# Patient Record
Sex: Female | Born: 1960 | Race: White | Hispanic: No | Marital: Single | Smoking: Current every day smoker
Health system: Southern US, Community
[De-identification: ages and names within clinical notes are randomized; demographics above are authoritative.]

## PROBLEM LIST (undated history)

## (undated) DIAGNOSIS — I2510 Atherosclerotic heart disease of native coronary artery without angina pectoris: Secondary | ICD-10-CM

## (undated) DIAGNOSIS — G709 Myoneural disorder, unspecified: Secondary | ICD-10-CM

## (undated) DIAGNOSIS — M1990 Unspecified osteoarthritis, unspecified site: Secondary | ICD-10-CM

## (undated) DIAGNOSIS — M797 Fibromyalgia: Secondary | ICD-10-CM

## (undated) DIAGNOSIS — K861 Other chronic pancreatitis: Secondary | ICD-10-CM

## (undated) DIAGNOSIS — E785 Hyperlipidemia, unspecified: Secondary | ICD-10-CM

## (undated) DIAGNOSIS — F329 Major depressive disorder, single episode, unspecified: Secondary | ICD-10-CM

## (undated) DIAGNOSIS — K5790 Diverticulosis of intestine, part unspecified, without perforation or abscess without bleeding: Secondary | ICD-10-CM

## (undated) DIAGNOSIS — R319 Hematuria, unspecified: Secondary | ICD-10-CM

## (undated) DIAGNOSIS — K589 Irritable bowel syndrome without diarrhea: Secondary | ICD-10-CM

## (undated) DIAGNOSIS — K635 Polyp of colon: Secondary | ICD-10-CM

## (undated) DIAGNOSIS — F419 Anxiety disorder, unspecified: Secondary | ICD-10-CM

## (undated) DIAGNOSIS — F32A Depression, unspecified: Secondary | ICD-10-CM

## (undated) DIAGNOSIS — K219 Gastro-esophageal reflux disease without esophagitis: Secondary | ICD-10-CM

## (undated) HISTORY — PX: OOPHORECTOMY: SHX86

## (undated) HISTORY — DX: Depression, unspecified: F32.A

## (undated) HISTORY — DX: Myoneural disorder, unspecified: G70.9

## (undated) HISTORY — DX: Anxiety disorder, unspecified: F41.9

## (undated) HISTORY — DX: Unspecified osteoarthritis, unspecified site: M19.90

## (undated) HISTORY — PX: ABDOMINAL HYSTERECTOMY: SHX81

## (undated) HISTORY — DX: Other chronic pancreatitis: K86.1

## (undated) HISTORY — DX: Diverticulosis of intestine, part unspecified, without perforation or abscess without bleeding: K57.90

## (undated) HISTORY — DX: Hematuria, unspecified: R31.9

## (undated) HISTORY — DX: Hyperlipidemia, unspecified: E78.5

## (undated) HISTORY — DX: Polyp of colon: K63.5

## (undated) HISTORY — PX: APPENDECTOMY: SHX54

## (undated) HISTORY — DX: Irritable bowel syndrome, unspecified: K58.9

## (undated) HISTORY — PX: TONSILLECTOMY AND ADENOIDECTOMY: SHX28

## (undated) HISTORY — DX: Major depressive disorder, single episode, unspecified: F32.9

## (undated) HISTORY — DX: Atherosclerotic heart disease of native coronary artery without angina pectoris: I25.10

---

## 2005-02-05 ENCOUNTER — Ambulatory Visit: Payer: Self-pay | Admitting: Family Medicine

## 2008-02-25 ENCOUNTER — Ambulatory Visit: Payer: Self-pay

## 2009-11-16 ENCOUNTER — Ambulatory Visit: Payer: Self-pay

## 2009-11-23 ENCOUNTER — Ambulatory Visit: Payer: Self-pay

## 2010-06-06 ENCOUNTER — Ambulatory Visit: Payer: Self-pay

## 2010-07-12 DIAGNOSIS — G894 Chronic pain syndrome: Secondary | ICD-10-CM | POA: Insufficient documentation

## 2010-08-14 ENCOUNTER — Ambulatory Visit: Payer: Self-pay

## 2011-08-21 ENCOUNTER — Ambulatory Visit: Payer: Self-pay

## 2011-08-29 ENCOUNTER — Ambulatory Visit: Payer: Self-pay

## 2012-02-28 ENCOUNTER — Ambulatory Visit: Payer: Self-pay

## 2013-07-03 ENCOUNTER — Ambulatory Visit: Payer: Self-pay

## 2015-05-12 ENCOUNTER — Telehealth: Payer: Self-pay | Admitting: Family Medicine

## 2015-05-12 ENCOUNTER — Encounter: Payer: Self-pay | Admitting: Family Medicine

## 2015-05-12 ENCOUNTER — Ambulatory Visit: Payer: Self-pay | Admitting: Family Medicine

## 2015-05-12 ENCOUNTER — Ambulatory Visit (INDEPENDENT_AMBULATORY_CARE_PROVIDER_SITE_OTHER): Payer: Self-pay | Admitting: Family Medicine

## 2015-05-12 VITALS — BP 128/78 | HR 99 | Temp 98.1°F | Resp 16 | Ht 64.0 in | Wt 144.7 lb

## 2015-05-12 DIAGNOSIS — M545 Low back pain, unspecified: Secondary | ICD-10-CM | POA: Insufficient documentation

## 2015-05-12 DIAGNOSIS — G8929 Other chronic pain: Secondary | ICD-10-CM

## 2015-05-12 DIAGNOSIS — J358 Other chronic diseases of tonsils and adenoids: Secondary | ICD-10-CM

## 2015-05-12 DIAGNOSIS — F17210 Nicotine dependence, cigarettes, uncomplicated: Secondary | ICD-10-CM

## 2015-05-12 DIAGNOSIS — F411 Generalized anxiety disorder: Secondary | ICD-10-CM | POA: Insufficient documentation

## 2015-05-12 DIAGNOSIS — M25559 Pain in unspecified hip: Secondary | ICD-10-CM

## 2015-05-12 DIAGNOSIS — M797 Fibromyalgia: Secondary | ICD-10-CM | POA: Insufficient documentation

## 2015-05-12 MED ORDER — TIZANIDINE HCL 4 MG PO TABS: 4.0000 mg | ORAL_TABLET | Freq: Three times a day (TID) | ORAL | Status: DC | PRN

## 2015-05-12 MED ORDER — GABAPENTIN 300 MG PO CAPS: 300.0000 mg | ORAL_CAPSULE | Freq: Three times a day (TID) | ORAL | Status: DC

## 2015-05-12 MED ORDER — HYDROCODONE-ACETAMINOPHEN 7.5-325 MG PO TABS: 1.0000 | ORAL_TABLET | Freq: Three times a day (TID) | ORAL | Status: DC | PRN

## 2015-05-12 MED ORDER — ALPRAZOLAM 1 MG PO TABS: 1.0000 mg | ORAL_TABLET | Freq: Three times a day (TID) | ORAL | Status: DC | PRN

## 2015-05-12 NOTE — Telephone Encounter (Signed)
Pt is asking if you are getting her refill for her pain medication

## 2015-05-12 NOTE — Progress Notes (Signed)
Name: Patricia Rollins   MRN: [id number]    DOB: September 09, 1961   Date:05/12/2015       Progress Note  Subjective  Chief Complaint  Chief Complaint  Patient presents with  . Medication Refill    patient states she needs all (4) of her medication refilled. patient needs enough for 31 days in July & August.    HPI  Joint/Muscle Pain: Patient complains of arthralgias for which has been present for several years. Pain is located in lower back, is described as aching, shooting and throbbing, and is constant .  Associated symptoms include: decreased range of motion.  The patient has been using hydrocodone-aceto 7.5-325mg , tizanidine 4mg , gabapentin 300mg .  Related to injury:   not applicable. Here today for refills as PCP out of office.   Anxiety: Patient complains of anxiety disorder and panic attacks.  She has the following symptoms: difficulty concentrating, fatigue, feelings of losing control, insomnia, irritable, palpitations, racing thoughts, shortness of breath. Onset of symptoms was approximately several years ago, stable since that time. She denies current suicidal and homicidal ideation. Family history significant for anxiety and depression.Possible organic causes contributing are: none. Risk factors: previous episode of depression Previous treatment includes Xanax and medication.  She complains of the following side effects from the treatment: none.   Smoking: Trying to quit for the 4th time now, signed up with 11-1798 Quit Smoking hotline, has nicotine lozenges and patches ready. Motivated to stop smoking as she sees her friends' health deteriorating due to smoking, plus cost benefit, plus she does not want her grandchildren to know she smokes.  Tonsil white spot: For many months has had white spots back of throat, no pain, no coughing, no hoarseness.    Past Medical History  Diagnosis Date  . Anxiety   . Depression   . Neuromuscular disorder   . Arthritis     patient has bilateral bursitis  in hips    Past Surgical History  Procedure Laterality Date  . Abdominal hysterectomy    . Tonsillectomy and adenoidectomy Bilateral     History reviewed. No pertinent family history.  History   Social History  . Marital Status: Single    Spouse Name: N/A  . Number of Children: N/A  . Years of Education: N/A   Occupational History  . Not on file.   Social History Main Topics  . Smoking status: Current Every Day Smoker -- 1.00 packs/day    Types: Cigarettes  . Smokeless tobacco: Not on file  . Alcohol Use: No  . Drug Use: No  . Sexual Activity: No   Other Topics Concern  . Not on file   Social History Narrative  . No narrative on file     Current outpatient prescriptions:  .  ALPRAZolam (XANAX) 1 MG tablet, Take 1 tablet (1 mg total) by mouth 3 (three) times daily as needed for anxiety., Disp: 90 tablet, Rfl: 2 .  gabapentin (NEURONTIN) 300 MG capsule, Take 1 capsule (300 mg total) by mouth 3 (three) times daily., Disp: 90 capsule, Rfl: 2 .  HYDROcodone-acetaminophen (NORCO) 7.5-325 MG per tablet, Take 1 tablet by mouth every 8 (eight) hours as needed., Disp: 93 tablet, Rfl: 0 .  tiZANidine (ZANAFLEX) 4 MG tablet, Take 1 tablet (4 mg total) by mouth every 8 (eight) hours as needed., Disp: 90 tablet, Rfl: 2 .  HYDROcodone-acetaminophen (NORCO) 7.5-325 MG per tablet, Take 1 tablet by mouth every 8 (eight) hours as needed for moderate pain., Disp: 93  tablet, Rfl: 0 .  HYDROcodone-acetaminophen (NORCO) 7.5-325 MG per tablet, Take 1 tablet by mouth every 8 (eight) hours as needed for moderate pain., Disp: 90 tablet, Rfl: 0  Allergies  Allergen Reactions  . Sulfa Antibiotics Nausea Only     ROS  CONSTITUTIONAL: No significant weight changes, fever, chills, weakness or fatigue.  HEENT:  - Eyes: No visual changes.  - Ears: No auditory changes. No pain.  - Nose: No sneezing, congestion, runny nose. - Throat: No sore throat. No changes in swallowing. SKIN: No rash or  itching.  CARDIOVASCULAR: No chest pain, chest pressure or chest discomfort. No palpitations or edema.  RESPIRATORY: No shortness of breath, cough or sputum.  GASTROINTESTINAL: No anorexia, nausea, vomiting. No changes in bowel habits. No abdominal pain or blood.  GENITOURINARY: No dysuria. No frequency. No discharge.  NEUROLOGICAL: No headache, dizziness, syncope, paralysis, ataxia, numbness or tingling in the extremities. No memory changes. No change in bowel or bladder control.  MUSCULOSKELETAL: Yes joint pain. No muscle pain. HEMATOLOGIC: No anemia, bleeding or bruising.  LYMPHATICS: No enlarged lymph nodes.  PSYCHIATRIC: No change in mood. No change in sleep pattern.  ENDOCRINOLOGIC: No reports of sweating, cold or heat intolerance. No polyuria or polydipsia.   Objective  Filed Vitals:   05/12/15 1511  BP: 128/78  Pulse: 99  Temp: 98.1 F (36.7 C)  TempSrc: Oral  Resp: 16  Height: 5\' 4"  (1.626 m)  Weight: 144 lb 11.2 oz (65.635 kg)  SpO2: 97%   Body mass index is 24.83 kg/(m^2).  Physical Exam  Constitutional: Patient appears well-developed and well-nourished. In no distress.  HEENT:  - Head: Normocephalic and atraumatic.  - Ears: Bilateral TMs gray, no erythema or effusion - Nose: Nasal mucosa moist - Mouth/Throat: Oropharynx is clear and moist. No tonsillar hypertrophy or erythema. Some tonsilar stones present. No post nasal drainage.  - Eyes: Conjunctivae clear, EOM movements normal. PERRLA. No scleral icterus.  Neck: Normal range of motion. Neck supple. No JVD present. No thyromegaly present.  Cardiovascular: Normal rate, regular rhythm and normal heart sounds.  No murmur heard.  Pulmonary/Chest: Effort normal and breath sounds normal. No respiratory distress. Musculoskeletal: Normal range of motion bilateral UE and LE, no joint effusions. Lumbar spine with no palpable step off with some paraspinal muscle tenderness. Peripheral vascular: Bilateral LE no  edema. Neurological: CN II-XII grossly intact with no focal deficits. Alert and oriented to person, place, and time. Coordination, balance, strength, speech and gait are normal.  Skin: Skin is warm and dry. No rash noted. No erythema.  Psychiatric: Patient has a normal mood and affect. Behavior is normal in office today. Judgment and thought content normal in office today.   Assessment & Plan  1. Chronic lumbar pain Stable exam and findings. Refilled her medication. She was unable to provide sufficient urine for UDS.  - tiZANidine (ZANAFLEX) 4 MG tablet; Take 1 tablet (4 mg total) by mouth every 8 (eight) hours as needed.  Dispense: 90 tablet; Refill: 2 - gabapentin (NEURONTIN) 300 MG capsule; Take 1 capsule (300 mg total) by mouth 3 (three) times daily.  Dispense: 90 capsule; Refill: 2 - HYDROcodone-acetaminophen (NORCO) 7.5-325 MG per tablet; Take 1 tablet by mouth every 8 (eight) hours as needed.  Dispense: 93 tablet; Refill: 0 - HYDROcodone-acetaminophen (NORCO) 7.5-325 MG per tablet; Take 1 tablet by mouth every 8 (eight) hours as needed for moderate pain.  Dispense: 93 tablet; Refill: 0 - HYDROcodone-acetaminophen (NORCO) 7.5-325 MG per tablet; Take 1  tablet by mouth every 8 (eight) hours as needed for moderate pain.  Dispense: 90 tablet; Refill: 0  2. Fibromyalgia Stable findings, refilled medications.  - tiZANidine (ZANAFLEX) 4 MG tablet; Take 1 tablet (4 mg total) by mouth every 8 (eight) hours as needed.  Dispense: 90 tablet; Refill: 2 - gabapentin (NEURONTIN) 300 MG capsule; Take 1 capsule (300 mg total) by mouth 3 (three) times daily.  Dispense: 90 capsule; Refill: 2 - HYDROcodone-acetaminophen (NORCO) 7.5-325 MG per tablet; Take 1 tablet by mouth every 8 (eight) hours as needed.  Dispense: 93 tablet; Refill: 0 - HYDROcodone-acetaminophen (NORCO) 7.5-325 MG per tablet; Take 1 tablet by mouth every 8 (eight) hours as needed for moderate pain.  Dispense: 93 tablet; Refill: 0 -  HYDROcodone-acetaminophen (NORCO) 7.5-325 MG per tablet; Take 1 tablet by mouth every 8 (eight) hours as needed for moderate pain.  Dispense: 90 tablet; Refill: 0  3. Generalized anxiety disorder Stable findings, refilled medication.  - ALPRAZolam (XANAX) 1 MG tablet; Take 1 tablet (1 mg total) by mouth 3 (three) times daily as needed for anxiety.  Dispense: 90 tablet; Refill: 2  4. Chronic hip pain, unspecified laterality Stable findings, refilled medication.  - tiZANidine (ZANAFLEX) 4 MG tablet; Take 1 tablet (4 mg total) by mouth every 8 (eight) hours as needed.  Dispense: 90 tablet; Refill: 2 - gabapentin (NEURONTIN) 300 MG capsule; Take 1 capsule (300 mg total) by mouth 3 (three) times daily.  Dispense: 90 capsule; Refill: 2 - HYDROcodone-acetaminophen (NORCO) 7.5-325 MG per tablet; Take 1 tablet by mouth every 8 (eight) hours as needed.  Dispense: 93 tablet; Refill: 0 - HYDROcodone-acetaminophen (NORCO) 7.5-325 MG per tablet; Take 1 tablet by mouth every 8 (eight) hours as needed for moderate pain.  Dispense: 93 tablet; Refill: 0 - HYDROcodone-acetaminophen (NORCO) 7.5-325 MG per tablet; Take 1 tablet by mouth every 8 (eight) hours as needed for moderate pain.  Dispense: 90 tablet; Refill: 0  5. Moderate cigarette smoker (10-19 per day) The patient has been counseled on smoking cessation benefits, goals, strategies and available over the counter and prescription medications that may help them in their efforts.  Options discussed include Nicoderm patches, Wellbutrin and Chantix.  The patient voices understanding their increased risk of cardiovascular and pulmonary diseases with continued use of tobacco products.   6. Tonsil stone Monitor symptoms.

## 2015-07-17 ENCOUNTER — Emergency Department
Admission: EM | Admit: 2015-07-17 | Discharge: 2015-07-17 | Disposition: A | Discharge status: Home / Self Care | Payer: Self-pay | Attending: Emergency Medicine | Admitting: Emergency Medicine

## 2015-07-17 ENCOUNTER — Emergency Department: Payer: Self-pay

## 2015-07-17 DIAGNOSIS — Y9289 Other specified places as the place of occurrence of the external cause: Secondary | ICD-10-CM | POA: Insufficient documentation

## 2015-07-17 DIAGNOSIS — S40012A Contusion of left shoulder, initial encounter: Secondary | ICD-10-CM | POA: Insufficient documentation

## 2015-07-17 DIAGNOSIS — S42032A Displaced fracture of lateral end of left clavicle, initial encounter for closed fracture: Secondary | ICD-10-CM

## 2015-07-17 DIAGNOSIS — S42035A Nondisplaced fracture of lateral end of left clavicle, initial encounter for closed fracture: Secondary | ICD-10-CM | POA: Insufficient documentation

## 2015-07-17 DIAGNOSIS — Y998 Other external cause status: Secondary | ICD-10-CM | POA: Insufficient documentation

## 2015-07-17 DIAGNOSIS — W1839XA Other fall on same level, initial encounter: Secondary | ICD-10-CM | POA: Insufficient documentation

## 2015-07-17 DIAGNOSIS — Z79899 Other long term (current) drug therapy: Secondary | ICD-10-CM | POA: Insufficient documentation

## 2015-07-17 DIAGNOSIS — Y9389 Activity, other specified: Secondary | ICD-10-CM | POA: Insufficient documentation

## 2015-07-17 DIAGNOSIS — S20212A Contusion of left front wall of thorax, initial encounter: Secondary | ICD-10-CM | POA: Insufficient documentation

## 2015-07-17 DIAGNOSIS — Z720 Tobacco use: Secondary | ICD-10-CM | POA: Insufficient documentation

## 2015-07-17 HISTORY — DX: Fibromyalgia: M79.7

## 2015-07-17 MED ORDER — OXYCODONE-ACETAMINOPHEN 5-325 MG PO TABS
1.0000 | ORAL_TABLET | Freq: Once | ORAL | Status: AC
Start: 2015-07-17 — End: 2015-07-17
  Administered 2015-07-17: 1 via ORAL
  Filled 2015-07-17: qty 1

## 2015-07-17 MED ORDER — PROMETHAZINE HCL 12.5 MG PO TABS: 12.5000 mg | ORAL_TABLET | Freq: Four times a day (QID) | ORAL | Status: DC | PRN

## 2015-07-17 MED ORDER — PROMETHAZINE HCL 25 MG PO TABS
25.0000 mg | ORAL_TABLET | Freq: Once | ORAL | Status: AC
  Administered 2015-07-17: 25 mg via ORAL
  Filled 2015-07-17: qty 1

## 2015-07-17 MED ORDER — OXYCODONE-ACETAMINOPHEN 5-325 MG PO TABS: 1.0000 | ORAL_TABLET | Freq: Four times a day (QID) | ORAL | Status: DC | PRN

## 2015-07-17 NOTE — ED Notes (Signed)
MD at bedside for eval.

## 2015-07-17 NOTE — ED Provider Notes (Signed)
[facility] Emergency Department Provider Note  ____________________________________________  Time seen: Approximately 244 AM  I have reviewed the triage vital signs and the nursing notes.   HISTORY  Chief Complaint Shoulder Pain    HPI Patricia Rollins is a 54 y.o. female who fell on her shoulder on Wednesday. The patient reports that since the fall she has been having a lot of pain in her shoulder and it has not been getting better. The patient reports that she is here because she feels as though she may have dislocated her shoulder. The patient reports she has been putting ice on her shoulder and had it in the sling as well as taking some ibuprofen but it has not been helping. The patient feels as though she cannot move her left arm because of pain and it hurts trying to hold things. The patient reports he was moving a chair when she fell. Her pain is a 10 out of 10 in intensity. She also reports pain at the top of her shoulder near her shoulder blade. She reports that she hit her head a little bit but it was not a very strong impact she only noticed it days after the injury when she felt a knot on her head. The patient denies any loss of consciousness with this as well.   Past Medical History  Diagnosis Date  . Anxiety   . Depression   . Neuromuscular disorder   . Arthritis     patient has bilateral bursitis in hips  . Fibromyalgia   . Anxiety     Patient Active Problem List   Diagnosis Date Noted  . Chronic lumbar pain 05/12/2015  . Fibromyalgia 05/12/2015  . Generalized anxiety disorder 05/12/2015    Past Surgical History  Procedure Laterality Date  . Abdominal hysterectomy    . Tonsillectomy and adenoidectomy Bilateral   . Oophorectomy    . Appendectomy      Current Outpatient Rx  Name  Route  Sig  Dispense  Refill  . ALPRAZolam (XANAX) 1 MG tablet   Oral   Take 1 tablet (1 mg total) by mouth 3 (three) times daily as needed for anxiety.    90 tablet   2   . gabapentin (NEURONTIN) 300 MG capsule   Oral   Take 1 capsule (300 mg total) by mouth 3 (three) times daily.   90 capsule   2   . HYDROcodone-acetaminophen (NORCO) 7.5-325 MG per tablet   Oral   Take 1 tablet by mouth every 8 (eight) hours as needed.   93 tablet   0     Refill 05/13/15   . tiZANidine (ZANAFLEX) 4 MG tablet   Oral   Take 1 tablet (4 mg total) by mouth every 8 (eight) hours as needed.   90 tablet   2   . HYDROcodone-acetaminophen (NORCO) 7.5-325 MG per tablet   Oral   Take 1 tablet by mouth every 8 (eight) hours as needed for moderate pain.   93 tablet   0     Refill 06/13/15   . HYDROcodone-acetaminophen (NORCO) 7.5-325 MG per tablet   Oral   Take 1 tablet by mouth every 8 (eight) hours as needed for moderate pain.   90 tablet   0     Refill 07/14/15   . oxyCODONE-acetaminophen (ROXICET) 5-325 MG per tablet   Oral   Take 1 tablet by mouth every 6 (six) hours as needed.   12 tablet  0   . promethazine (PHENERGAN) 12.5 MG tablet   Oral   Take 1 tablet (12.5 mg total) by mouth every 6 (six) hours as needed for nausea or vomiting.   12 tablet   0     Allergies Sulfa antibiotics and Prozac   History reviewed. No pertinent family history.  Social History Social History  Substance Use Topics  . Smoking status: Current Every Day Smoker -- 1.00 packs/day    Types: Cigarettes  . Smokeless tobacco: None  . Alcohol Use: None    Review of Systems Constitutional: No fever/chills Eyes: No visual changes. ENT: No sore throat. Cardiovascular: Denies chest pain. Respiratory: Denies shortness of breath. Gastrointestinal: No abdominal pain.  No nausea, no vomiting.  No diarrhea.  No constipation. Genitourinary: Negative for dysuria. Musculoskeletal: Left shoulder pain Skin: Negative for rash. Neurological: Negative for headaches, focal weakness or numbness.  10-point ROS otherwise  negative.  ____________________________________________   PHYSICAL EXAM:  VITAL SIGNS: ED Triage Vitals  Enc Vitals Group     BP 07/17/15 0130 96/63 mmHg     Pulse Rate 07/17/15 0130 82     Resp 07/17/15 0130 16     Temp 07/17/15 0130 97.6 F (36.4 C)     Temp Source 07/17/15 0130 Oral     SpO2 07/17/15 0130 96 %     Weight 07/17/15 0130 145 lb (65.772 kg)     Height 07/17/15 0130 5\' 4"  (1.626 m)     Head Cir --      Peak Flow --      Pain Score 07/17/15 0131 10     Pain Loc --      Pain Edu? --      Excl. in [place]? --     Constitutional: Alert and oriented. Well appearing and in moderate distress. Eyes: Conjunctivae are normal. PERRL. EOMI. Head: Atraumatic. Nose: No congestion/rhinnorhea. Mouth/Throat: Mucous membranes are moist.  Oropharynx non-erythematous. Cardiovascular: Normal rate, regular rhythm. Grossly normal heart sounds.  Good peripheral circulation. Respiratory: Normal respiratory effort.  No retractions. Lungs CTAB. Gastrointestinal: Soft and nontender. No distention.  Musculoskeletal: Pain to left shoulder to palpation and with range of motion. Patient has some bruising over her shoulder and her left anterior chest. Neurologic:  Normal speech and language.  Skin:  Bruising of left shoulder and left anterior chest wall. Psychiatric: Mood and affect are normal.   ____________________________________________   LABS (all labs ordered are listed, but only abnormal results are displayed)  Labs Reviewed - No data to display ____________________________________________  EKG  None ____________________________________________  RADIOLOGY  Left shoulder x-ray: Acute comminuted nondisplaced distal clavicle fracture no dislocation ____________________________________________   PROCEDURES  Procedure(s) performed: None  Critical Care performed: No  ____________________________________________   INITIAL IMPRESSION / ASSESSMENT AND PLAN / ED  COURSE  Pertinent labs & imaging results that were available during my care of the patient were reviewed by me and considered in my medical decision making (see chart for details).  The patient was placed in a left shoulder immobilizer and given a dose of Percocet and Phenergan. I informed the patient that she does need to follow up with orthopedic surgery. She reports that she does not have insurance or she is unsure if she can follow-up but I told her that she may need surgery and is important for her to follow up. Otherwise the patient has no further complaints she has good strength in her left hand. The patient be discharged home to follow-up with  orthopedic surgery. ____________________________________________   FINAL CLINICAL IMPRESSION(S) / ED DIAGNOSES  Final diagnoses:  Closed fracture of distal clavicle, left, initial encounter      Loney Hering, MD 07/17/15 [phone]

## 2015-07-17 NOTE — ED Notes (Signed)
Patient reports that fell while moving a chair and landed on her shoulder.  Patient reports pain as a "10" and has been taking 200 mg Advil every 4 hours for pain.  Reports that pain is less when arm in sling but movement makes pain increase.

## 2015-07-17 NOTE — Discharge Instructions (Signed)
Clavicle Fracture The clavicle, also called the collarbone, is the long bone that connects your shoulder to your rib cage. You can feel your collarbone at the top of your shoulders and rib cage. A clavicle fracture is a broken clavicle. It is a common injury that can happen at any age.  CAUSES Common causes of a clavicle fracture include:  A direct blow to your shoulder.  A car accident.  A fall, especially if you try to break your fall with an outstretched arm. RISK FACTORS You may be at increased risk if:  You are younger than 25 years or older than 2 years. Most clavicle fractures happen to people who are younger than 25 years.  You are a female.  You play contact sports. SIGNS AND SYMPTOMS A fractured clavicle is painful. It also makes it hard to move your arm. Other signs and symptoms may include:  A shoulder that drops downward and forward.  Pain when trying to lift your shoulder.  Bruising, swelling, and tenderness over your clavicle.  A grinding noise when you try to move your shoulder.  A bump over your clavicle. DIAGNOSIS Your health care provider can usually diagnose a clavicle fracture by asking about your injury and examining your shoulder and clavicle. He or she may take an X-ray to determine the position of your clavicle. TREATMENT Treatment depends on the position of your clavicle after the fracture:  If the broken ends of the bone are not out of place, your health care provider may put your arm in a sling or wrap a support bandage around your chest (figure-of-eight wrap).  If the broken ends of the bone are out of place, you may need surgery. Surgery may involve placing screws, pins, or plates to keep your clavicle stable while it heals. Healing may take about 3 months. When your health care provider thinks your fracture has healed enough, you may have to do physical therapy to regain normal movement and build up your arm strength. HOME CARE INSTRUCTIONS    Apply ice to the injured area:  Put ice in a plastic bag.  Place a towel between your skin and the bag.  Leave the ice on for 20 minutes, 2-3 times a day.  If you have a wrap or splint:  Wear it all the time, and remove it only to take a bath or shower.  When you bathe or shower, keep your shoulder in the same position as when the sling or wrap is on.  Do not lift your arm.  If you have a figure-of-eight wrap:  Another person must tighten it every day.  It should be tight enough to hold your shoulders back.  Allow enough room to place your index finger between your body and the strap.  Loosen the wrap immediately if you feel numbness or tingling in your hands.  Only take medicines as directed by your health care provider.  Avoid activities that make the injury or pain worse for 4-6 weeks after surgery.  Keep all follow-up appointments. SEEK MEDICAL CARE IF:  Your medicine is not helping to relieve pain and swelling. SEEK IMMEDIATE MEDICAL CARE IF:  Your arm is numb, cold, or pale, even when the splint is loose. MAKE SURE YOU:   Understand these instructions.  Will watch your condition.  Will get help right away if you are not doing well or get worse. Document Released: 08/08/2005 Document Revised: 11/03/2013 Document Reviewed: 09/21/2013 [facility] Patient Information 2015 [place], [place]. This information is  not intended to replace advice given to you by your health care provider. Make sure you discuss any questions you have with your health care provider.  Clavicle Fracture (Distal End) with Rehab Distal clavicle fractures are breaks in the collarbone (clavicle) that occur in the outer third portion of the bone, near the joint between the collarbone and one of the shoulder bones (acromion). These breaks (fractures) may be complete or incomplete. If the fracture extends into the joint at the top of the shoulder, it may also cause damage to the ligaments there  (acromioclavicular and coracoclavicular). These two ligaments are responsible for attaching the collarbone to the shoulder bone. SYMPTOMS   Pain, tenderness, and swelling on top of the shoulder.  Visible deformity or bump over the fracture site, if the fracture is complete and the bone fragments separate enough to distort the appearance of the top of the shoulder.  Bruising (contusion) at the site of injury (usually within 48 hours).  Loss of strength, or pain with use of the affected arm.  Sometimes, numbness or coldness in the shoulder and arm on the affected side if the blood supply is impaired.  Uncommonly, shortness of breath or difficulty breathing. CAUSES  Distal clavicle fractures are usually caused by direct hit (trauma) to the area of injury. The injury may also occur from indirect trauma, such as falling on an outstretched hand (uncommon). RISK INCREASES WITH:  Sports that require contact or collision (football, soccer, hockey, rugby).  Sports with high risk of falling on the shoulder (rodeo riding, mountain bike riding, cycling).  Previous shoulder injury.  Improperly fitted or padded protective equipment.  History of bone or joint disease (osteoporosis, bone tumors). PREVENTION  Warm up and stretch properly before activity.  Maintain physical fitness:  Strength, flexibility, and endurance.  Cardiovascular fitness.  Wear properly fitted and padded protective equipment.  Learn and use proper technique, and have a coach correct improper technique (including falling). PROGNOSIS  If treated properly, distal clavicle fractures usually can be expected to heal. However, surgery may be needed.  RELATED COMPLICATIONS   Pressure on or injury to nearby nerves, ligaments, tendons, muscles, blood vessels, or other tissues.  Weakness and fatigue of the arm or shoulder (uncommon).  Fracture fails to heal (nonunion).  Fracture heals in improper position  (malunion).  Arthritis, pain, and inflammation of the acromioclavicular (AC) joint.  Longer healing time and vulnerable to recurring injury, if usual activities are resumed too soon.  Excessive scar tissue at the fracture site, including excessive bone formation, causing pressure on nerves and blood vessels in the neck or armpit. This may lead to pain, numbness, and tingling in the neck, shoulder, arms, and hands.  Infection if the bone breaks through the skin (open fracture), or at the incision if surgery is performed.  Persistent bump (prominence) at the fracture site.  Vulnerable to repeated collarbone injury. TREATMENT  Treatment first involves the use of ice, medicine, and compressive bandages to reduce pain and inflammation. The shoulder should be immediately restrained. It is important to have an orthopedic specialist look at the fracture to determine if surgery is needed to realign the bones if the fracture is out of alignment. Surgery involves repositioning the bones and fixing them in place with screws, pins, and plates. It may be necessary to remove the hardware after the fracture heals. After the fracture heals, it is important to complete stretching and strengthening exercises in order to regain strength and a full range of motion before  you are able to return to sports. These exercises may be completed at home or with a therapist. If surgery is required, return to sports can be expected in 2 to 6 months.  MEDICATION   If pain medicine is needed, nonsteroidal anti-inflammatory medicines (aspirin and ibuprofen), or other minor pain relievers (acetaminophen), are often advised.  Do not take pain medicine for 7 days before surgery.  Prescription pain relievers may be given if your caregiver thinks they are needed. Use only as directed and only as much as you need. COLD THERAPY   Cold treatment (icing) should be applied for 10 to 15 minutes every 2 to 3 hours for inflammation and  pain, and immediately after activity that aggravates your symptoms. Use ice packs or an ice massage. SEEK MEDICAL CARE IF:   Pain, swelling, or bruising gets worse despite treatment.  You experience pain, numbness, or coldness in the arm.  Blue, gray, or dark color appears in the hand or fingernails.  You have increased pain, swelling, or drainage of fluids in the affected area.  You experience signs of infection: increased pain, swelling, drainage of fluids, fever, or a general ill feeling.  New, unexplained symptoms develop. (Drugs used in treatment may produce side effects.)

## 2015-08-11 ENCOUNTER — Other Ambulatory Visit: Payer: Self-pay

## 2015-08-11 ENCOUNTER — Telehealth: Payer: Self-pay | Admitting: Family Medicine

## 2015-08-11 ENCOUNTER — Ambulatory Visit: Payer: Self-pay | Admitting: Family Medicine

## 2015-08-11 DIAGNOSIS — M797 Fibromyalgia: Secondary | ICD-10-CM

## 2015-08-11 DIAGNOSIS — G8929 Other chronic pain: Secondary | ICD-10-CM

## 2015-08-11 DIAGNOSIS — F411 Generalized anxiety disorder: Secondary | ICD-10-CM

## 2015-08-11 DIAGNOSIS — M25559 Pain in unspecified hip: Secondary | ICD-10-CM

## 2015-08-11 DIAGNOSIS — M545 Low back pain: Secondary | ICD-10-CM

## 2015-08-11 MED ORDER — ALPRAZOLAM 1 MG PO TABS: 1.0000 mg | ORAL_TABLET | Freq: Three times a day (TID) | ORAL | Status: DC | PRN

## 2015-08-11 MED ORDER — GABAPENTIN 300 MG PO CAPS: 300.0000 mg | ORAL_CAPSULE | Freq: Three times a day (TID) | ORAL | Status: DC

## 2015-08-11 NOTE — Telephone Encounter (Signed)
Can you write enough to cover pt until Monday for xanax,gabapentin, and her hydrocodone?

## 2015-08-11 NOTE — Telephone Encounter (Signed)
Pt needs refill on his pain medication. She is completely out and we rescheduled her an appt for Monday 08/15/15.

## 2015-08-15 ENCOUNTER — Encounter: Payer: Self-pay | Admitting: Family Medicine

## 2015-08-15 ENCOUNTER — Ambulatory Visit (INDEPENDENT_AMBULATORY_CARE_PROVIDER_SITE_OTHER): Payer: Self-pay | Admitting: Family Medicine

## 2015-08-15 VITALS — BP 128/76 | HR 94 | Temp 98.6°F | Resp 16 | Ht 64.0 in | Wt 145.4 lb

## 2015-08-15 DIAGNOSIS — G8929 Other chronic pain: Secondary | ICD-10-CM | POA: Insufficient documentation

## 2015-08-15 DIAGNOSIS — M797 Fibromyalgia: Secondary | ICD-10-CM

## 2015-08-15 DIAGNOSIS — M545 Low back pain, unspecified: Secondary | ICD-10-CM | POA: Insufficient documentation

## 2015-08-15 DIAGNOSIS — M5440 Lumbago with sciatica, unspecified side: Secondary | ICD-10-CM

## 2015-08-15 DIAGNOSIS — G894 Chronic pain syndrome: Secondary | ICD-10-CM

## 2015-08-15 DIAGNOSIS — F411 Generalized anxiety disorder: Secondary | ICD-10-CM

## 2015-08-15 DIAGNOSIS — L259 Unspecified contact dermatitis, unspecified cause: Secondary | ICD-10-CM | POA: Insufficient documentation

## 2015-08-15 DIAGNOSIS — N959 Unspecified menopausal and perimenopausal disorder: Secondary | ICD-10-CM | POA: Insufficient documentation

## 2015-08-15 DIAGNOSIS — N951 Menopausal and female climacteric states: Secondary | ICD-10-CM | POA: Insufficient documentation

## 2015-08-15 DIAGNOSIS — M25559 Pain in unspecified hip: Secondary | ICD-10-CM

## 2015-08-15 MED ORDER — HYDROCODONE-ACETAMINOPHEN 7.5-325 MG PO TABS: 1.0000 | ORAL_TABLET | Freq: Three times a day (TID) | ORAL | Status: DC | PRN

## 2015-08-15 MED ORDER — ALPRAZOLAM 1 MG PO TABS: 1.0000 mg | ORAL_TABLET | Freq: Three times a day (TID) | ORAL | Status: DC | PRN

## 2015-08-15 MED ORDER — TIZANIDINE HCL 4 MG PO TABS: 4.0000 mg | ORAL_TABLET | Freq: Three times a day (TID) | ORAL | Status: DC | PRN

## 2015-08-15 MED ORDER — GABAPENTIN 300 MG PO CAPS: 300.0000 mg | ORAL_CAPSULE | Freq: Three times a day (TID) | ORAL | Status: DC

## 2015-08-15 NOTE — Progress Notes (Signed)
Name: Patricia Rollins   MRN: [id number]    DOB: 1961-03-01   Date:08/15/2015       Progress Note  Subjective  Chief Complaint  Chief Complaint  Patient presents with  . Pain    pt here for pain med refills    HPI  Chronic pain  Since last visit here patient suffered a fracture of her clavicle which she fell while moving furniture. She was seen in emergency department and was given Percocet for a two-week period and told to hold her hydrocodone or Vicodin during that period of time. She is now back to her baseline is in need of her Vicodin refill.  Anxiety  Patient has a long-standing history of anxiety for which she is currently on alprazolam 1 mg 3 times a day and this is working a stable manner.    Past Medical History  Diagnosis Date  . Anxiety   . Depression   . Neuromuscular disorder ([place])   . Arthritis     patient has bilateral bursitis in hips  . Fibromyalgia   . Anxiety     Social History  Substance Use Topics  . Smoking status: Current Every Day Smoker -- 1.00 packs/day    Types: Cigarettes  . Smokeless tobacco: Not on file  . Alcohol Use: Not on file     Current outpatient prescriptions:  .  ALPRAZolam (XANAX) 1 MG tablet, Take 1 tablet (1 mg total) by mouth 3 (three) times daily as needed for anxiety., Disp: 21 tablet, Rfl: 0 .  gabapentin (NEURONTIN) 300 MG capsule, Take 1 capsule (300 mg total) by mouth 3 (three) times daily., Disp: 21 capsule, Rfl: 0 .  HYDROcodone-acetaminophen (NORCO) 7.5-325 MG per tablet, Take 1 tablet by mouth every 8 (eight) hours as needed., Disp: 93 tablet, Rfl: 0 .  HYDROcodone-acetaminophen (NORCO) 7.5-325 MG per tablet, Take 1 tablet by mouth every 8 (eight) hours as needed for moderate pain., Disp: 93 tablet, Rfl: 0 .  HYDROcodone-acetaminophen (NORCO) 7.5-325 MG per tablet, Take 1 tablet by mouth every 8 (eight) hours as needed for moderate pain., Disp: 90 tablet, Rfl: 0 .  oxyCODONE-acetaminophen (ROXICET) 5-325 MG per  tablet, Take 1 tablet by mouth every 6 (six) hours as needed., Disp: 12 tablet, Rfl: 0 .  promethazine (PHENERGAN) 12.5 MG tablet, Take 1 tablet (12.5 mg total) by mouth every 6 (six) hours as needed for nausea or vomiting., Disp: 12 tablet, Rfl: 0 .  tiZANidine (ZANAFLEX) 4 MG tablet, Take 1 tablet (4 mg total) by mouth every 8 (eight) hours as needed., Disp: 90 tablet, Rfl: 2  Allergies  Allergen Reactions  . Sulfa Antibiotics Nausea Only  . Prozac  [Fluoxetine]     Review of Systems  Constitutional: Negative.   Musculoskeletal: Positive for myalgias, back pain and joint pain.  Psychiatric/Behavioral: The patient is nervous/anxious and has insomnia.      Objective  Filed Vitals:   08/15/15 1546  BP: 128/76  Pulse: 94  Temp: 98.6 F (37 C)  Resp: 16  Height: 5\' 4"  (1.626 m)  Weight: 145 lb 6 oz (65.942 kg)  SpO2: 95%     Physical Exam  Constitutional: She is oriented to person, place, and time and well-developed, well-nourished, and in no distress.  HENT:  Head: Normocephalic.  Eyes: EOM are normal. Pupils are equal, round, and reactive to light.  Neck: Normal range of motion. No thyromegaly present.  Cardiovascular: Normal rate, regular rhythm and normal heart sounds.   No  murmur heard. Pulmonary/Chest: Effort normal and breath sounds normal.  Abdominal: Soft. Bowel sounds are normal.  Musculoskeletal: Normal range of motion. She exhibits tenderness. She exhibits no edema.  Tender lumbar area bilaterally  Neurological: She is alert and oriented to person, place, and time. No cranial nerve deficit. Gait normal.  Skin: Skin is warm and dry. No rash noted.  Psychiatric: Memory and affect normal.  Somewhat anxious and loquacious  Vitals reviewed.     Assessment & Plan  1. Chronic pain syndrome Worsened with recent traumatic injury with fall  2. Midline low back pain with sciatica, sciatica laterality unspecified Continues  3. Fibromyalgia Somewhat worsened  with fall - HYDROcodone-acetaminophen (NORCO) 7.5-325 MG tablet; Take 1 tablet by mouth every 8 (eight) hours as needed.  Dispense: 93 tablet; Refill: 0 - HYDROcodone-acetaminophen (NORCO) 7.5-325 MG tablet; Take 1 tablet by mouth every 8 (eight) hours as needed.  Dispense: 93 tablet; Refill: 0 - HYDROcodone-acetaminophen (NORCO) 7.5-325 MG tablet; Take 1 tablet by mouth every 8 (eight) hours as needed.  Dispense: 93 tablet; Refill: 0 - gabapentin (NEURONTIN) 300 MG capsule; Take 1 capsule (300 mg total) by mouth 3 (three) times daily.  Dispense: 93 capsule; Refill: 2 - tiZANidine (ZANAFLEX) 4 MG tablet; Take 1 tablet (4 mg total) by mouth every 8 (eight) hours as needed.  Dispense: 90 tablet; Refill: 2  4. Chronic lumbar pain Slightly worsened - HYDROcodone-acetaminophen (NORCO) 7.5-325 MG tablet; Take 1 tablet by mouth every 8 (eight) hours as needed.  Dispense: 93 tablet; Refill: 0 - HYDROcodone-acetaminophen (NORCO) 7.5-325 MG tablet; Take 1 tablet by mouth every 8 (eight) hours as needed.  Dispense: 93 tablet; Refill: 0 - HYDROcodone-acetaminophen (NORCO) 7.5-325 MG tablet; Take 1 tablet by mouth every 8 (eight) hours as needed.  Dispense: 93 tablet; Refill: 0 - gabapentin (NEURONTIN) 300 MG capsule; Take 1 capsule (300 mg total) by mouth 3 (three) times daily.  Dispense: 93 capsule; Refill: 2 - tiZANidine (ZANAFLEX) 4 MG tablet; Take 1 tablet (4 mg total) by mouth every 8 (eight) hours as needed.  Dispense: 90 tablet; Refill: 2  5. Chronic hip pain, unspecified laterality Stable stable - HYDROcodone-acetaminophen (NORCO) 7.5-325 MG tablet; Take 1 tablet by mouth every 8 (eight) hours as needed.  Dispense: 93 tablet; Refill: 0 - HYDROcodone-acetaminophen (NORCO) 7.5-325 MG tablet; Take 1 tablet by mouth every 8 (eight) hours as needed.  Dispense: 93 tablet; Refill: 0 - HYDROcodone-acetaminophen (NORCO) 7.5-325 MG tablet; Take 1 tablet by mouth every 8 (eight) hours as needed.  Dispense: 93  tablet; Refill: 0 - gabapentin (NEURONTIN) 300 MG capsule; Take 1 capsule (300 mg total) by mouth 3 (three) times daily.  Dispense: 93 capsule; Refill: 2 - tiZANidine (ZANAFLEX) 4 MG tablet; Take 1 tablet (4 mg total) by mouth every 8 (eight) hours as needed.  Dispense: 90 tablet; Refill: 2  6. Generalized anxiety disorder Stable - ALPRAZolam (XANAX) 1 MG tablet; Take 1 tablet (1 mg total) by mouth 3 (three) times daily as needed for anxiety.  Dispense: 93 tablet; Refill: 2

## 2015-08-16 ENCOUNTER — Encounter: Payer: Self-pay | Admitting: Family Medicine

## 2015-08-16 DIAGNOSIS — F17200 Nicotine dependence, unspecified, uncomplicated: Secondary | ICD-10-CM | POA: Insufficient documentation

## 2015-08-16 DIAGNOSIS — N951 Menopausal and female climacteric states: Secondary | ICD-10-CM | POA: Insufficient documentation

## 2015-08-16 DIAGNOSIS — M7062 Trochanteric bursitis, left hip: Secondary | ICD-10-CM

## 2015-08-16 DIAGNOSIS — M7061 Trochanteric bursitis, right hip: Secondary | ICD-10-CM | POA: Insufficient documentation

## 2015-09-27 ENCOUNTER — Telehealth: Payer: Self-pay | Admitting: Family Medicine

## 2015-09-27 DIAGNOSIS — M25559 Pain in unspecified hip: Secondary | ICD-10-CM

## 2015-09-27 DIAGNOSIS — M797 Fibromyalgia: Secondary | ICD-10-CM

## 2015-09-27 DIAGNOSIS — M545 Low back pain: Principal | ICD-10-CM

## 2015-09-27 DIAGNOSIS — G8929 Other chronic pain: Secondary | ICD-10-CM

## 2015-09-27 NOTE — Telephone Encounter (Signed)
Forgot to ask on last visit that her Gabapentin be increased. Please send to [facility]. Please return call [phone]

## 2015-09-29 MED ORDER — GABAPENTIN 300 MG PO CAPS: 300.0000 mg | ORAL_CAPSULE | Freq: Four times a day (QID) | ORAL | Status: DC

## 2015-09-29 NOTE — Telephone Encounter (Signed)
Patient stated she is currently taking Gabapentin 300 mg 1 in AM 1 in PM and 2 at night. This works for her and would like increase. Script given to patient

## 2015-11-08 ENCOUNTER — Ambulatory Visit (INDEPENDENT_AMBULATORY_CARE_PROVIDER_SITE_OTHER): Payer: Self-pay | Admitting: Family Medicine

## 2015-11-08 ENCOUNTER — Encounter: Payer: Self-pay | Admitting: Family Medicine

## 2015-11-08 VITALS — BP 112/60 | HR 88 | Temp 98.8°F | Resp 16 | Ht 64.0 in | Wt 149.2 lb

## 2015-11-08 DIAGNOSIS — M545 Low back pain: Secondary | ICD-10-CM

## 2015-11-08 DIAGNOSIS — M5417 Radiculopathy, lumbosacral region: Secondary | ICD-10-CM

## 2015-11-08 DIAGNOSIS — G8929 Other chronic pain: Secondary | ICD-10-CM

## 2015-11-08 DIAGNOSIS — M797 Fibromyalgia: Secondary | ICD-10-CM

## 2015-11-08 DIAGNOSIS — R928 Other abnormal and inconclusive findings on diagnostic imaging of breast: Secondary | ICD-10-CM | POA: Insufficient documentation

## 2015-11-08 DIAGNOSIS — F419 Anxiety disorder, unspecified: Secondary | ICD-10-CM

## 2015-11-08 DIAGNOSIS — F411 Generalized anxiety disorder: Secondary | ICD-10-CM

## 2015-11-08 DIAGNOSIS — M25559 Pain in unspecified hip: Secondary | ICD-10-CM

## 2015-11-08 MED ORDER — ALPRAZOLAM 1 MG PO TABS: 1.0000 mg | ORAL_TABLET | Freq: Three times a day (TID) | ORAL | Status: DC | PRN

## 2015-11-08 MED ORDER — ALPRAZOLAM 1 MG PO TABS: 1.0000 mg | ORAL_TABLET | Freq: Four times a day (QID) | ORAL | Status: DC

## 2015-11-08 MED ORDER — HYDROCODONE-ACETAMINOPHEN 7.5-325 MG PO TABS: 1.0000 | ORAL_TABLET | Freq: Three times a day (TID) | ORAL | Status: DC | PRN

## 2015-11-08 NOTE — Progress Notes (Signed)
Name: Patricia Rollins   MRN: [id number]    DOB: 16-Dec-1960   Date:11/08/2015       Progress Note  Subjective  Chief Complaint  Chief Complaint  Patient presents with  . Back Pain    Chronic Pain med refills  . Anxiety    Back Pain Pertinent negatives include no chest pain, dysuria, fever, headaches, tingling, weakness or weight loss.  Anxiety Presents for follow-up visit. Symptoms include dry mouth, insomnia and nervous/anxious behavior. Patient reports no chest pain, dizziness, nausea, palpitations or shortness of breath.      Chronic pain  Patient presents for follow-up of chronic pain syndrome. The pain score at worse is 10 /10  and at best is 6/10 with medication rest and home remedies.  There is no evidence of any extra dosage or issues of usage outside of the prescribed regimen.  Pain is exacerbated by changes in weather and by strenuous activities. There have been no recent activities to exacerbate the chronic pain. Concomitant medications include Vicodin and gabapentin .  Drug screen and medication contract have been renewed within the last 1 year.   Anxiety history of present illness  Long-standing history anxiety manifested by palpitations and sweaty palms racing heart racing thoughts. Also some insomnia associated. Xanax 1 mg 3 times a day is not holding her currently wishes to increase it to times per day. There is no history of any illicit drug usage and she is taking her medication as prescribed in the past. This no significant alcohol ingestion  Fibromyalgia  Patient complains of diffuse muscle pain and fatigue. This is particularly along the paraspinal area. She is currently on gabapentin and tizanidine for this. This is been present for several years.  Past Medical History  Diagnosis Date  . Anxiety   . Depression   . Neuromuscular disorder ([place])   . Arthritis     patient has bilateral bursitis in hips  . Fibromyalgia   . Anxiety     Social History   Substance Use Topics  . Smoking status: Current Every Day Smoker -- 1.00 packs/day    Types: Cigarettes  . Smokeless tobacco: Not on file  . Alcohol Use: Not on file     Current outpatient prescriptions:  .  ALPRAZolam (XANAX) 1 MG tablet, Take 1 tablet (1 mg total) by mouth 3 (three) times daily as needed for anxiety., Disp: 93 tablet, Rfl: 2 .  gabapentin (NEURONTIN) 300 MG capsule, Take 1 capsule (300 mg total) by mouth 4 (four) times daily., Disp: 120 capsule, Rfl: 2 .  HYDROcodone-acetaminophen (NORCO) 7.5-325 MG per tablet, Take 1 tablet by mouth every 8 (eight) hours as needed for moderate pain., Disp: 93 tablet, Rfl: 0 .  HYDROcodone-acetaminophen (NORCO) 7.5-325 MG per tablet, Take 1 tablet by mouth every 8 (eight) hours as needed for moderate pain., Disp: 90 tablet, Rfl: 0 .  HYDROcodone-acetaminophen (NORCO) 7.5-325 MG tablet, Take 1 tablet by mouth every 8 (eight) hours as needed., Disp: 93 tablet, Rfl: 0 .  HYDROcodone-acetaminophen (NORCO) 7.5-325 MG tablet, Take 1 tablet by mouth every 8 (eight) hours as needed., Disp: 93 tablet, Rfl: 0 .  HYDROcodone-acetaminophen (NORCO) 7.5-325 MG tablet, Take 1 tablet by mouth every 8 (eight) hours as needed., Disp: 93 tablet, Rfl: 0 .  promethazine (PHENERGAN) 12.5 MG tablet, Take 1 tablet (12.5 mg total) by mouth every 6 (six) hours as needed for nausea or vomiting., Disp: 12 tablet, Rfl: 0 .  tiZANidine (ZANAFLEX) 4 MG tablet, Take  1 tablet (4 mg total) by mouth every 8 (eight) hours as needed., Disp: 90 tablet, Rfl: 2  Allergies  Allergen Reactions  . Sulfa Antibiotics Nausea Only  . Prozac  [Fluoxetine]     Review of Systems  Constitutional: Positive for malaise/fatigue. Negative for fever, chills and weight loss.  HENT: Negative for congestion, hearing loss, sore throat and tinnitus.   Eyes: Negative for blurred vision, double vision and redness.  Respiratory: Negative for cough, hemoptysis and shortness of breath.    Cardiovascular: Negative for chest pain, palpitations, orthopnea, claudication and leg swelling.  Gastrointestinal: Negative for heartburn, nausea, vomiting, diarrhea, constipation and blood in stool.  Genitourinary: Negative for dysuria, urgency, frequency and hematuria.  Musculoskeletal: Positive for myalgias, back pain and joint pain. Negative for falls and neck pain.  Skin: Negative for itching.  Neurological: Negative for dizziness, tingling, tremors, focal weakness, seizures, loss of consciousness, weakness and headaches.  Endo/Heme/Allergies: Does not bruise/bleed easily.  Psychiatric/Behavioral: Negative for depression and substance abuse. The patient is nervous/anxious and has insomnia.      Objective  Filed Vitals:   11/08/15 1448  BP: 112/60  Pulse: 88  Temp: 98.8 F (37.1 C)  TempSrc: Oral  Resp: 16  Height: 5\' 4"  (1.626 m)  Weight: 149 lb 3.2 oz (67.677 kg)  SpO2: 95%     Physical Exam  Constitutional: She is oriented to person, place, and time and well-developed, well-nourished, and in no distress.  HENT:  Head: Normocephalic.  Eyes: EOM are normal. Pupils are equal, round, and reactive to light.  Neck: Normal range of motion. No thyromegaly present.  Cardiovascular: Normal rate, regular rhythm and normal heart sounds.   No murmur heard. Pulmonary/Chest: Effort normal and breath sounds normal.  Abdominal: Soft. Bowel sounds are normal.  Musculoskeletal: She exhibits no edema.  Tenderness along the lumbar muscles bilaterally. Also multiple trigger points palpable along the cervical and thoracic area and epitrochlearregion as well.  Neurological: She is alert and oriented to person, place, and time. No cranial nerve deficit. Gait normal.  Skin: Skin is warm and dry. No rash noted.  Psychiatric: Memory normal.  Patient appears anxious and is rather loquacious as usual      Assessment & Plan   1. Chronic pain Continue current regimen and recheck in 3  months  2. Lumbosacral neuritis No change  3. Fibromyalgia No change - HYDROcodone-acetaminophen (NORCO) 7.5-325 MG tablet; Take 1 tablet by mouth every 8 (eight) hours as needed for moderate pain.  Dispense: 93 tablet; Refill: 0 - HYDROcodone-acetaminophen (NORCO) 7.5-325 MG tablet; Take 1 tablet by mouth every 8 (eight) hours as needed for moderate pain.  Dispense: 93 tablet; Refill: 0 - HYDROcodone-acetaminophen (NORCO) 7.5-325 MG tablet; Take 1 tablet by mouth every 8 (eight) hours as needed for moderate pain.  Dispense: 93 tablet; Refill: 0  4. Anxiety As below  5. Generalized anxiety disorder Increase in 3-4 times per day - ALPRAZolam (XANAX) 1 MG tablet; Take 1 tablet (1 mg total) by mouth 3 (three) times daily as needed for anxiety.  Dispense: 93 tablet; Refill: 2  6. Chronic lumbar pain Per below - HYDROcodone-acetaminophen (NORCO) 7.5-325 MG tablet; Take 1 tablet by mouth every 8 (eight) hours as needed for moderate pain.  Dispense: 93 tablet; Refill: 0 - HYDROcodone-acetaminophen (NORCO) 7.5-325 MG tablet; Take 1 tablet by mouth every 8 (eight) hours as needed for moderate pain.  Dispense: 93 tablet; Refill: 0 - HYDROcodone-acetaminophen (NORCO) 7.5-325 MG tablet; Take 1 tablet by  mouth every 8 (eight) hours as needed for moderate pain.  Dispense: 93 tablet; Refill: 0  7. Chronic hip pain, unspecified laterality  - HYDROcodone-acetaminophen (NORCO) 7.5-325 MG tablet; Take 1 tablet by mouth every 8 (eight) hours as needed for moderate pain.  Dispense: 93 tablet; Refill: 0 - HYDROcodone-acetaminophen (NORCO) 7.5-325 MG tablet; Take 1 tablet by mouth every 8 (eight) hours as needed for moderate pain.  Dispense: 93 tablet; Refill: 0 - HYDROcodone-acetaminophen (NORCO) 7.5-325 MG tablet; Take 1 tablet by mouth every 8 (eight) hours as needed for moderate pain.  Dispense: 93 tablet; Refill: 0

## 2015-11-15 ENCOUNTER — Ambulatory Visit: Payer: Self-pay | Admitting: Family Medicine

## 2015-12-27 ENCOUNTER — Other Ambulatory Visit: Payer: Self-pay

## 2015-12-27 DIAGNOSIS — G8929 Other chronic pain: Secondary | ICD-10-CM

## 2015-12-27 MED ORDER — HYDROCODONE-ACETAMINOPHEN 7.5-325 MG PO TABS: 1.0000 | ORAL_TABLET | Freq: Three times a day (TID) | ORAL | Status: DC | PRN

## 2016-01-30 ENCOUNTER — Other Ambulatory Visit: Payer: Self-pay

## 2016-01-30 DIAGNOSIS — G8929 Other chronic pain: Secondary | ICD-10-CM

## 2016-01-30 MED ORDER — HYDROCODONE-ACETAMINOPHEN 7.5-325 MG PO TABS: 1.0000 | ORAL_TABLET | Freq: Three times a day (TID) | ORAL | Status: DC | PRN

## 2016-02-02 ENCOUNTER — Ambulatory Visit: Payer: Self-pay | Admitting: Family Medicine

## 2016-02-03 ENCOUNTER — Other Ambulatory Visit: Payer: Self-pay | Admitting: Family Medicine

## 2016-02-03 DIAGNOSIS — M797 Fibromyalgia: Secondary | ICD-10-CM

## 2016-02-03 DIAGNOSIS — G8929 Other chronic pain: Secondary | ICD-10-CM

## 2016-02-03 DIAGNOSIS — M545 Low back pain, unspecified: Secondary | ICD-10-CM

## 2016-02-03 DIAGNOSIS — M25559 Pain in unspecified hip: Secondary | ICD-10-CM

## 2016-02-03 MED ORDER — GABAPENTIN 300 MG PO CAPS: 300.0000 mg | ORAL_CAPSULE | Freq: Four times a day (QID) | ORAL | Status: DC

## 2016-02-03 MED ORDER — ALPRAZOLAM 1 MG PO TABS: 1.0000 mg | ORAL_TABLET | Freq: Four times a day (QID) | ORAL | Status: DC

## 2016-02-15 ENCOUNTER — Other Ambulatory Visit: Payer: Self-pay

## 2016-02-15 DIAGNOSIS — Z0283 Encounter for blood-alcohol and blood-drug test: Secondary | ICD-10-CM

## 2016-02-28 ENCOUNTER — Other Ambulatory Visit: Payer: Self-pay

## 2016-02-28 MED ORDER — ALPRAZOLAM 1 MG PO TABS: 1.0000 mg | ORAL_TABLET | Freq: Four times a day (QID) | ORAL | Status: DC

## 2016-02-29 ENCOUNTER — Ambulatory Visit: Payer: Self-pay | Admitting: Family Medicine

## 2016-03-07 ENCOUNTER — Other Ambulatory Visit: Payer: Self-pay

## 2016-03-07 DIAGNOSIS — F1110 Opioid abuse, uncomplicated: Secondary | ICD-10-CM

## 2016-03-07 DIAGNOSIS — Z0283 Encounter for blood-alcohol and blood-drug test: Secondary | ICD-10-CM

## 2016-03-08 ENCOUNTER — Other Ambulatory Visit: Payer: Self-pay | Admitting: Family Medicine

## 2016-03-09 LAB — DRUG SCREEN, URINE
Amphetamines, Urine: NEGATIVE ng/mL
BENZODIAZEPINE QUANT UR: POSITIVE ng/mL
Barbiturate screen, urine: NEGATIVE ng/mL
CANNABINOID QUANT UR: POSITIVE ng/mL
COCAINE (METAB.): NEGATIVE ng/mL
Opiate Quant, Ur: POSITIVE ng/mL
PCP Quant, Ur: NEGATIVE ng/mL

## 2016-03-09 LAB — PLEASE NOTE

## 2016-04-05 ENCOUNTER — Ambulatory Visit (INDEPENDENT_AMBULATORY_CARE_PROVIDER_SITE_OTHER): Payer: Medicaid Other | Admitting: Family Medicine

## 2016-04-05 ENCOUNTER — Encounter: Payer: Self-pay | Admitting: Family Medicine

## 2016-04-05 VITALS — BP 116/79 | HR 88 | Temp 99.0°F | Resp 18 | Ht 64.0 in | Wt 146.1 lb

## 2016-04-05 DIAGNOSIS — M545 Low back pain: Secondary | ICD-10-CM

## 2016-04-05 DIAGNOSIS — G8929 Other chronic pain: Secondary | ICD-10-CM | POA: Diagnosis not present

## 2016-04-05 DIAGNOSIS — M797 Fibromyalgia: Secondary | ICD-10-CM | POA: Diagnosis not present

## 2016-04-05 DIAGNOSIS — F419 Anxiety disorder, unspecified: Secondary | ICD-10-CM

## 2016-04-05 MED ORDER — ALPRAZOLAM 1 MG PO TABS: 1.0000 mg | ORAL_TABLET | Freq: Four times a day (QID) | ORAL | Status: DC

## 2016-04-05 MED ORDER — GABAPENTIN 300 MG PO CAPS: 300.0000 mg | ORAL_CAPSULE | Freq: Four times a day (QID) | ORAL | Status: DC

## 2016-04-05 MED ORDER — HYDROCODONE-ACETAMINOPHEN 7.5-325 MG PO TABS: 1.0000 | ORAL_TABLET | Freq: Three times a day (TID) | ORAL | Status: DC | PRN

## 2016-04-05 MED ORDER — TIZANIDINE HCL 4 MG PO TABS: 4.0000 mg | ORAL_TABLET | Freq: Three times a day (TID) | ORAL | Status: DC | PRN

## 2016-04-05 NOTE — Progress Notes (Signed)
Name: Patricia Rollins   MRN: [id number]    DOB: 1961/01/25   Date:04/05/2016       Progress Note  Subjective  Chief Complaint  Chief Complaint  Patient presents with  . Medication Refill    HPI  Fibromyalgia: Initial symptoms include widespread nerve pain (although worse in her legs and feet), fatigue, back pain, worse with damp and overcast weather. SHe also has low back and hip pain. Takes Hydrocodone-Acetaminophen 7.5-325 mg every 8 hours as needed, Gabapentin 300 mg 4 times daily, and Tizanidine 1 mg every 8 hours as needed. Pain is rated at 7/10 today,  Anxiety: Pt. Presents for follow up and medication refill on Alprazolam, taken 1 mg four times daily as needed. Without medication, her symptoms include rapid heart beat, sweating, confusion, globus, an overwhelming feeling of worry.   Past Medical History  Diagnosis Date  . Anxiety   . Depression   . Neuromuscular disorder ([place])   . Arthritis     patient has bilateral bursitis in hips  . Fibromyalgia   . Anxiety     Past Surgical History  Procedure Laterality Date  . Abdominal hysterectomy    . Tonsillectomy and adenoidectomy Bilateral   . Oophorectomy    . Appendectomy      History reviewed. No pertinent family history.  Social History   Social History  . Marital Status: Single    Spouse Name: N/A  . Number of Children: N/A  . Years of Education: N/A   Occupational History  . Not on file.   Social History Main Topics  . Smoking status: Current Every Day Smoker -- 1.00 packs/day    Types: Cigarettes  . Smokeless tobacco: Not on file  . Alcohol Use: Not on file  . Drug Use: No  . Sexual Activity: No   Other Topics Concern  . Not on file   Social History Narrative     Current outpatient prescriptions:  .  ALPRAZolam (XANAX) 1 MG tablet, Take 1 tablet (1 mg total) by mouth 4 (four) times daily., Disp: 124 tablet, Rfl: 1 .  gabapentin (NEURONTIN) 300 MG capsule, Take 1 capsule (300 mg total) by mouth 4  (four) times daily., Disp: 120 capsule, Rfl: 1 .  HYDROcodone-acetaminophen (NORCO) 7.5-325 MG per tablet, Take 1 tablet by mouth every 8 (eight) hours as needed for moderate pain., Disp: 90 tablet, Rfl: 0 .  HYDROcodone-acetaminophen (NORCO) 7.5-325 MG tablet, Take 1 tablet by mouth every 8 (eight) hours as needed., Disp: 93 tablet, Rfl: 0 .  HYDROcodone-acetaminophen (NORCO) 7.5-325 MG tablet, Take 1 tablet by mouth every 8 (eight) hours as needed., Disp: 93 tablet, Rfl: 0 .  HYDROcodone-acetaminophen (NORCO) 7.5-325 MG tablet, Take 1 tablet by mouth every 8 (eight) hours as needed., Disp: 93 tablet, Rfl: 0 .  HYDROcodone-acetaminophen (NORCO) 7.5-325 MG tablet, Take 1 tablet by mouth every 8 (eight) hours as needed for moderate pain., Disp: 93 tablet, Rfl: 0 .  HYDROcodone-acetaminophen (NORCO) 7.5-325 MG tablet, Take 1 tablet by mouth every 8 (eight) hours as needed for moderate pain., Disp: 93 tablet, Rfl: 0 .  HYDROcodone-acetaminophen (NORCO) 7.5-325 MG tablet, Take 1 tablet by mouth every 8 (eight) hours as needed for moderate pain., Disp: 93 tablet, Rfl: 0 .  tiZANidine (ZANAFLEX) 4 MG tablet, Take 1 tablet (4 mg total) by mouth every 8 (eight) hours as needed., Disp: 90 tablet, Rfl: 2 .  promethazine (PHENERGAN) 12.5 MG tablet, Take 1 tablet (12.5 mg total) by mouth every  6 (six) hours as needed for nausea or vomiting. (Patient not taking: Reported on 04/05/2016), Disp: 12 tablet, Rfl: 0  Allergies  Allergen Reactions  . Sulfa Antibiotics Nausea Only  . Prozac  [Fluoxetine]      Review of Systems  Constitutional: Positive for malaise/fatigue.  Musculoskeletal: Positive for myalgias, back pain and joint pain.  Psychiatric/Behavioral: Positive for depression. The patient is nervous/anxious and has insomnia.     Objective  Filed Vitals:   04/05/16 1440  BP: 116/79  Pulse: 88  Temp: 99 F (37.2 C)  TempSrc: Oral  Resp: 18  Height: 5\' 4"  (1.626 m)  Weight: 146 lb 1.6 oz (66.271  kg)  SpO2: 96%    Physical Exam  Constitutional: She is well-developed, well-nourished, and in no distress.  Cardiovascular: Normal rate and regular rhythm.   Pulmonary/Chest: Effort normal and breath sounds normal.  Musculoskeletal:       Lumbar back: She exhibits tenderness, pain and spasm.       Back:  Psychiatric: Mood, affect and judgment normal.  Nursing note and vitals reviewed.    Assessment & Plan  1. Fibromyalgia Chronic generalized musculoskeletal pain is responsive to hydrocodone and gabapentin. UDS was obtained last month by PCP, which did not show any drugs in her urine that she is supposed to be taking. We will obtain a new UDS today - gabapentin (NEURONTIN) 300 MG capsule; Take 1 capsule (300 mg total) by mouth 4 (four) times daily.  Dispense: 120 capsule; Refill: 0 - HYDROcodone-acetaminophen (NORCO) 7.5-325 MG tablet; Take 1 tablet by mouth every 8 (eight) hours as needed for moderate pain.  Dispense: 90 tablet; Refill: 0  2. Chronic LBP  - HYDROcodone-acetaminophen (NORCO) 7.5-325 MG tablet; Take 1 tablet by mouth every 8 (eight) hours as needed for moderate pain.  Dispense: 90 tablet; Refill: 0 - tiZANidine (ZANAFLEX) 4 MG tablet; Take 1 tablet (4 mg total) by mouth every 8 (eight) hours as needed.  Dispense: 90 tablet; Refill: 0  3. Anxiety Stable and responsive to alprazolam taken 4 times daily as needed. Refills provided and follow-up in one month - ALPRAZolam (XANAX) 1 MG tablet; Take 1 tablet (1 mg total) by mouth 4 (four) times daily.  Dispense: 120 tablet; Refill: 0   Meagen Limones [facility] 04/05/2016 2:54 PM

## 2016-04-30 ENCOUNTER — Telehealth: Payer: Self-pay | Admitting: Family Medicine

## 2016-04-30 NOTE — Telephone Encounter (Signed)
Pt called back and wants a call back.

## 2016-05-03 ENCOUNTER — Encounter: Payer: Self-pay | Admitting: Family Medicine

## 2016-05-03 ENCOUNTER — Ambulatory Visit: Payer: Self-pay | Admitting: Family Medicine

## 2016-05-03 VITALS — BP 118/70 | HR 83 | Temp 98.8°F | Resp 15 | Ht 64.0 in | Wt 145.6 lb

## 2016-05-03 DIAGNOSIS — M545 Low back pain, unspecified: Secondary | ICD-10-CM

## 2016-05-03 DIAGNOSIS — M797 Fibromyalgia: Secondary | ICD-10-CM

## 2016-05-03 DIAGNOSIS — G8929 Other chronic pain: Secondary | ICD-10-CM

## 2016-05-03 DIAGNOSIS — F419 Anxiety disorder, unspecified: Secondary | ICD-10-CM

## 2016-05-03 MED ORDER — GABAPENTIN 300 MG PO CAPS: 300.0000 mg | ORAL_CAPSULE | Freq: Four times a day (QID) | ORAL | Status: DC

## 2016-05-03 MED ORDER — ALPRAZOLAM 1 MG PO TABS
1.0000 mg | ORAL_TABLET | Freq: Four times a day (QID) | ORAL | Status: DC
Start: 2016-05-03 — End: 2016-06-21

## 2016-05-03 MED ORDER — HYDROCODONE-ACETAMINOPHEN 7.5-325 MG PO TABS: 1.0000 | ORAL_TABLET | Freq: Three times a day (TID) | ORAL | Status: DC | PRN

## 2016-05-03 MED ORDER — TIZANIDINE HCL 4 MG PO TABS: 4.0000 mg | ORAL_TABLET | Freq: Three times a day (TID) | ORAL | Status: DC | PRN

## 2016-05-03 NOTE — Progress Notes (Signed)
Name: Patricia Rollins   MRN: [id number]    DOB: [birth year]   Date:05/03/2016       Progress Note  Subjective  Chief Complaint  Chief Complaint  Patient presents with  . Follow-up    1 mo  . Medication Refill    HPI  Fibromyalgia: Pt. Presents for refill of Hydrocodone and follow up on Fibromyalgia. SHe has chronic widespread pain in the lower back, legs and hips (reportedly has bursitis in both hips). Her pain is unchanged from last time overall, but slightly worse recently due to the weather. She takes Hydrocodone-Acetaminophen 7.5-325 mg every 8 hours as needed, along with Gabapentin 300 mg 4 times daily and Tizanidine 1 mg every 8 hours as needed.   Anxiety:  Pt. Presents for follow up and medication refill on Alprazolam, taken 1 mg four times daily as needed for anxiety and Fibromyalgia.  Symptoms include worrying, nervousness, and panic attacks (rapid heart beat, sweating and shortness of breath) when she has not taken the medication.  Note: It is pertinent to mention that patient's urine drug screen obtained in May was positive for cannabis. This is a violation of controlled substances agreement. She is being referred to pain clinic and psychiatry to continue her care for fibromyalgia and anxiety disorder. We'll provide a two-week supply until patient can establish care with the appropriate specialists   Past Medical History  Diagnosis Date  . Anxiety   . Depression   . Neuromuscular disorder ([place])   . Arthritis     patient has bilateral bursitis in hips  . Fibromyalgia   . Anxiety     Past Surgical History  Procedure Laterality Date  . Abdominal hysterectomy    . Tonsillectomy and adenoidectomy Bilateral   . Oophorectomy    . Appendectomy      History reviewed. No pertinent family history.  Social History   Social History  . Marital Status: Single    Spouse Name: N/A  . Number of Children: N/A  . Years of Education: N/A   Occupational History  . Not on file.    Social History Main Topics  . Smoking status: Current Every Day Smoker -- 1.00 packs/day    Types: Cigarettes  . Smokeless tobacco: Not on file  . Alcohol Use: Not on file  . Drug Use: No  . Sexual Activity: No   Other Topics Concern  . Not on file   Social History Narrative     Current outpatient prescriptions:  .  ALPRAZolam (XANAX) 1 MG tablet, Take 1 tablet (1 mg total) by mouth 4 (four) times daily., Disp: 120 tablet, Rfl: 0 .  gabapentin (NEURONTIN) 300 MG capsule, Take 1 capsule (300 mg total) by mouth 4 (four) times daily., Disp: 120 capsule, Rfl: 0 .  HYDROcodone-acetaminophen (NORCO) 7.5-325 MG tablet, Take 1 tablet by mouth every 8 (eight) hours as needed for moderate pain., Disp: 90 tablet, Rfl: 0 .  tiZANidine (ZANAFLEX) 4 MG tablet, Take 1 tablet (4 mg total) by mouth every 8 (eight) hours as needed., Disp: 90 tablet, Rfl: 0 .  promethazine (PHENERGAN) 12.5 MG tablet, Take 1 tablet (12.5 mg total) by mouth every 6 (six) hours as needed for nausea or vomiting. (Patient not taking: Reported on 04/05/2016), Disp: 12 tablet, Rfl: 0  Allergies  Allergen Reactions  . Sulfa Antibiotics Nausea Only  . Prozac  [Fluoxetine]      Review of Systems  Musculoskeletal: Positive for back pain and joint pain.  Psychiatric/Behavioral: Negative  for depression. The patient is nervous/anxious and has insomnia.     Objective  Filed Vitals:   05/03/16 1432  BP: 118/70  Pulse: 83  Temp: 98.8 F (37.1 C)  TempSrc: Oral  Resp: 15  Height: 5\' 4"  (1.626 m)  Weight: 145 lb 9.6 oz (66.044 kg)  SpO2: 96%    Physical Exam  Constitutional: She is oriented to person, place, and time and well-developed, well-nourished, and in no distress.  Cardiovascular: Normal rate, regular rhythm, S1 normal, S2 normal and normal heart sounds.   No murmur heard. Pulmonary/Chest: Breath sounds normal. No respiratory distress. She has no wheezes. She has no rales.  Musculoskeletal:       Right  ankle: She exhibits no swelling.       Left ankle: She exhibits no swelling.       Lumbar back: She exhibits tenderness and pain. She exhibits no swelling and no deformity.  Lateral hip tenderness on palpation.  Neurological: She is alert and oriented to person, place, and time.  Psychiatric: Memory and judgment normal. Her mood appears anxious. She has a flat affect.  Nursing note and vitals reviewed.        Assessment & Plan  1. Fibromyalgia Stable on hydrocodone and gabapentin. Refills provided and referred to pain clinic - gabapentin (NEURONTIN) 300 MG capsule; Take 1 capsule (300 mg total) by mouth 4 (four) times daily.  Dispense: 120 capsule; Refill: 0 - HYDROcodone-acetaminophen (NORCO) 7.5-325 MG tablet; Take 1 tablet by mouth every 8 (eight) hours as needed for moderate pain.  Dispense: 45 tablet; Refill: 0 - Ambulatory referral to Pain Clinic  2. Anxiety Anxiety is stable, refills for 2 weeks provided and referred to psychiatry - ALPRAZolam (XANAX) 1 MG tablet; Take 1 tablet (1 mg total) by mouth 4 (four) times daily.  Dispense: 60 tablet; Refill: 0 - Ambulatory referral to Psychiatry  3. Chronic LBP  - HYDROcodone-acetaminophen (NORCO) 7.5-325 MG tablet; Take 1 tablet by mouth every 8 (eight) hours as needed for moderate pain.  Dispense: 45 tablet; Refill: 0 - tiZANidine (ZANAFLEX) 4 MG tablet; Take 1 tablet (4 mg total) by mouth every 8 (eight) hours as needed.  Dispense: 90 tablet; Refill: 0  Cimberly Stoffel [facility] 05/03/2016 2:46 PM

## 2016-05-08 NOTE — Telephone Encounter (Signed)
Patient came in for appointment.  

## 2016-05-17 ENCOUNTER — Telehealth: Payer: Self-pay | Admitting: Family Medicine

## 2016-05-17 DIAGNOSIS — G8929 Other chronic pain: Secondary | ICD-10-CM

## 2016-05-17 DIAGNOSIS — M797 Fibromyalgia: Secondary | ICD-10-CM

## 2016-05-17 DIAGNOSIS — M545 Low back pain: Secondary | ICD-10-CM

## 2016-05-17 NOTE — Telephone Encounter (Signed)
Patient stated that the pain clinic told her that they would call her back to set up an appointment for sometime in August.  Patient is requesting a refill on her HydroCodone medication.  Please call once complete.

## 2016-05-18 MED ORDER — HYDROCODONE-ACETAMINOPHEN 7.5-325 MG PO TABS: 1.0000 | ORAL_TABLET | Freq: Three times a day (TID) | ORAL | Status: DC | PRN

## 2016-05-18 NOTE — Telephone Encounter (Signed)
Prescription for Hydrocodone written for 30 days is ready for pickup

## 2016-06-07 ENCOUNTER — Telehealth: Payer: Self-pay | Admitting: Family Medicine

## 2016-06-07 NOTE — Telephone Encounter (Signed)
I had provided prescription for Xanax for 2 weeks and a referral to psychiatry to manage her anxiety. It appears that patient has not scheduled any appointment with psychiatrist. Unfortunately, I will not be able to  provide any more refills of Xanax.

## 2016-06-07 NOTE — Telephone Encounter (Signed)
Patient is requesting a refill on xanax.  Patient also stated that she is still waiting on an appointment from pain management and that she has enough pain medication to last until next week, however I did inform patient that no more pain medication will be prescribed at all from any of the providers here.  I also informed patient that we can give her list of psychiatrists in the area.  Please contact patient once complete.

## 2016-06-18 ENCOUNTER — Telehealth: Payer: Self-pay | Admitting: Family Medicine

## 2016-06-18 NOTE — Telephone Encounter (Signed)
Patient was provided a two-week supply at the time of her failed urine drug screen. This was done to allow her enough time to schedule an appointment with psychiatrist. At this time, will not allow any more refills for alprazolam.

## 2016-06-19 NOTE — Telephone Encounter (Signed)
Pt. Has already been referred to pain management.

## 2016-06-19 NOTE — Telephone Encounter (Signed)
Pt informed

## 2016-06-19 NOTE — Telephone Encounter (Signed)
Pt is asking to be referred to pain management. Pt states she still has not received a call for an appt.

## 2016-06-21 ENCOUNTER — Encounter: Payer: Self-pay | Admitting: Family Medicine

## 2016-06-21 ENCOUNTER — Ambulatory Visit (INDEPENDENT_AMBULATORY_CARE_PROVIDER_SITE_OTHER): Payer: Medicaid Other | Admitting: Family Medicine

## 2016-06-21 DIAGNOSIS — M797 Fibromyalgia: Secondary | ICD-10-CM

## 2016-06-21 DIAGNOSIS — R319 Hematuria, unspecified: Secondary | ICD-10-CM | POA: Diagnosis not present

## 2016-06-21 DIAGNOSIS — K148 Other diseases of tongue: Secondary | ICD-10-CM

## 2016-06-21 DIAGNOSIS — F419 Anxiety disorder, unspecified: Secondary | ICD-10-CM

## 2016-06-21 HISTORY — DX: Hematuria, unspecified: R31.9

## 2016-06-21 LAB — POCT URINALYSIS DIPSTICK
BILIRUBIN UA: NEGATIVE
GLUCOSE UA: NEGATIVE
KETONES UA: NEGATIVE
Leukocytes, UA: NEGATIVE
Nitrite, UA: NEGATIVE
PH UA: 5
Protein, UA: NEGATIVE
SPEC GRAV UA: 1.02
Urobilinogen, UA: 0.2

## 2016-06-21 MED ORDER — ALPRAZOLAM 1 MG PO TABS: 1.0000 mg | ORAL_TABLET | Freq: Four times a day (QID) | ORAL | 0 refills | Status: DC

## 2016-06-21 MED ORDER — HYDROCODONE-ACETAMINOPHEN 7.5-325 MG PO TABS
1.0000 | ORAL_TABLET | Freq: Three times a day (TID) | ORAL | 0 refills | Status: DC | PRN
Start: 2016-06-21 — End: 2016-07-26

## 2016-06-21 NOTE — Progress Notes (Signed)
Name: Patricia Rollins   MRN: [id number]    DOB: [birth year]   Date:06/21/2016       Progress Note  Subjective  Chief Complaint  Chief Complaint  Patient presents with  . Medication Reaction    Gabapentin   . Medication Refill    HPI  Blister on the Tongue:  Present for over 6 months, white in color, and feels like it comes on/or gets worse every time she takes Gabapentin (she also experiences a strange taste in her mouth). She is wondering if the blister is a side effect of gabapentin   Anxiety: Patient presents for follow up on anxiety. She is now referred to Psychiatrist and has an appointment on September 1st, 2017. She takes Alprazolam 1mg  4 times daily as needed for anxiety and also helps with Fibromyalgia.   Fibromyalgia: Pt. Presents for follow up and medication refills on Hydrocodone-Acetaminophen 7.5-325 mg every 8 hours as needed. She has chronic pain spread throughout her body, pain is worse in lower back, both hips, and both legs. She has been referred to Pain Management and has an appointment on September 13th, 2017. She will need enough medication until her appointment with Pain Clinic.   Hematuria: Has noticed blood in the urine for 2 weeks, no frank burning but feels a different sensation when she urinates. Has noted she has not felt good recently but no fevers or chills (temperature slightly elevated at 99.59f today).     Past Medical History:  Diagnosis Date  . Anxiety   . Anxiety   . Arthritis    patient has bilateral bursitis in hips  . Depression   . Fibromyalgia   . Neuromuscular disorder [facility])     Past Surgical History:  Procedure Laterality Date  . ABDOMINAL HYSTERECTOMY    . APPENDECTOMY    . OOPHORECTOMY    . TONSILLECTOMY AND ADENOIDECTOMY Bilateral     History reviewed. No pertinent family history.  Social History   Social History  . Marital status: Single    Spouse name: N/A  . Number of children: N/A  . Years of education: N/A    Occupational History  . Not on file.   Social History Main Topics  . Smoking status: Current Every Day Smoker    Packs/day: 1.00    Types: Cigarettes  . Smokeless tobacco: Never Used  . Alcohol use Not on file  . Drug use: No  . Sexual activity: No   Other Topics Concern  . Not on file   Social History Narrative  . No narrative on file     Current Outpatient Prescriptions:  .  ALPRAZolam (XANAX) 1 MG tablet, Take 1 tablet (1 mg total) by mouth 4 (four) times daily., Disp: 60 tablet, Rfl: 0 .  gabapentin (NEURONTIN) 300 MG capsule, Take 1 capsule (300 mg total) by mouth 4 (four) times daily., Disp: 120 capsule, Rfl: 0 .  HYDROcodone-acetaminophen (NORCO) 7.5-325 MG tablet, Take 1 tablet by mouth every 8 (eight) hours as needed for moderate pain., Disp: 90 tablet, Rfl: 0 .  promethazine (PHENERGAN) 12.5 MG tablet, Take 1 tablet (12.5 mg total) by mouth every 6 (six) hours as needed for nausea or vomiting., Disp: 12 tablet, Rfl: 0 .  tiZANidine (ZANAFLEX) 4 MG tablet, Take 1 tablet (4 mg total) by mouth every 8 (eight) hours as needed., Disp: 90 tablet, Rfl: 0  Allergies  Allergen Reactions  . Sulfa Antibiotics Nausea Only  . Prozac  [Fluoxetine]  Review of Systems  Constitutional: Negative for chills and fever.  Genitourinary: Positive for dysuria and hematuria.  Musculoskeletal: Positive for back pain and joint pain.  Psychiatric/Behavioral: The patient is nervous/anxious.      Objective  Vitals:   06/21/16 1208  BP: 122/73  Pulse: (!) 102  Resp: 16  Temp: 99.5 F (37.5 C)  TempSrc: Oral  SpO2: 96%  Weight: 145 lb 3.2 oz (65.9 kg)  Height: 5\' 4"  (1.626 m)    Physical Exam  Constitutional: She is oriented to person, place, and time and well-developed, well-nourished, and in no distress.  HENT:  Head: Normocephalic and atraumatic.  Mouth/Throat:    Fissured area over the right lateral side of tongue.  Cardiovascular: Normal rate, regular rhythm  and normal heart sounds.   No murmur heard. Pulmonary/Chest: Effort normal and breath sounds normal. She has no wheezes. She has no rhonchi.  Abdominal: Bowel sounds are normal. There is tenderness in the suprapubic area and left upper quadrant. There is no CVA tenderness.  Neurological: She is alert and oriented to person, place, and time.  Psychiatric: Memory, affect and judgment normal. Her mood appears anxious. She does not exhibit a depressed mood.  Nursing note and vitals reviewed.    Assessment & Plan  1. Anxiety We'll provide refill to cover the time to her appointment with psychiatry. Patient understands that we'll not provide any more refills after she has established with psychiatrist. - ALPRAZolam Duanne Moron) 1 MG tablet; Take 1 tablet (1 mg total) by mouth 4 (four) times daily.  Dispense: 84 tablet; Refill: 0  2. Fibromyalgia Similarly, we'll provide enough medication to cover the time to her appointment with pain clinic. We will not provide any opioids after she has established with the pain clinic. - HYDROcodone-acetaminophen (NORCO) 7.5-325 MG tablet; Take 1 tablet by mouth every 8 (eight) hours as needed for moderate pain.  Dispense: 90 tablet; Refill: 0  3. Hematuria Urinalysis shows moderate blood, leukocytes and nitrites are negative. We will obtain a urine microscopic exam and culture for further assessment - POCT Urinalysis Dipstick  4. Tongue lesion Likely from trauma to the tongue (as in a tongue bite), we will refer to ENT for further assessment.  - Ambulatory referral to ENT   Celene Pippins [facility] 06/21/2016 12:18 PM

## 2016-06-22 ENCOUNTER — Other Ambulatory Visit: Payer: Self-pay | Admitting: Family Medicine

## 2016-06-22 ENCOUNTER — Telehealth: Payer: Self-pay | Admitting: Family Medicine

## 2016-06-22 DIAGNOSIS — R319 Hematuria, unspecified: Secondary | ICD-10-CM

## 2016-06-22 NOTE — Progress Notes (Signed)
Patient to return on Monday, 06/25/2016 to provide a fresh urine specimen to be sent for microscopic and urine culture

## 2016-06-25 ENCOUNTER — Other Ambulatory Visit: Payer: Self-pay | Admitting: Family Medicine

## 2016-06-25 ENCOUNTER — Other Ambulatory Visit: Payer: Self-pay

## 2016-06-25 DIAGNOSIS — R319 Hematuria, unspecified: Secondary | ICD-10-CM

## 2016-06-25 NOTE — Telephone Encounter (Signed)
Spoke with patient and she will return today 06/25/2016 to drop off urine sample

## 2016-06-26 LAB — URINALYSIS, MICROSCOPIC ONLY
BACTERIA UA: NONE SEEN [HPF]
CASTS: NONE SEEN [LPF]
Crystals: NONE SEEN [HPF]
WBC, UA: NONE SEEN WBC/HPF (ref <=5)
YEAST: NONE SEEN [HPF]

## 2016-06-26 LAB — URINALYSIS, ROUTINE W REFLEX MICROSCOPIC
BILIRUBIN URINE: NEGATIVE
GLUCOSE, UA: NEGATIVE
Ketones, ur: NEGATIVE
Leukocytes, UA: NEGATIVE
Nitrite: NEGATIVE
PH: 5.5 (ref 5.0–8.0)
PROTEIN: NEGATIVE
Specific Gravity, Urine: 1.018 (ref 1.001–1.035)

## 2016-06-26 LAB — URINE CULTURE: Organism ID, Bacteria: 10000

## 2016-06-28 ENCOUNTER — Telehealth: Payer: Self-pay | Admitting: Family Medicine

## 2016-06-28 DIAGNOSIS — R319 Hematuria, unspecified: Secondary | ICD-10-CM

## 2016-06-28 NOTE — Telephone Encounter (Signed)
Please inform patient that her urine shows blood but no evidence of infection. We will refer to urology for evaluation of hematuria and antibiotics are not indicated in this case.

## 2016-06-29 NOTE — Telephone Encounter (Signed)
LEFT MESSAGE AND GAVE DR MESSAGE

## 2016-07-02 ENCOUNTER — Telehealth: Payer: Self-pay | Admitting: Family Medicine

## 2016-07-02 NOTE — Telephone Encounter (Signed)
Patient called wanting a referral for pain management to be sent to [facility].  Notes and referral form was faxed on 07/02/16 @ 4:49pm

## 2016-07-03 ENCOUNTER — Other Ambulatory Visit: Payer: Self-pay | Admitting: Family Medicine

## 2016-07-03 DIAGNOSIS — R319 Hematuria, unspecified: Secondary | ICD-10-CM

## 2016-07-03 NOTE — Progress Notes (Signed)
Referral to urology has been entered

## 2016-07-18 ENCOUNTER — Ambulatory Visit (INDEPENDENT_AMBULATORY_CARE_PROVIDER_SITE_OTHER): Payer: Medicaid Other | Admitting: Family Medicine

## 2016-07-18 ENCOUNTER — Encounter: Payer: Self-pay | Admitting: Family Medicine

## 2016-07-18 DIAGNOSIS — Z1211 Encounter for screening for malignant neoplasm of colon: Secondary | ICD-10-CM | POA: Insufficient documentation

## 2016-07-18 DIAGNOSIS — Z0000 Encounter for general adult medical examination without abnormal findings: Secondary | ICD-10-CM

## 2016-07-18 DIAGNOSIS — R222 Localized swelling, mass and lump, trunk: Secondary | ICD-10-CM

## 2016-07-18 LAB — CBC WITH DIFFERENTIAL/PLATELET
BASOS PCT: 1 %
Basophils Absolute: 73 cells/uL (ref 0–200)
EOS ABS: 146 {cells}/uL (ref 15–500)
Eosinophils Relative: 2 %
HEMATOCRIT: 41.2 % (ref 35.0–45.0)
HEMOGLOBIN: 13.5 g/dL (ref 11.7–15.5)
LYMPHS ABS: 2847 {cells}/uL (ref 850–3900)
LYMPHS PCT: 39 %
MCH: 31.6 pg (ref 27.0–33.0)
MCHC: 32.8 g/dL (ref 32.0–36.0)
MCV: 96.5 fL (ref 80.0–100.0)
MONO ABS: 730 {cells}/uL (ref 200–950)
MPV: 10.6 fL (ref 7.5–12.5)
Monocytes Relative: 10 %
NEUTROS PCT: 48 %
Neutro Abs: 3504 cells/uL (ref 1500–7800)
Platelets: 319 10*3/uL (ref 140–400)
RBC: 4.27 MIL/uL (ref 3.80–5.10)
RDW: 13.1 % (ref 11.0–15.0)
WBC: 7.3 10*3/uL (ref 3.8–10.8)

## 2016-07-18 NOTE — Progress Notes (Signed)
Name: KADYN PRIZZI   MRN: [id number]    DOB: [birth year]   Date:07/18/2016       Progress Note  Subjective  Chief Complaint  Chief Complaint  Patient presents with  . Annual Exam    HPI  Pt. Presents for annual physical exam.  Past Medical History:  Diagnosis Date  . Anxiety   . Anxiety   . Arthritis    patient has bilateral bursitis in hips  . Depression   . Fibromyalgia   . Neuromuscular disorder [facility])     Past Surgical History:  Procedure Laterality Date  . ABDOMINAL HYSTERECTOMY    . APPENDECTOMY    . OOPHORECTOMY    . TONSILLECTOMY AND ADENOIDECTOMY Bilateral     No family history on file.  Social History   Social History  . Marital status: Single    Spouse name: N/A  . Number of children: N/A  . Years of education: N/A   Occupational History  . Not on file.   Social History Main Topics  . Smoking status: Current Every Day Smoker    Packs/day: 1.00    Types: Cigarettes  . Smokeless tobacco: Never Used  . Alcohol use Not on file  . Drug use: No  . Sexual activity: No   Other Topics Concern  . Not on file   Social History Narrative  . No narrative on file     Current Outpatient Prescriptions:  .  ALPRAZolam (XANAX) 1 MG tablet, Take 1 tablet (1 mg total) by mouth 4 (four) times daily., Disp: 84 tablet, Rfl: 0 .  gabapentin (NEURONTIN) 300 MG capsule, Take 1 capsule (300 mg total) by mouth 4 (four) times daily., Disp: 120 capsule, Rfl: 0 .  HYDROcodone-acetaminophen (NORCO) 7.5-325 MG tablet, Take 1 tablet by mouth every 8 (eight) hours as needed for moderate pain., Disp: 90 tablet, Rfl: 0 .  promethazine (PHENERGAN) 12.5 MG tablet, Take 1 tablet (12.5 mg total) by mouth every 6 (six) hours as needed for nausea or vomiting., Disp: 12 tablet, Rfl: 0 .  tiZANidine (ZANAFLEX) 4 MG tablet, Take 1 tablet (4 mg total) by mouth every 8 (eight) hours as needed., Disp: 90 tablet, Rfl: 0  Allergies  Allergen Reactions  . Sulfa Antibiotics Nausea Only  .  Prozac  [Fluoxetine]      Review of Systems  Constitutional: Positive for malaise/fatigue (chronic fatigue). Negative for chills and fever.  HENT: Negative for congestion and sore throat.   Eyes: Negative for blurred vision and double vision.  Respiratory: Negative for cough and shortness of breath.   Cardiovascular: Negative for chest pain, palpitations and leg swelling.  Gastrointestinal: Positive for abdominal pain, blood in stool and nausea (nausea some days). Negative for constipation, diarrhea, heartburn and vomiting.  Genitourinary: Positive for dysuria and hematuria.  Musculoskeletal: Positive for back pain and myalgias.  Skin: Negative for itching and rash.  Neurological: Negative for dizziness and headaches.  Psychiatric/Behavioral: Positive for depression. The patient is nervous/anxious and has insomnia.     Objective  Vitals:   07/18/16 1124  BP: 116/68  Pulse: 93  Resp: 16  Temp: 98.5 F (36.9 C)  SpO2: 96%  Weight: 144 lb 2 oz (65.4 kg)  Height: 5\' 4"  (1.626 m)    Physical Exam  Constitutional: She is oriented to person, place, and time and well-developed, well-nourished, and in no distress.  HENT:  Head: Normocephalic and atraumatic.  Cardiovascular: Normal rate, regular rhythm, S1 normal and S2 normal.  No murmur heard. Pulmonary/Chest: Effort normal and breath sounds normal. She has no wheezes.    Left chest wall mass, raised non tender, well defined, likely consistent with lipoma.  Abdominal: Soft. Bowel sounds are normal. There is no tenderness.  Genitourinary: Rectal exam shows mass (small skin tag at the perianal area.). Rectal exam shows no laceration, no tenderness and guaiac negative stool.  Genitourinary Comments: Gynecological exam deferred  Musculoskeletal:       Right ankle: She exhibits no swelling.       Left ankle: She exhibits no swelling.  Neurological: She is alert and oriented to person, place, and time.  Skin: Skin is warm and dry.   Psychiatric: Mood, memory, affect and judgment normal.  Nursing note and vitals reviewed.    Assessment & Plan  1. Annual physical exam Obtain age-appropriate laboratory screening workup, referral to gynecology for breast exam, pelvic exam and appropriate screening studies - Lipid Profile - CBC with Differential - COMPLETE METABOLIC PANEL WITH GFR - TSH - Vitamin D (25 hydroxy) - Ambulatory referral to Gynecology  2. Mass of chest wall, left Likely a lipoma, we'll obtain CT scan of chest for further assessment - CT Chest Wo Contrast; Future  3. Colon cancer screening Referral to gastroenterology for colonoscopy - Ambulatory referral to Gastroenterology   Malie Kashani [facility] A[facility] 07/18/2016 11:47 AM

## 2016-07-19 LAB — VITAMIN D 25 HYDROXY (VIT D DEFICIENCY, FRACTURES): VIT D 25 HYDROXY: 14 ng/mL — AB (ref 30–100)

## 2016-07-19 LAB — LIPID PANEL
CHOL/HDL RATIO: 3.8 ratio (ref <=5.0)
CHOLESTEROL: 205 mg/dL — AB (ref 125–200)
HDL: 54 mg/dL (ref >=46)
LDL Cholesterol: 122 mg/dL (ref <130)
Triglycerides: 143 mg/dL (ref <150)
VLDL: 29 mg/dL (ref <30)

## 2016-07-19 LAB — COMPLETE METABOLIC PANEL WITH GFR
ALBUMIN: 3.7 g/dL (ref 3.6–5.1)
ALK PHOS: 55 U/L (ref 33–130)
ALT: 6 U/L (ref 6–29)
AST: 11 U/L (ref 10–35)
BILIRUBIN TOTAL: 0.2 mg/dL (ref 0.2–1.2)
BUN: 15 mg/dL (ref 7–25)
CALCIUM: 9 mg/dL (ref 8.6–10.4)
CO2: 27 mmol/L (ref 20–31)
CREATININE: 0.62 mg/dL (ref 0.50–1.05)
Chloride: 105 mmol/L (ref 98–110)
GFR, Est Non African American: >89 mL/min (ref >=60)
Glucose, Bld: 82 mg/dL (ref 65–99)
Potassium: 4.1 mmol/L (ref 3.5–5.3)
Sodium: 143 mmol/L (ref 135–146)
Total Protein: 6.8 g/dL (ref 6.1–8.1)

## 2016-07-19 LAB — TSH: TSH: 0.7 m[IU]/L

## 2016-07-20 ENCOUNTER — Telehealth: Payer: Self-pay | Admitting: Gastroenterology

## 2016-07-20 NOTE — Telephone Encounter (Signed)
colonoscopy

## 2016-07-23 ENCOUNTER — Ambulatory Visit: Payer: Medicaid Other | Admitting: Urology

## 2016-07-23 NOTE — Telephone Encounter (Signed)
Patient returned your call regarding an colonoscopy

## 2016-07-23 NOTE — Telephone Encounter (Signed)
Called patient and LVM.

## 2016-07-23 NOTE — Telephone Encounter (Signed)
Patient is returning your call regarding a colonoscopy °

## 2016-07-24 ENCOUNTER — Encounter: Payer: Self-pay | Admitting: Pain Medicine

## 2016-07-24 ENCOUNTER — Ambulatory Visit: Payer: Medicaid Other | Admitting: Family Medicine

## 2016-07-24 DIAGNOSIS — Z0189 Encounter for other specified special examinations: Secondary | ICD-10-CM | POA: Insufficient documentation

## 2016-07-24 DIAGNOSIS — F1190 Opioid use, unspecified, uncomplicated: Secondary | ICD-10-CM | POA: Insufficient documentation

## 2016-07-24 DIAGNOSIS — Z79891 Long term (current) use of opiate analgesic: Secondary | ICD-10-CM | POA: Insufficient documentation

## 2016-07-24 DIAGNOSIS — Z5181 Encounter for therapeutic drug level monitoring: Secondary | ICD-10-CM | POA: Insufficient documentation

## 2016-07-24 DIAGNOSIS — G8929 Other chronic pain: Secondary | ICD-10-CM | POA: Insufficient documentation

## 2016-07-24 DIAGNOSIS — M25559 Pain in unspecified hip: Secondary | ICD-10-CM

## 2016-07-24 DIAGNOSIS — Z79899 Other long term (current) drug therapy: Secondary | ICD-10-CM | POA: Insufficient documentation

## 2016-07-24 NOTE — Progress Notes (Signed)
Patient's Name: Patricia Rollins  MRN: [id number]  Referring Provider: Roselee Nova, MD  DOB: 1961/05/09  PCP: Ashok Norris, MD  DOS: 07/25/2016  Note by: Kathlen Brunswick. Dossie Arbour, MD  Service setting: Ambulatory outpatient  Specialty: Interventional Pain Management  Location: [facility] (AMB) Pain Management Facility    Patient type: New patient   Primary Reason(s) for Visit: Initial Patient Evaluation CC: Hip Pain (bilateral) and Pain (fibromyalgia pain)  HPI  [name] is a 55 y.o. year old, female patient, who comes today for an initial evaluation. She has Chronic lumbar pain; Fibromyalgia; Generalized anxiety disorder; Chronic low back pain (Location of Secondary source of pain) (Bilateral) (R>L); CD (contact dermatitis); Lumbosacral neuritis; Menopausal and perimenopausal disorder; Chronic pain; Menopausal symptom; Chronic pain syndrome; Trochanteric bursitis (Bilateral); Hot flash, menopausal; Compulsive tobacco user syndrome; Abnormal mammogram; Tongue lesion; Annual physical exam; Mass of chest wall, left; Colon cancer screening; Long term current use of opiate analgesic; Long term prescription opiate use; Opiate use; Encounter for therapeutic drug level monitoring; Encounter for pain management planning; Long term prescription benzodiazepine use; Chronic hip pain (Location of Primary Source of Pain) (Bilateral) (R>L); Smoker; Cigarette nicotine dependence; Lumbar facet syndrome (Bilateral) (R>L); Chronic sacroiliac joint pain (Bilateral) (R>L); Chronic shoulder pain (Left); Peripheral neuropathy (HCC) (lower extremity) (Bilateral) (L>R); Neurogenic pain; Neuropathic pain; Musculoskeletal pain; Chronic neck pain (Bilateral) (L>R); Chest pain of uncertain etiology; Chronic lower extremity pain (Bilateral) (L>R); and Chronic upper extremity pain (Bilateral) (L>R) on her problem list.. Her primarily concern today is the Hip Pain (bilateral) and Pain (fibromyalgia pain)  Pain Assessment: Self-Reported  Pain Score: 6  Clinically the patient looks like a 2/10 Reported level is inconsistent with clinical observations. Information on the proper use of the pain score provided to the patient today. Pain Type: Chronic pain Pain Location: Hip Pain Orientation: Right, Left Pain Descriptors / Indicators: Dull, Aching (stinging) Pain Frequency: Intermittent  Onset and Duration: Gradual, Date of onset: 6-7 years ago and Present longer than 3 months Cause of pain: Unknown Severity: No change since onset, NAS-11 at its worse: 9/10, NAS-11 at its best: 6/10, NAS-11 now: 8/10 and NAS-11 on the average: 8/10 Timing: Not influenced by the time of the day, During activity or exercise, After activity or exercise and After a period of immobility Aggravating Factors: Bending, Lifiting, Motion, Prolonged sitting, Prolonged standing, Walking, Walking uphill and Walking downhill Alleviating Factors: Stretching, Hot packs, Lying down, Medications and Resting Associated Problems: Fatigue, Inability to concentrate, Temperature changes, Tingling and Weakness Quality of Pain: Agonizing, Deep, Exhausting, Heavy, Shooting, Tingling, Tiring and Uncomfortable Previous Examinations or Tests: The patient denies The patient denies any previous examinations or tests including biopsies, bone scans, CPT testing, CT scans, CT myelograms, discograms, EMG/PNCV, endoscopies, epidurograms, MRI scans, myelograms, nerve blocks, spinal taps, x-rays, nerve conduction testing, neurological evaluations, neurosurgical evaluation, orthopedic evaluations, chiropractic evaluation, and psychiatric evaluations. Previous Treatments: The patient denies Any biofeedback, chiropractic manipulations, parietal analgesia, epidural steroid injections, facet blocks, hypnotherapy, morphine pump, narcotic medications, physical therapy, pool exercises, radiofrequency, relaxation therapy, spinal cord stimulation, steroid treatments by mouth, stretching exercises, the  use of a TENS unit, traction, or trigger point injections.  The patient comes into the clinics today for the first time for a chronic pain management evaluation. According to the patient her primary source of pain is that of her hips with the right side being worst on the left. She denies any surgeries or prior injections. Her second source of pain is that of  the lower back with the right side being worst on the left. Again, she denies any surgeries or prior injections. Her third worst pain is that of her left shoulder where she suffered a collar bone fracture. She denies any surgeries or injections but she did use a sling for 6-8 weeks. Her next area of pain is that of the lower extremities with the left being worst on the right. In the case of the left lower extremity the pain goes all the way down to the inner portion of her foot in what seems to be the L4 distribution. In the case of her right lower extremity she indicates having tingling and burning sensation in her foot going to all of her toes. He indicates that this is also true of her left foot. Next she indicates having pain in both upper extremities with the left being worse than the right. In the case of the left upper extremity the pain is described to go down to the wrist but not into the hand. The same is true for her right upper extremity. Her next area of pain is that of the neck with the left side being worst on the right. She denies any surgery or injections. She indicates being right-handed.  Prior medications have included benzodiazepines, which she describes help for the pressure. She has also used hydrocodone. She describes that it works for her bursitis. In addition she is using gabapentin, but describes having an allergy to it. When I asked the patient about the allergies she described having sore muscles, or vision, sleeping sleepy, swelling of her tongue, and tongue blisters. I asked the patient when she took her last gabapentin and  she indicated that she took her last night. I then went ahead and inspected her tongue but she had no swelling or blisters. She then changed her story to indicate that that usually happens for about 12 hours after taking the medication. In addition to these medications she also takes tizanidine as a muscle relaxant.  Today I took the time to provide the patient with information regarding my pain practice. The patient was informed that my practice is divided into two sections: an interventional pain management section, as well as a completely separate and distinct medication management section. The interventional portion of my practice takes place on Tuesdays and Thursdays, while the medication management is conducted on Mondays and Wednesdays. Because of the amount of documentation required on both them, they are kept separated. This means that there is the possibility that the patient may be scheduled for a procedure on Tuesday, while also having a medication management appointment on Wednesday. I have also informed the patient that because of current staffing and facility limitations, I no longer take patients for medication management only. To illustrate the reasons for this, I gave the patient the example of a surgeon and how inappropriate it would be to refer a patient to his/her practice so that they write for the post-procedure antibiotics on a surgery done by someone else.   The patient was informed that joining my practice means that they are open to any and all interventional therapies. I clarified for the patient that this does not mean that they will be forced to have any procedures done. What it means is that patients looking for a practitioner to simply write for their pain medications and not take advantage of other interventional techniques will be better served by a different practitioner, other than myself. I made it clear that I  prefer to spend my time providing those services that I  specialize in.  The patient was also made aware of my Comprehensive Pain Management Safety Guidelines where by joining my practice, they limit all of their nerve blocks and joint injections to those done by our practice, for as long as we are retained to manage their controlled substances.   Historic Controlled Substance Pharmacotherapy Review  Previously Prescribed Opioids: Oxycodone/APAP 5/325; hydrocodone/APAP 7.5/325 (08/08/2010). In addition to this the patient has been using alprazolam 1 tablet by mouth 4 times a day. Currently Prescribed Analgesic: Hydrocodone/APAP 7.5/325 one tablet by mouth every 8 hours (22.5 mg/day of hydrocodone) Medications: The patient did not bring the medication(s) to the appointment, as requested in our "New Patient Package" MME/day: 22.5 mg/day Pharmacodynamics: Analgesic Effect: More than 50% Activity Facilitation: Medication(s) allow patient to sit, stand, walk, and do the basic ADLs Perceived Effectiveness: Described as relatively effective, allowing for increase in activities of daily living (ADL) Side-effects or Adverse reactions: None reported Historical Background Evaluation: [place] PDMP: Five (5) year initial data search conducted. Review of the PMP reveals that she has been using alprazolam and opioids on a monthly basis since 08/08/2010 [place] [employer] Offender Public Information: Non-contributory UDS Results: No UDS results available at this time UDS Interpretation: N/A Medication Assessment Form: Not applicable. Initial evaluation. The patient has not received any medications from our practice Treatment compliance: Not applicable. Initial evaluation Risk Assessment: Aberrant Behavior: inability to consider abstinence Opioid Fatal Overdose Risk Factors: None identified today Non-fatal overdose hazard ratio (HR): Calculation deferred Fatal overdose hazard ratio (HR): Calculation deferred Substance Use Disorder (SUD) Risk Level: Pending  results of Medical Psychology Evaluation for SUD Opioid Risk Tool (ORT) Score: Total Score: 1 Low Risk for SUD (Score <3) Depression Scale Score: PHQ-2: PHQ-2 Total Score: 0 No depression (0) PHQ-9: PHQ-9 Total Score: 0 No depression (0-4)  Pharmacologic Plan: Pending ordered tests and/or consults  Historical Illicit Drug Screen Labs(s): Lab Results  Component Value Date   COCAINSCRNUR Negative 03/08/2016    Meds  The patient has a current medication list which includes the following prescription(s): alprazolam, gabapentin, tizanidine, aspirin, hydrocodone-acetaminophen, and vitamin d (ergocalciferol).  Current Outpatient Prescriptions on File Prior to Visit  Medication Sig  . ALPRAZolam (XANAX) 1 MG tablet Take 1 tablet (1 mg total) by mouth 4 (four) times daily.  Marland Kitchen gabapentin (NEURONTIN) 300 MG capsule Take 1 capsule (300 mg total) by mouth 4 (four) times daily.  Marland Kitchen tiZANidine (ZANAFLEX) 4 MG tablet Take 1 tablet (4 mg total) by mouth every 8 (eight) hours as needed.   No current facility-administered medications on file prior to visit.     Imaging Review  Shoulder Imaging: Shoulder-L DG:  Results for orders placed during the hospital encounter of 07/17/15  DG Shoulder Left   Narrative CLINICAL DATA:  Pain and bruising after fall while moving a chair 3 days ago.  EXAM: LEFT SHOULDER - 2+ VIEW  COMPARISON:  None.  FINDINGS: Comminuted nondisplaced distal clavicle fracture. Humeral head is well formed and located. Acromioclavicular, subacromial and glenohumeral joint spaces intact. No destructive bony lesions. Calcifications projecting LEFT lung apex are related to the anterior ribs.  IMPRESSION: Acute comminuted nondisplaced distal clavicle fracture. No dislocation.   Electronically Signed   By: Elon Alas M.D.   On: 07/17/2015 02:40    Lumbosacral Imaging: Lumbar MR wo contrast:  Results for orders placed in visit on 02/05/05  MR Lyons W/O Cm  Narrative * PRIOR REPORT IMPORTED FROM AN EXTERNAL SYSTEM *   PRIOR REPORT IMPORTED FROM THE [facility] WORKFLOW SYSTEM   REASON FOR EXAM:  Low back pain with radiculopathy  COMMENTS:   PROCEDURE:     MR  - MR LUMBAR SPINE WO CONTRAST  - Feb 05 2005  3:00PM   RESULT:     The patient is having low back and LEFT lower extremity  discomfort.   Sagittal and axial T1 and effectively T2 weighted images were obtained  through the lumbar spine.   The lumbar vertebral bodies are preserved in height. The intervertebral  disc  space heights appear reasonably well maintained. The conus medullaris and  cauda equina are normal in signal intensity and position. Axial imaging  was  performed from the mid body of T12 to the upper aspect of S1. At T12-L1  the  AP dimension of the CSF filled thecal sac measures approximately 14.0 mm  and  the neural foramina appear widely patent. At L1-2 the AP dimension of the  thecal sac measures also 14.0 mm and the neural foramina are patent. At  the  L2-3 level, the AP dimension of the thecal sac measures approximately 11.0  mm. This is due to a circumferential annular disc bulge. Minimal  encroachment upon the neural foramina bilaterally is seen. At L3-4 there  is  straightening of the anterior border of the thecal sac due to an annular  bulge. The AP dimension of the CSF filled thecal sac is approximately 10.0  mm. Minimal encroachment upon the neural foramina bilaterally due to the  annular bulge is seen. At the L4-5 level, there is an annular bulge that  decreases the AP dimension of the thecal sac to 8.0 mm. It does produce  mild  encroachment upon the neural foramina and mass effect upon the L5 nerve  roots as they course to the level below. There is no discrete focal HNP. A  contribution is made to neural foraminal narrowing by facet joint  hypertrophy. There is some ligamentum flavum hypertrophy here as well.   At the L5-S1 level, the AP dimension  of the CSF filled thecal sac is  approximately 11.0 mm. There is mild encroachment upon the neural foramina  due to an annular bulge and facet joint hypertrophy.   IMPRESSION:   1.     There is multilevel, very mild stenosis of the spinal canal that is  multifactorial. Contributions are made by annular disc bulging as well as  ligamentum flavum hypertrophy and facet joint hypertrophy. There is  moderate  to severe stenosis at the L4-5 level where the AP dimension of the CSF  filled thecal sac is decreased to as little as approximately 8.0 mm. Mild  encroachment upon the neural foramina bilaterally is seen. Of note is the  fact that there is abundant spinal canal fat posterior to the thecal sac  but  this does limits the capacity of the bony spinal canal.  2.     At no level is there a discrete focal HNP resulting in the spinal  stenosis.       Lumbar DG 2-3 views:  Results for orders placed in visit on 07/03/13  DG Lumbar Spine 2-3 Views   Narrative * PRIOR REPORT IMPORTED FROM AN EXTERNAL SYSTEM *   PRIOR REPORT IMPORTED FROM THE [facility] WORKFLOW SYSTEM   REASON FOR EXAM:    lumbar stenosis and bursitis in both hips  COMMENTS:   PROCEDURE:  DXR - DXR LUMBAR SPINE AP AND LATERAL  - Jul 03 2013   4:46PM   RESULT:     History: Lumbar stenosis.   Comparison Study: No recent.   Findings: Diffuse degenerative change and osteopenia present. Pedicles are  intact. No acute abnormality. Vascular calcification present.   IMPRESSION:      Diffuse degenerative change. No acute abnormality.       Note: Imaging results reviewed.  ROS  Cardiovascular History: Negative for hypertension, coronary artery diseas, myocardial infraction, anticoagulant therapy or heart failure Pulmonary or Respiratory History: Shortness of breath and Smoker Neurological History: Negative for epilepsy, stroke, urinary or fecal inontinence, spina bifida or tethered cord syndrome Review of Past  Neurological Studies: No results found for this or any previous visit. Psychological-Psychiatric History: Anxiety, Panic Attacks and Insomnia Gastrointestinal History: Irritable Bowel Syndrome (IBS) Genitourinary History: Hematuria Hematological History: Negative for anticoagulant therapy, anemia, bruising or bleeding easily, hemophilia, sickle cell disease or trait, thrombocytopenia or coagulupathies Endocrine History: Negative for diabetes or thyroid disease Rheumatologic History: Fibromyalgia and Chronic Fatigue Syndrome Musculoskeletal History: Negative for myasthenia gravis, muscular dystrophy, multiple sclerosis or malignant hyperthermia Work History: Disabled. The patient describes being disabled since 11/13/2015 due to fibromyalgia.  Allergies  Ms. Mcwhinney is allergic to sulfa antibiotics and prozac  [fluoxetine].  Laboratory Chemistry  Inflammation Markers No results found for: ESRSEDRATE, CRP  Renal Function Lab Results  Component Value Date   BUN 15 07/18/2016   CREATININE 0.62 07/18/2016   GFRAA >89 07/18/2016   GFRNONAA >89 07/18/2016    Hepatic Function Lab Results  Component Value Date   AST 11 07/18/2016   ALT 6 07/18/2016   ALBUMIN 3.7 07/18/2016    Electrolytes Lab Results  Component Value Date   NA 143 07/18/2016   K 4.1 07/18/2016   CL 105 07/18/2016   CALCIUM 9.0 07/18/2016    Pain Modulating Vitamins Lab Results  Component Value Date   VD25OH 14 (L) 07/18/2016    Coagulation Parameters Lab Results  Component Value Date   PLT 319 07/18/2016    Cardiovascular Lab Results  Component Value Date   HGB 13.5 07/18/2016   HCT 41.2 07/18/2016   Note: Lab results reviewed.  [place]  Medical:  Ms. Isola  has a past medical history of Anxiety; Anxiety; Arthritis; Depression; Fibromyalgia; Hematuria (06/21/2016); and Neuromuscular disorder ([place]). Family: family history is not on file. Surgical:  has a past surgical history that includes Abdominal  hysterectomy; Tonsillectomy and adenoidectomy (Bilateral); Oophorectomy; and Appendectomy. Tobacco:  reports that she has been smoking Cigarettes.  She has been smoking about 1.00 pack per day. She has never used smokeless tobacco. Alcohol:  has no alcohol history on file. Drug:  reports that she does not use drugs. Active Ambulatory Problems    Diagnosis Date Noted  . Chronic lumbar pain 05/12/2015  . Fibromyalgia 05/12/2015  . Generalized anxiety disorder 05/12/2015  . Chronic low back pain (Location of Secondary source of pain) (Bilateral) (R>L) 08/15/2015  . CD (contact dermatitis) 08/15/2015  . Lumbosacral neuritis 02/23/2010  . Menopausal and perimenopausal disorder 08/15/2015  . Chronic pain 08/15/2015  . Menopausal symptom 08/15/2015  . Chronic pain syndrome 07/12/2010  . Trochanteric bursitis (Bilateral) 08/16/2015  . Hot flash, menopausal 08/16/2015  . Compulsive tobacco user syndrome 08/16/2015  . Abnormal mammogram 11/08/2015  . Tongue lesion 06/21/2016  . Annual physical exam 07/18/2016  . Mass of chest wall, left 07/18/2016  . Colon cancer screening 07/18/2016  . Long  term current use of opiate analgesic 07/24/2016  . Long term prescription opiate use 07/24/2016  . Opiate use 07/24/2016  . Encounter for therapeutic drug level monitoring 07/24/2016  . Encounter for pain management planning 07/24/2016  . Long term prescription benzodiazepine use 07/24/2016  . Chronic hip pain (Location of Primary Source of Pain) (Bilateral) (R>L) 07/24/2016  . Smoker 07/25/2016  . Cigarette nicotine dependence 07/25/2016  . Lumbar facet syndrome (Bilateral) (R>L) 07/25/2016  . Chronic sacroiliac joint pain (Bilateral) (R>L) 07/25/2016  . Chronic shoulder pain (Left) 07/25/2016  . Peripheral neuropathy (HCC) (lower extremity) (Bilateral) (L>R) 07/25/2016  . Neurogenic pain 07/25/2016  . Neuropathic pain 07/25/2016  . Musculoskeletal pain 07/25/2016  . Chronic neck pain (Bilateral)  (L>R) 07/25/2016  . Chest pain of uncertain etiology [id number]  . Chronic lower extremity pain (Bilateral) (L>R) 07/26/2016  . Chronic upper extremity pain (Bilateral) (L>R) 07/26/2016   Resolved Ambulatory Problems    Diagnosis Date Noted  . Hematuria 06/21/2016   Past Medical History:  Diagnosis Date  . Anxiety   . Anxiety   . Arthritis   . Depression   . Fibromyalgia   . Hematuria 06/21/2016  . Neuromuscular disorder ([place])     Constitutional Exam  General appearance: Well nourished, well developed, and well hydrated. In no acute distress Vitals:   07/25/16 0750  BP: 117/72  Pulse: (!) 179  Resp: 14  Temp: 98.2 F (36.8 C)  TempSrc: Oral  SpO2: 100%  Weight: 145 lb (65.8 kg)  Height: 5\' 4"  (1.626 m)  BMI Assessment: Estimated body mass index is 24.89 kg/m as calculated from the following:   Height as of this encounter: 5\' 4"  (1.626 m).   Weight as of this encounter: 145 lb (65.8 kg).   BMI interpretation: (18.5-24.9 kg/m2) = Ideal body weight BMI Readings from Last 4 Encounters:  07/26/16 25.03 kg/m  07/26/16 25.06 kg/m  07/25/16 24.89 kg/m  07/18/16 24.74 kg/m   Wt Readings from Last 4 Encounters:  07/26/16 145 lb 12.8 oz (66.1 kg)  07/26/16 146 lb (66.2 kg)  07/25/16 145 lb (65.8 kg)  07/18/16 144 lb 2 oz (65.4 kg)  Psych/Mental status: Alert and oriented x 3 (person, place, & time) Eyes: PERLA Respiratory: No evidence of acute respiratory distress  Cervical Spine Exam  Inspection: No masses, redness, or swelling Alignment: Symmetrical Functional ROM: ROM appears unrestricted Stability: No instability detected Muscle strength & Tone: Functionally intact Sensory: Unimpaired Palpation: Non-contributory  Upper Extremity (UE) Exam    Side: Right upper extremity  Side: Left upper extremity  Inspection: No masses, redness, swelling, or asymmetry  Inspection: No masses, redness, swelling, or asymmetry  Functional ROM: ROM appears unrestricted           Functional ROM: ROM appears unrestricted          Muscle strength & Tone: Functionally intact  Muscle strength & Tone: Functionally intact  Sensory: Unimpaired  Sensory: Unimpaired  Palpation: Non-contributory  Palpation: Non-contributory   Thoracic Spine Exam  Inspection: No masses, redness, or swelling Alignment: Symmetrical Functional ROM: ROM appears unrestricted Stability: No instability detected Sensory: Unimpaired Muscle strength & Tone: Functionally intact Palpation: Non-contributory  Lumbar Spine Exam  Inspection: No masses, redness, or swelling Alignment: Symmetrical Functional ROM: Limited ROM Stability: No instability detected Muscle strength & Tone: Functionally intact Sensory: Movement-associated discomfort Palpation: Complains of area being tender to palpation Provocative Tests: Lumbar Hyperextension and rotation test: Positive bilaterally for facet joint pain. Patrick's Maneuver: evaluation deferred today  Gait & Posture Assessment  Ambulation: Unassisted Gait: Relatively normal for age and body habitus Posture: WNL   Lower Extremity Exam    Side: Right lower extremity  Side: Left lower extremity  Inspection: No masses, redness, swelling, or asymmetry  Inspection: No masses, redness, swelling, or asymmetry  Functional ROM: ROM appears unrestricted          Functional ROM: ROM appears unrestricted          Muscle strength & Tone: Functionally intact  Muscle strength & Tone: Functionally intact  Sensory: Unimpaired  Sensory: Unimpaired  Palpation: Non-contributory  Palpation: Non-contributory    Assessment  Primary Diagnosis & Pertinent Problem List: The primary encounter diagnosis was Chronic pain. Diagnoses of Long term current use of opiate analgesic, Long term prescription opiate use, Opiate use, Encounter for therapeutic drug level monitoring, Encounter for pain management planning, Long term prescription benzodiazepine use, Chronic hip pain  (Right), Chronic LBP, Fibromyalgia, Lumbosacral neuritis, Smoker, Cigarette nicotine dependence without complication, Lumbar facet syndrome (Bilateral) (R>L), Chronic sacroiliac joint pain (Bilateral) (R>L), Chronic shoulder pain (Left), Peripheral polyneuropathy (HCC), Trochanteric bursitis (Bilateral), Neurogenic pain, Neuropathic pain, Musculoskeletal pain, Chronic neck pain (Bilateral) (L>R), Chronic pain of lower extremity, unspecified laterality, and Chronic pain of both upper extremities were also pertinent to this visit.  Visit Diagnosis: 1. Chronic pain   2. Long term current use of opiate analgesic   3. Long term prescription opiate use   4. Opiate use   5. Encounter for therapeutic drug level monitoring   6. Encounter for pain management planning   7. Long term prescription benzodiazepine use   8. Chronic hip pain (Right)   9. Chronic LBP   10. Fibromyalgia   11. Lumbosacral neuritis   12. Smoker   13. Cigarette nicotine dependence without complication   14. Lumbar facet syndrome (Bilateral) (R>L)   15. Chronic sacroiliac joint pain (Bilateral) (R>L)   16. Chronic shoulder pain (Left)   17. Peripheral polyneuropathy ([place])   18. Trochanteric bursitis (Bilateral)   19. Neurogenic pain   20. Neuropathic pain   21. Musculoskeletal pain   22. Chronic neck pain (Bilateral) (L>R)   23. Chronic pain of lower extremity, unspecified laterality   24. Chronic pain of both upper extremities     Assessment: No problem-specific Assessment & Plan notes found for this encounter.   Plan of Care  Initial Treatment Plan:  Please be advised that as per protocol, today's visit has been an evaluation only. We have not taken over the patient's controlled substance management.  Problem List Items Addressed This Visit      High   Chronic hip pain (Location of Primary Source of Pain) (Bilateral) (R>L) (Chronic)   Chronic low back pain (Location of Secondary source of pain) (Bilateral) (R>L)  (Chronic)   Chronic lower extremity pain (Bilateral) (L>R) (Chronic)   Chronic neck pain (Bilateral) (L>R) (Chronic)   Relevant Orders   DG Cervical Spine Complete   Chronic pain - Primary (Chronic)   Relevant Orders   Compliance Drug Analysis, Ur   Comprehensive metabolic panel   C-reactive protein   Magnesium   Sedimentation rate   25-Hydroxyvitamin D Lcms D2+D3   Vitamin B12   Chronic sacroiliac joint pain (Bilateral) (R>L) (Chronic)   Relevant Orders   DG Si Joints   Chronic shoulder pain (Left) (Chronic)   Relevant Orders   DG Shoulder Left   Chronic upper extremity pain (Bilateral) (L>R) (Chronic)   Fibromyalgia (Chronic)   Lumbar facet  syndrome (Bilateral) (R>L) (Chronic)   Relevant Orders   DG Lumbar Spine Complete W/Bend   Lumbosacral neuritis   Relevant Orders   DG Lumbar Spine Complete W/Bend   Musculoskeletal pain (Chronic)   Neurogenic pain (Chronic)   Relevant Orders   NCV with EMG(electromyography)   Neuropathic pain (Chronic)   Relevant Orders   NCV with EMG(electromyography)   Peripheral neuropathy (HCC) (lower extremity) (Bilateral) (L>R) (Chronic)   Relevant Orders   NCV with EMG(electromyography)   Trochanteric bursitis (Bilateral) (Chronic)   Relevant Orders   DG HIP UNILAT W OR W/O PELVIS 2-3 VIEWS LEFT   DG HIP UNILAT W OR W/O PELVIS 2-3 VIEWS RIGHT     Medium   Cigarette nicotine dependence (Chronic)   Relevant Orders   Nicotine/cotinine metabolites   Encounter for pain management planning   Encounter for therapeutic drug level monitoring   Long term current use of opiate analgesic (Chronic)   Relevant Orders   Ambulatory referral to Psychology   Long term prescription benzodiazepine use (Chronic)   Long term prescription opiate use (Chronic)   Opiate use (Chronic)   Smoker (Chronic)   Relevant Orders   Nicotine/cotinine metabolites    Other Visit Diagnoses   None.     Lab-work & Procedure Ordered: Orders Placed This Encounter   Procedures  . DG Cervical Spine Complete  . DG Lumbar Spine Complete W/Bend  . DG Shoulder Left  . DG Si Joints  . DG HIP UNILAT W OR W/O PELVIS 2-3 VIEWS LEFT  . DG HIP UNILAT W OR W/O PELVIS 2-3 VIEWS RIGHT  . Compliance Drug Analysis, Ur  . Comprehensive metabolic panel  . C-reactive protein  . Magnesium  . Sedimentation rate  . 25-Hydroxyvitamin D Lcms D2+D3  . Vitamin B12  . Nicotine/cotinine metabolites  . Ambulatory referral to Psychology  . NCV with EMG(electromyography)    Pharmacotherapy: Medications ordered:  No orders of the defined types were placed in this encounter.  Prescriptions ordered during this visit: New Prescriptions   VITAMIN D, ERGOCALCIFEROL, (DRISDOL) 50000 UNITS CAPS CAPSULE    Take 1 capsule (50,000 Units total) by mouth once a week. For 12 weeks   Medications administered during this visit: Ms. Gagan had no medications administered during this visit.   Pharmacotherapy plan under consideration:  The patient has been informed about the CDC guidelines. The patient has been informed that we will not be using any opioids in combination with benzodiazepines. In addition to this, the patient has been informed that should we not find any clear documentation of pathology for which the opioids are indicated, we will not be writing for them. In addition, the patient has been informed that she needs to stop smoking so as to eliminate nicotine from her system. The nicotine accelerates the metabolism of the opioids and increases its requirements.    Interventional Therapies: Interventional procedures under consideration:  Possible diagnostic intra-articular hip joint injections under fluoroscopic guidance, with or without sedation.  Possible diagnostic bilateral lumbar facet block under fluoroscopic guidance and IV sedation.  Possible left shoulder injection.  Possible left  Lumbar epidural steroid injection under fluoroscopic guidance, with or without  sedation.  Possible diagnostic bilateral lumbar sympathetic block under fluoroscopic guidance and IV sedation.  Possible left-sided cervical epidural steroid injection under fluoroscopic guidance, with or without sedation sedation.  Possible diagnostic bilateral lumbar facet block under fluoroscopic guidance and IV sedation.    Referral(s) or Consult(s): Medical psychology consult for substance use disorder evaluation  Requested PM Follow-up: Return for 2nd Visit Eval, After [facility] Eval.  Future Appointments Date Time Provider [place]  07/31/2016 1:00 PM [facility]  07/31/2016 1:30 PM [facility]  08/23/2016 2:40 PM Syed Richmond Campbell, MD [place] None  08/24/2016 1:30 PM Hollice Espy, MD [facility] None    Primary Care Physician: Ashok Norris, MD Location: [facility] Note by: Kathlen Brunswick. Dossie Arbour, M.D, DABA, DABAPM, DABPM, DABIPP, FIPP  Pain Score Disclaimer: We use the NRS-11 scale. This is a self-reported, subjective measurement of pain severity with only modest accuracy. It is used primarily to identify changes within a particular patient. It must be understood that outpatient pain scales are significantly less accurate that those used for research, where they can be applied under ideal controlled circumstances with minimal exposure to variables. In reality, the score is likely to be a combination of pain intensity and pain affect, where pain affect describes the degree of emotional arousal or changes in action readiness caused by the sensory experience of pain. Factors such as social and work situation, setting, emotional state, anxiety levels, expectation, and prior pain experience may influence pain perception and show large inter-individual differences that may also be affected by time variables.  Patient instructions provided during this appointment: There are no Patient Instructions on file for this  visit.

## 2016-07-25 ENCOUNTER — Encounter: Payer: Self-pay | Admitting: Pain Medicine

## 2016-07-25 ENCOUNTER — Telehealth: Payer: Self-pay

## 2016-07-25 ENCOUNTER — Ambulatory Visit: Payer: Medicaid Other | Attending: Pain Medicine | Admitting: Pain Medicine

## 2016-07-25 ENCOUNTER — Other Ambulatory Visit: Payer: Self-pay

## 2016-07-25 VITALS — BP 117/72 | HR 179 | Temp 98.2°F | Resp 14 | Ht 64.0 in | Wt 145.0 lb

## 2016-07-25 DIAGNOSIS — M7061 Trochanteric bursitis, right hip: Secondary | ICD-10-CM

## 2016-07-25 DIAGNOSIS — Z79899 Other long term (current) drug therapy: Secondary | ICD-10-CM | POA: Diagnosis not present

## 2016-07-25 DIAGNOSIS — M25512 Pain in left shoulder: Secondary | ICD-10-CM

## 2016-07-25 DIAGNOSIS — M5417 Radiculopathy, lumbosacral region: Secondary | ICD-10-CM

## 2016-07-25 DIAGNOSIS — G8929 Other chronic pain: Secondary | ICD-10-CM | POA: Diagnosis not present

## 2016-07-25 DIAGNOSIS — M797 Fibromyalgia: Secondary | ICD-10-CM

## 2016-07-25 DIAGNOSIS — F411 Generalized anxiety disorder: Secondary | ICD-10-CM | POA: Insufficient documentation

## 2016-07-25 DIAGNOSIS — M79606 Pain in leg, unspecified: Secondary | ICD-10-CM

## 2016-07-25 DIAGNOSIS — M25552 Pain in left hip: Secondary | ICD-10-CM | POA: Insufficient documentation

## 2016-07-25 DIAGNOSIS — M79602 Pain in left arm: Secondary | ICD-10-CM

## 2016-07-25 DIAGNOSIS — M545 Low back pain, unspecified: Secondary | ICD-10-CM

## 2016-07-25 DIAGNOSIS — M4806 Spinal stenosis, lumbar region: Secondary | ICD-10-CM | POA: Insufficient documentation

## 2016-07-25 DIAGNOSIS — M47816 Spondylosis without myelopathy or radiculopathy, lumbar region: Secondary | ICD-10-CM | POA: Insufficient documentation

## 2016-07-25 DIAGNOSIS — F1190 Opioid use, unspecified, uncomplicated: Secondary | ICD-10-CM | POA: Diagnosis not present

## 2016-07-25 DIAGNOSIS — M533 Sacrococcygeal disorders, not elsewhere classified: Secondary | ICD-10-CM | POA: Diagnosis not present

## 2016-07-25 DIAGNOSIS — M25551 Pain in right hip: Secondary | ICD-10-CM | POA: Diagnosis present

## 2016-07-25 DIAGNOSIS — Z79891 Long term (current) use of opiate analgesic: Secondary | ICD-10-CM

## 2016-07-25 DIAGNOSIS — M542 Cervicalgia: Secondary | ICD-10-CM

## 2016-07-25 DIAGNOSIS — M79604 Pain in right leg: Secondary | ICD-10-CM | POA: Insufficient documentation

## 2016-07-25 DIAGNOSIS — Z5181 Encounter for therapeutic drug level monitoring: Secondary | ICD-10-CM

## 2016-07-25 DIAGNOSIS — Z7982 Long term (current) use of aspirin: Secondary | ICD-10-CM | POA: Diagnosis not present

## 2016-07-25 DIAGNOSIS — L259 Unspecified contact dermatitis, unspecified cause: Secondary | ICD-10-CM | POA: Diagnosis not present

## 2016-07-25 DIAGNOSIS — M792 Neuralgia and neuritis, unspecified: Secondary | ICD-10-CM | POA: Insufficient documentation

## 2016-07-25 DIAGNOSIS — M791 Myalgia: Secondary | ICD-10-CM

## 2016-07-25 DIAGNOSIS — F17210 Nicotine dependence, cigarettes, uncomplicated: Secondary | ICD-10-CM | POA: Insufficient documentation

## 2016-07-25 DIAGNOSIS — M79601 Pain in right arm: Secondary | ICD-10-CM

## 2016-07-25 DIAGNOSIS — M79605 Pain in left leg: Secondary | ICD-10-CM | POA: Insufficient documentation

## 2016-07-25 DIAGNOSIS — G629 Polyneuropathy, unspecified: Secondary | ICD-10-CM

## 2016-07-25 DIAGNOSIS — G894 Chronic pain syndrome: Secondary | ICD-10-CM | POA: Insufficient documentation

## 2016-07-25 DIAGNOSIS — Z720 Tobacco use: Secondary | ICD-10-CM

## 2016-07-25 DIAGNOSIS — F17200 Nicotine dependence, unspecified, uncomplicated: Secondary | ICD-10-CM

## 2016-07-25 DIAGNOSIS — M7062 Trochanteric bursitis, left hip: Secondary | ICD-10-CM

## 2016-07-25 DIAGNOSIS — M7918 Myalgia, other site: Secondary | ICD-10-CM

## 2016-07-25 DIAGNOSIS — N951 Menopausal and female climacteric states: Secondary | ICD-10-CM | POA: Insufficient documentation

## 2016-07-25 DIAGNOSIS — Z0189 Encounter for other specified special examinations: Secondary | ICD-10-CM

## 2016-07-25 NOTE — Telephone Encounter (Signed)
Gastroenterology Pre-Procedure Review  Request Date: 09/11/2016 Requesting Physician: Dr. Manuella Ghazi  PATIENT REVIEW QUESTIONS: The patient responded to the following health history questions as indicated:    1. Are you having any GI issues? no 2. Do you have a personal history of Polyps? no 3. Do you have a family history of Colon Cancer or Polyps? no 4. Diabetes Mellitus? no 5. Joint replacements in the past 12 months?no 6. Major health problems in the past 3 months?no 7. Any artificial heart valves, MVP, or defibrillator?no    MEDICATIONS & ALLERGIES:    Patient reports the following regarding taking any anticoagulation/antiplatelet therapy:   Plavix, Coumadin, Eliquis, Xarelto, Lovenox, Pradaxa, Brilinta, or Effient? no Aspirin? yes (headaches)  Patient confirms/reports the following medications:  Current Outpatient Prescriptions  Medication Sig Dispense Refill  . ALPRAZolam (XANAX) 1 MG tablet Take 1 tablet (1 mg total) by mouth 4 (four) times daily. 84 tablet 0  . aspirin 325 MG tablet Take 325 mg by mouth daily.    Marland Kitchen gabapentin (NEURONTIN) 300 MG capsule Take 1 capsule (300 mg total) by mouth 4 (four) times daily. 120 capsule 0  . HYDROcodone-acetaminophen (NORCO) 7.5-325 MG tablet Take 1 tablet by mouth every 8 (eight) hours as needed for moderate pain. 90 tablet 0  . tiZANidine (ZANAFLEX) 4 MG tablet Take 1 tablet (4 mg total) by mouth every 8 (eight) hours as needed. 90 tablet 0   No current facility-administered medications for this visit.     Patient confirms/reports the following allergies:  Allergies  Allergen Reactions  . Sulfa Antibiotics Nausea Only  . Prozac  [Fluoxetine]     No orders of the defined types were placed in this encounter.   AUTHORIZATION INFORMATION Primary Insurance: 1D#: Group #:  Secondary Insurance: 1D#: Group #:  SCHEDULE INFORMATION: Date: 09/11/2016 Time: Location: [place]

## 2016-07-25 NOTE — Telephone Encounter (Signed)
I scheduled her colonoscopy for 09/11/2016 withouth realising that it was already booked. I need to reschedule her appointment to another Tuesday at [facility].

## 2016-07-25 NOTE — Telephone Encounter (Signed)
Screening Colonoscopy Z12.11 Nausea R11.2 [facility] 09/11/2016 Medicaid (no prior auth is required)

## 2016-07-26 ENCOUNTER — Telehealth: Payer: Self-pay

## 2016-07-26 ENCOUNTER — Encounter: Payer: Self-pay | Admitting: Family Medicine

## 2016-07-26 ENCOUNTER — Ambulatory Visit (INDEPENDENT_AMBULATORY_CARE_PROVIDER_SITE_OTHER): Payer: Medicaid Other | Admitting: Family Medicine

## 2016-07-26 ENCOUNTER — Ambulatory Visit (INDEPENDENT_AMBULATORY_CARE_PROVIDER_SITE_OTHER): Payer: Medicaid Other | Admitting: Urology

## 2016-07-26 ENCOUNTER — Encounter: Payer: Self-pay | Admitting: Urology

## 2016-07-26 VITALS — BP 121/85 | HR 88 | Ht 64.0 in | Wt 146.0 lb

## 2016-07-26 DIAGNOSIS — M797 Fibromyalgia: Secondary | ICD-10-CM

## 2016-07-26 DIAGNOSIS — F17210 Nicotine dependence, cigarettes, uncomplicated: Secondary | ICD-10-CM

## 2016-07-26 DIAGNOSIS — R3129 Other microscopic hematuria: Secondary | ICD-10-CM

## 2016-07-26 DIAGNOSIS — G8929 Other chronic pain: Secondary | ICD-10-CM | POA: Insufficient documentation

## 2016-07-26 DIAGNOSIS — R1031 Right lower quadrant pain: Secondary | ICD-10-CM

## 2016-07-26 DIAGNOSIS — M79606 Pain in leg, unspecified: Secondary | ICD-10-CM

## 2016-07-26 DIAGNOSIS — R0789 Other chest pain: Secondary | ICD-10-CM | POA: Diagnosis not present

## 2016-07-26 DIAGNOSIS — M79603 Pain in arm, unspecified: Secondary | ICD-10-CM

## 2016-07-26 DIAGNOSIS — R079 Chest pain, unspecified: Secondary | ICD-10-CM | POA: Insufficient documentation

## 2016-07-26 LAB — MICROSCOPIC EXAMINATION: Bacteria, UA: NONE SEEN

## 2016-07-26 LAB — URINALYSIS, COMPLETE
Bilirubin, UA: NEGATIVE
Glucose, UA: NEGATIVE
KETONES UA: NEGATIVE
LEUKOCYTES UA: NEGATIVE
Nitrite, UA: NEGATIVE
PH UA: 5.5 (ref 5.0–7.5)
PROTEIN UA: NEGATIVE
Urobilinogen, Ur: 0.2 mg/dL (ref 0.2–1.0)

## 2016-07-26 MED ORDER — HYDROCODONE-ACETAMINOPHEN 7.5-325 MG PO TABS: 1.0000 | ORAL_TABLET | Freq: Three times a day (TID) | ORAL | 0 refills | Status: DC | PRN

## 2016-07-26 MED ORDER — VITAMIN D (ERGOCALCIFEROL) 1.25 MG (50000 UNIT) PO CAPS: 50000.0000 [IU] | ORAL_CAPSULE | ORAL | 0 refills | Status: DC

## 2016-07-26 NOTE — Progress Notes (Signed)
07/26/2016 2:23 PM   Patricia Rollins 1961/10/25 [id number]  Referring provider: Ashok Norris, MD [address] [place], [place] [address]  Chief Complaint  Patient presents with  . Hematuria    New Patient    HPI: 55 yo F referred for further evaluation of microscopic hematuria.    She reported to her PCP that she had blood in her urine for 2 weeks with a "sensation" with urinating without frank dysuria.  UA showed 10-20 RBC/ HPF but otherwise negative.   UCx at that time showed 50-100 K mixed flora.    She also reports that she has been having bleeding but not today.  She notes blood on the toilet tissue when she wipes with his light pink.  She notes that this is light pink.  S/p complete hysterectomy.  She also notes that she has had some chronic right lower quadrant pain which comes and goes for years since her hysterectomy.  She has a follow up with OB/GYN, [facility].  Repeat UA today showed 3-10 RBC/ HPF, otherwise negative.  She denies history of gross hematuria.    She is a current smoker.  She smokes 1 ppd x 25 years.    No history of kidney stones or flank painn.   She does have chronic low back pain.   PMH: Past Medical History:  Diagnosis Date  . Anxiety   . Anxiety   . Arthritis    patient has bilateral bursitis in hips  . Depression   . Fibromyalgia   . Hematuria 06/21/2016  . Neuromuscular disorder [facility])     Surgical History: Past Surgical History:  Procedure Laterality Date  . ABDOMINAL HYSTERECTOMY    . APPENDECTOMY    . OOPHORECTOMY    . TONSILLECTOMY AND ADENOIDECTOMY Bilateral     Home Medications:    Medication List       Accurate as of 07/26/16 11:59 PM. Always use your most recent med list.          ALPRAZolam 1 MG tablet Commonly known as:  XANAX Take 1 tablet (1 mg total) by mouth 4 (four) times daily.   aspirin 325 MG tablet Take 325 mg by mouth daily.   gabapentin 300 MG capsule Commonly known as:   NEURONTIN Take 1 capsule (300 mg total) by mouth 4 (four) times daily.   HYDROcodone-acetaminophen 7.5-325 MG tablet Commonly known as:  NORCO Take 1 tablet by mouth every 8 (eight) hours as needed for moderate pain.   tiZANidine 4 MG tablet Commonly known as:  ZANAFLEX Take 1 tablet (4 mg total) by mouth every 8 (eight) hours as needed.   Vitamin D (Ergocalciferol) 50000 units Caps capsule Commonly known as:  DRISDOL Take 1 capsule (50,000 Units total) by mouth once a week. For 12 weeks       Allergies:  Allergies  Allergen Reactions  . Sulfa Antibiotics Nausea Only  . Prozac  [Fluoxetine]     Family History: Family History  Problem Relation Age of Onset  . Bladder Cancer Neg Hx   . Kidney cancer Neg Hx   . Prostate cancer Neg Hx     Social History:  reports that she has been smoking Cigarettes.  She has been smoking about 1.00 pack per day. She has never used smokeless tobacco. She reports that she does not use drugs. Her alcohol history is not on file.  ROS: UROLOGY Frequent Urination?: No Hard to postpone urination?: Yes Burning/pain with urination?: No Get  up at night to urinate?: No Leakage of urine?: No Urine stream starts and stops?: No Trouble starting stream?: No Do you have to strain to urinate?: No Blood in urine?: Yes Urinary tract infection?: Yes Sexually transmitted disease?: No Injury to kidneys or bladder?: No Painful intercourse?: No Weak stream?: No Currently pregnant?: No Vaginal bleeding?: Yes Last menstrual period?: n  Gastrointestinal Nausea?: Yes Vomiting?: No Indigestion/heartburn?: No Diarrhea?: No Constipation?: No  Constitutional Fever: No Night sweats?: Yes Weight loss?: No Fatigue?: Yes  Skin Skin rash/lesions?: No Itching?: No  Eyes Blurred vision?: Yes Double vision?: No  Ears/Nose/Throat Sore throat?: No Sinus problems?: Yes  Hematologic/Lymphatic Swollen glands?: No Easy bruising?:  No  Cardiovascular Leg swelling?: No Chest pain?: No  Respiratory Cough?: No Shortness of breath?: Yes  Endocrine Excessive thirst?: No  Musculoskeletal Back pain?: Yes Joint pain?: Yes  Neurological Headaches?: Yes Dizziness?: Yes  Psychologic Depression?: No Anxiety?: Yes  Physical Exam: BP 121/85   Pulse 88   Ht 5\' 4"  (1.626 m)   Wt 146 lb (66.2 kg)   BMI 25.06 kg/m   Constitutional:  Alert and oriented, No acute distress.  Strong tobacco odor.   HEENT: [place] AT, moist mucus membranes.  Trachea midline, no masses. Cardiovascular: No clubbing, cyanosis, or edema. Respiratory: Normal respiratory effort, no increased work of breathing. GI: Abdomen is soft,  nondistended, no abdominal masses.  Mild tenderness in the right lower quadrant with deep palpation.   GU: No CVA tenderness.  Skin: No rashes, bruises or suspicious lesions. Lymph: No cervical or inguinal adenopathy. Neurologic: Grossly intact, no focal deficits, moving all 4 extremities. Psychiatric: Normal mood and affect.  Laboratory Data: Lab Results  Component Value Date   WBC 7.3 07/18/2016   HGB 13.5 07/18/2016   HCT 41.2 07/18/2016   MCV 96.5 07/18/2016   PLT 319 07/18/2016    Lab Results  Component Value Date   CREATININE 0.62 07/18/2016    Urinalysis Results for orders placed or performed in visit on 07/26/16  Microscopic Examination  Result Value Ref Range   WBC, UA 0-5 0 - 5 /hpf   RBC, UA 3-10 (A) 0 - 2 /hpf   Epithelial Cells (non renal) 0-10 0 - 10 /hpf   Bacteria, UA None seen None seen/Few  Urinalysis, Complete  Result Value Ref Range   Specific Gravity, UA <1.005 (L) 1.005 - 1.030   pH, UA 5.5 5.0 - 7.5   Color, UA Yellow Yellow   Appearance Ur Clear Clear   Leukocytes, UA Negative Negative   Protein, UA Negative Negative/Trace   Glucose, UA Negative Negative   Ketones, UA Negative Negative   RBC, UA 2+ (A) Negative   Bilirubin, UA Negative Negative   Urobilinogen, Ur  0.2 0.2 - 1.0 mg/dL   Nitrite, UA Negative Negative   Microscopic Examination See below:     Pertinent Imaging: n/a  Assessment & Plan:    1. Microscopic/ gross hematuria We discussed the differential diagnosis for microscopic/ gross hematuria including nephrolithiasis, renal or upper tract tumors, bladder stones, UTIs, or bladder tumors as well as undetermined etiologies. Per AUA guidelines, I did recommend complete microscopic hematuria evaluation including CTU, possible urine cytology, and office cystoscopy. - Urinalysis, Complete - CT HEMATURIA WORKUP; Future  2. Cigarette nicotine dependence without complication Advised smoking cessation and its causal relationship to bladder cancer  3. Right lower quadrant abdominal pain Chronic right lower quadrant pain which is appreciated on exam today. This has been present since  the time of her hysterectomy. We will evaluate further with CT urogram.   Return in about 4 weeks (around 08/23/2016) for f/u CT urogram, cystoscopy.  Hollice Espy, MD  [facility] [address], [place], [place] [address] [phone]

## 2016-07-26 NOTE — Telephone Encounter (Signed)
Patient has been notified of lab results and a prescription for Vitamin D3 50,000 units take 1 capsule once a week for 12 weeks has been sent to [facility] per Dr. Manuella Ghazi, patient has been notified and verbalized understanding.

## 2016-07-26 NOTE — Progress Notes (Signed)
Name: Patricia Rollins   MRN: [id number]    DOB: 1961/04/14   Date:07/26/2016       Progress Note  Subjective  Chief Complaint  Chief Complaint  Patient presents with  . Follow-up    Discuss lab results    HPI  Pt. Is here to request referral to have her heart checked. She feels as if 'something is wrong with my heart'. She is undergoing multiple screening and tests including CT chest, bladder scan, initial appointment work up with Pain management etc and wishes to make sure her heart is ok.  She feels nauseated in mornings, feels a 'twinge' in her chest at times as if someone is pulling a 'tug', has intermittent shortness of breath.   Past Medical History:  Diagnosis Date  . Anxiety   . Anxiety   . Arthritis    patient has bilateral bursitis in hips  . Depression   . Fibromyalgia   . Hematuria 06/21/2016  . Neuromuscular disorder [facility])     Past Surgical History:  Procedure Laterality Date  . ABDOMINAL HYSTERECTOMY    . APPENDECTOMY    . OOPHORECTOMY    . TONSILLECTOMY AND ADENOIDECTOMY Bilateral     Family History  Problem Relation Age of Onset  . Bladder Cancer Neg Hx   . Kidney cancer Neg Hx   . Prostate cancer Neg Hx     Social History   Social History  . Marital status: Single    Spouse name: N/A  . Number of children: N/A  . Years of education: N/A   Occupational History  . Not on file.   Social History Main Topics  . Smoking status: Current Every Day Smoker    Packs/day: 1.00    Types: Cigarettes  . Smokeless tobacco: Never Used  . Alcohol use Not on file  . Drug use: No  . Sexual activity: No   Other Topics Concern  . Not on file   Social History Narrative  . No narrative on file     Current Outpatient Prescriptions:  .  ALPRAZolam (XANAX) 1 MG tablet, Take 1 tablet (1 mg total) by mouth 4 (four) times daily., Disp: 84 tablet, Rfl: 0 .  aspirin 325 MG tablet, Take 325 mg by mouth daily., Disp: , Rfl:  .  gabapentin (NEURONTIN) 300 MG  capsule, Take 1 capsule (300 mg total) by mouth 4 (four) times daily., Disp: 120 capsule, Rfl: 0 .  HYDROcodone-acetaminophen (NORCO) 7.5-325 MG tablet, Take 1 tablet by mouth every 8 (eight) hours as needed for moderate pain., Disp: 90 tablet, Rfl: 0 .  tiZANidine (ZANAFLEX) 4 MG tablet, Take 1 tablet (4 mg total) by mouth every 8 (eight) hours as needed., Disp: 90 tablet, Rfl: 0 .  Vitamin D, Ergocalciferol, (DRISDOL) 50000 units CAPS capsule, Take 1 capsule (50,000 Units total) by mouth once a week. For 12 weeks, Disp: 12 capsule, Rfl: 0  Allergies  Allergen Reactions  . Sulfa Antibiotics Nausea Only  . Prozac  [Fluoxetine]      Review of Systems  Constitutional: Positive for malaise/fatigue. Negative for chills and fever.  Respiratory: Positive for shortness of breath.   Cardiovascular: Positive for chest pain.  Gastrointestinal: Positive for abdominal pain and nausea. Negative for vomiting.  Psychiatric/Behavioral: Positive for depression. The patient is nervous/anxious and has insomnia.     Objective  Vitals:   07/26/16 1333  BP: 116/63  Pulse: 97  Resp: 16  Temp: 98.3 F (36.8 C)  TempSrc: Oral  SpO2: 96%  Weight: 145 lb 12.8 oz (66.1 kg)  Height: 5\' 4"  (1.626 m)    Physical Exam  Constitutional: She is oriented to person, place, and time and well-developed, well-nourished, and in no distress.  HENT:  Head: Normocephalic and atraumatic.  Cardiovascular: Normal rate, regular rhythm and normal heart sounds.   No murmur heard. Pulmonary/Chest: Effort normal and breath sounds normal. She has no wheezes.  Abdominal: Soft. Bowel sounds are normal. There is no tenderness.  Musculoskeletal: She exhibits no edema.  Neurological: She is alert and oriented to person, place, and time.  Nursing note and vitals reviewed.   Assessment & Plan  1. Chest pain of uncertain etiology EKG is normal sinus rhythm, chest pain may be a manifestation of fibromyalgia versus GERD. We'll  refer to cardiology for workup - EKG 12-Lead - Ambulatory referral to Cardiology  2. Fibromyalgia She has been referred to pain clinic, but will not receive any pain medications until a complete workup and assessment has been performed by the provider. We'll provide one month refill for hydrocodone, compliant with controlled substances agreement - HYDROcodone-acetaminophen (NORCO) 7.5-325 MG tablet; Take 1 tablet by mouth every 8 (eight) hours as needed for moderate pain.  Dispense: 90 tablet; Refill: 0  Jaidyn Kuhl [facility] 07/26/2016 1:41 PM

## 2016-07-26 NOTE — Telephone Encounter (Signed)
Patricia Rollins called me back. I decided to keep her on for the 31st because we will most likely have a cancellation on that day, making the schedule even. Patricia Rollins is fine with that.

## 2016-07-30 ENCOUNTER — Encounter: Payer: Self-pay | Admitting: Urology

## 2016-07-31 ENCOUNTER — Ambulatory Visit: Admission: RE | Admit: 2016-07-31 | Payer: Medicaid Other | Source: Ambulatory Visit

## 2016-07-31 ENCOUNTER — Ambulatory Visit
Admission: RE | Admit: 2016-07-31 | Discharge: 2016-07-31 | Disposition: A | Discharge status: Home / Self Care | Payer: Medicaid Other | Source: Ambulatory Visit | Attending: Pain Medicine | Admitting: Pain Medicine

## 2016-07-31 ENCOUNTER — Ambulatory Visit
Admission: RE | Admit: 2016-07-31 | Discharge: 2016-07-31 | Disposition: A | Discharge status: Home / Self Care | Payer: Medicaid Other | Source: Ambulatory Visit | Attending: Family Medicine | Admitting: Family Medicine

## 2016-07-31 ENCOUNTER — Inpatient Hospital Stay: Admission: RE | Admit: 2016-07-31 | Payer: Medicaid Other | Source: Ambulatory Visit

## 2016-07-31 DIAGNOSIS — M1288 Other specific arthropathies, not elsewhere classified, other specified site: Secondary | ICD-10-CM | POA: Insufficient documentation

## 2016-07-31 DIAGNOSIS — M25512 Pain in left shoulder: Secondary | ICD-10-CM

## 2016-07-31 DIAGNOSIS — M47896 Other spondylosis, lumbar region: Secondary | ICD-10-CM | POA: Diagnosis not present

## 2016-07-31 DIAGNOSIS — M542 Cervicalgia: Secondary | ICD-10-CM | POA: Diagnosis not present

## 2016-07-31 DIAGNOSIS — M5417 Radiculopathy, lumbosacral region: Secondary | ICD-10-CM | POA: Diagnosis not present

## 2016-07-31 DIAGNOSIS — M25551 Pain in right hip: Secondary | ICD-10-CM

## 2016-07-31 DIAGNOSIS — I700 Atherosclerosis of aorta: Secondary | ICD-10-CM | POA: Insufficient documentation

## 2016-07-31 DIAGNOSIS — M4806 Spinal stenosis, lumbar region: Secondary | ICD-10-CM | POA: Diagnosis not present

## 2016-07-31 DIAGNOSIS — M7061 Trochanteric bursitis, right hip: Secondary | ICD-10-CM | POA: Diagnosis not present

## 2016-07-31 DIAGNOSIS — M545 Low back pain: Secondary | ICD-10-CM | POA: Insufficient documentation

## 2016-07-31 DIAGNOSIS — M47816 Spondylosis without myelopathy or radiculopathy, lumbar region: Secondary | ICD-10-CM

## 2016-07-31 DIAGNOSIS — M7062 Trochanteric bursitis, left hip: Secondary | ICD-10-CM | POA: Insufficient documentation

## 2016-07-31 DIAGNOSIS — G8929 Other chronic pain: Secondary | ICD-10-CM | POA: Diagnosis not present

## 2016-07-31 DIAGNOSIS — R222 Localized swelling, mass and lump, trunk: Secondary | ICD-10-CM

## 2016-07-31 DIAGNOSIS — M4316 Spondylolisthesis, lumbar region: Secondary | ICD-10-CM | POA: Diagnosis not present

## 2016-07-31 DIAGNOSIS — X58XXXA Exposure to other specified factors, initial encounter: Secondary | ICD-10-CM | POA: Insufficient documentation

## 2016-07-31 DIAGNOSIS — R3129 Other microscopic hematuria: Secondary | ICD-10-CM | POA: Diagnosis not present

## 2016-07-31 DIAGNOSIS — S42032A Displaced fracture of lateral end of left clavicle, initial encounter for closed fracture: Secondary | ICD-10-CM | POA: Diagnosis not present

## 2016-07-31 DIAGNOSIS — M533 Sacrococcygeal disorders, not elsewhere classified: Secondary | ICD-10-CM | POA: Insufficient documentation

## 2016-07-31 DIAGNOSIS — M50321 Other cervical disc degeneration at C4-C5 level: Secondary | ICD-10-CM | POA: Diagnosis not present

## 2016-07-31 DIAGNOSIS — M8580 Other specified disorders of bone density and structure, unspecified site: Secondary | ICD-10-CM | POA: Diagnosis not present

## 2016-07-31 MED ORDER — IOPAMIDOL (ISOVUE-300) INJECTION 61%
125.0000 mL | Freq: Once | INTRAVENOUS | Status: AC | PRN
  Administered 2016-07-31: 125 mL via INTRAVENOUS

## 2016-08-01 ENCOUNTER — Institutional Professional Consult (permissible substitution): Payer: Medicaid Other | Admitting: Internal Medicine

## 2016-08-01 LAB — COMPLIANCE DRUG ANALYSIS, UR

## 2016-08-07 ENCOUNTER — Telehealth: Payer: Self-pay | Admitting: Family Medicine

## 2016-08-07 NOTE — Telephone Encounter (Signed)
Patient is checking status on test results from last week. Please return call

## 2016-08-08 NOTE — Telephone Encounter (Signed)
Results have already been dictated from her physical from 07/18/2016

## 2016-08-09 ENCOUNTER — Encounter: Payer: Self-pay | Admitting: Pain Medicine

## 2016-08-09 DIAGNOSIS — F1290 Cannabis use, unspecified, uncomplicated: Secondary | ICD-10-CM | POA: Insufficient documentation

## 2016-08-09 NOTE — Progress Notes (Signed)
NOTE: This forensic urine drug screen (UDS) test was conducted using a state-of-the-art ultra high performance liquid chromatography and mass spectrometry system (UPLC/MS-MS), the most sophisticated and accurate method available. UPLC/MS-MS is 1,000 times more precise and accurate than standard gas chromatography and mass spectrometry (GC/MS). This system can analyze 26 drug categories and 180 drug compounds.  Positive, undisclosed use of illicit substance (Cannabinoids). Our program has a "Zero Tolerance" for the use of illicit substances. As a consequence of the results obtained on this test, we will no longer offer controlled substances as a therapeutic option for this patient, until the UDS return clear. In addition, we will check to see if this is a recurrent event. Today the patient will be given a final warning and informed that should this event happen again, we will no longer continue to offer opioids as an alternative treatment option. In the event that our records reveal that this warning had previously been given to the patient, we will then immediately discontinue this mode of therapy.

## 2016-08-15 ENCOUNTER — Institutional Professional Consult (permissible substitution): Payer: Medicaid Other | Admitting: Internal Medicine

## 2016-08-21 ENCOUNTER — Telehealth: Payer: Self-pay | Admitting: Urology

## 2016-08-21 ENCOUNTER — Ambulatory Visit (INDEPENDENT_AMBULATORY_CARE_PROVIDER_SITE_OTHER): Payer: Medicaid Other | Admitting: Obstetrics and Gynecology

## 2016-08-21 ENCOUNTER — Encounter: Payer: Self-pay | Admitting: Obstetrics and Gynecology

## 2016-08-21 VITALS — BP 128/78 | HR 76 | Ht 64.0 in | Wt 144.5 lb

## 2016-08-21 DIAGNOSIS — Z1231 Encounter for screening mammogram for malignant neoplasm of breast: Secondary | ICD-10-CM

## 2016-08-21 DIAGNOSIS — N950 Postmenopausal bleeding: Secondary | ICD-10-CM | POA: Diagnosis not present

## 2016-08-21 DIAGNOSIS — N952 Postmenopausal atrophic vaginitis: Secondary | ICD-10-CM | POA: Diagnosis not present

## 2016-08-21 DIAGNOSIS — R3121 Asymptomatic microscopic hematuria: Secondary | ICD-10-CM

## 2016-08-21 DIAGNOSIS — Z1239 Encounter for other screening for malignant neoplasm of breast: Secondary | ICD-10-CM

## 2016-08-21 DIAGNOSIS — N951 Menopausal and female climacteric states: Secondary | ICD-10-CM | POA: Diagnosis not present

## 2016-08-21 NOTE — Progress Notes (Signed)
GYNECOLOGY CLINIC PROGRESS NOTE Subjective:     Patricia Rollins is a 55 y.o. female with remote h/o hysterectomy and BSO, who presents as a referral from [facility] (Dr. Keith Rake) for evaluation of PMB.   Current complaints: vaginal bleeding vs hematuria x 4-5 months.  Notes that she was recently seen by Urology and ruled out for urinary cause of bleeding. She does request to know the results of a recent CT scan performed due to patient's h/o chronic intermittent RLQ pain.  Patient is also noting night sweats, hot flushes, and mood swings.  Has a h/o HRT use immediately after hysterectomy ~ 18 years ago, but has not been on any treatment for "quite some time".    Patient also desires breast exam, as she notes that her PCP does not perform.   Gynecologic History No LMP recorded. Patient has had a hysterectomy. Last Pap: 7-8 years ago. Results were: normal.  Denies h/o abnormal pap smears.  Last mammogram: 02/2012. Results were: abnormal (BI-RADS: Category 4 - Suspicious Abnormality).  Was receiving q 6 months until ~2015, when patient stopped going.     Past Medical History:  Diagnosis Date  . Anxiety   . Anxiety   . Arthritis    patient has bilateral bursitis in hips  . Depression   . Fibromyalgia   . Hematuria 06/21/2016  . Neuromuscular disorder ([place])     Family History  Problem Relation Age of Onset  . Ovarian cancer Mother   . Pancreatic cancer Mother   . Bladder Cancer Neg Hx   . Kidney cancer Neg Hx   . Prostate cancer Neg Hx     Past Surgical History:  Procedure Laterality Date  . ABDOMINAL HYSTERECTOMY    . APPENDECTOMY    . OOPHORECTOMY    . TONSILLECTOMY AND ADENOIDECTOMY Bilateral     Social History   Social History  . Marital status: Single    Spouse name: N/A  . Number of children: N/A  . Years of education: N/A   Occupational History  . Not on file.   Social History Main Topics  . Smoking status: Current Every Day Smoker   Packs/day: 1.00    Types: Cigarettes  . Smokeless tobacco: Never Used  . Alcohol use No  . Drug use: No  . Sexual activity: No   Other Topics Concern  . Not on file   Social History Narrative  . No narrative on file    Current Outpatient Prescriptions on File Prior to Visit  Medication Sig Dispense Refill  . ALPRAZolam (XANAX) 1 MG tablet Take 1 tablet (1 mg total) by mouth 4 (four) times daily. 84 tablet 0  . aspirin 325 MG tablet Take 325 mg by mouth daily.    Marland Kitchen gabapentin (NEURONTIN) 300 MG capsule Take 1 capsule (300 mg total) by mouth 4 (four) times daily. (Patient taking differently: Take 300 mg by mouth every other day. ) 120 capsule 0  . HYDROcodone-acetaminophen (NORCO) 7.5-325 MG tablet Take 1 tablet by mouth every 8 (eight) hours as needed for moderate pain. 90 tablet 0  . Vitamin D, Ergocalciferol, (DRISDOL) 50000 units CAPS capsule Take 1 capsule (50,000 Units total) by mouth once a week. For 12 weeks 12 capsule 0   No current facility-administered medications on file prior to visit.     Allergies  Allergen Reactions  . Sulfa Antibiotics Nausea Only  . Prozac  [Fluoxetine]      Review of Systems  Pertinent items noted in HPI and remainder of comprehensive ROS otherwise negative.    Objective:    BP 128/78 (BP Location: Left Arm, Patient Position: Sitting, Cuff Size: Normal)   Pulse 76   Ht 5\' 4"  (1.626 m)   Wt 144 lb 8 oz (65.5 kg)   BMI 24.80 kg/m  General appearance: alert and no distress Neck: no adenopathy, no carotid bruit, no JVD, supple, symmetrical, trachea midline and thyroid not enlarged, symmetric, no tenderness/mass/nodules  Breasts: breasts appear normal, no suspicious masses, no skin or nipple changes or axillary nodes.  Abdomen: soft, non-tender; bowel sounds normal; no masses,  no organomegaly Pelvic: Normal external genitalia.  Vagina with moderate atrophy, no lesions or discharge.  Vaginal cuff well healed. Uterus, cervix, and ovaries  surgically absent.  Extremities: extremities normal, atraumatic, no cyanosis or edema Neurologic: Grossly normal    Assessment:   PMB Vaginal atrophy H/o abnormal mammograms Hematuria Menopausal symptoms    Plan:   - PMB likely secondary to vaginal atrophy.  No vaginal lesions noted. Discussed management options for atrophy, including vaginal moisturizers, local estrogen therapy, or HRT (in light of patient's other menopausal symptoms). Patient desires HRT.  Discussed risks/benefits of HRT, and need for lowest dose for shortest duration.  Patient notes understanding.  Samples of Premarin 0.625 mg given.  Patient to call back in 10-14 days if samples work well in order to call in prescription.  - Mammogram ordered. - Patient with hematuria, desires to know results of CT scan.  Briefly reviewed scan, noted no renal or bladder stones. No pelvic masses. To f/u with Urology in 1 week per their last note.  Follow up in: 6 weeks to f/u medication.      Rubie Maid, MD [facility]

## 2016-08-21 NOTE — Telephone Encounter (Signed)
Patient called back to reschd her appt since you were not going to be in [place] on Fridays but she said that you were supposed to review her CT scan and let her know if she really needed the cysto or not. She said she has yet to hear from you. She said she went to her OBGYN and they talked about the scan but she is still unsure and does not have any answers. She would like to know from you if she needs the cysto, if not she is not going to reschedule the appointment. Please advise.  Sharyn Lull

## 2016-08-21 NOTE — Telephone Encounter (Signed)
She needs a cysto and this has always been my recommendation.  Please ensure this is scheduled.  We need to evaluate the lining of her bladder.     CT urogram was going to be reviewed at her follow up.    Hollice Espy, MD

## 2016-08-23 ENCOUNTER — Ambulatory Visit: Payer: Medicaid Other | Admitting: Family Medicine

## 2016-08-23 ENCOUNTER — Other Ambulatory Visit: Payer: Medicaid Other

## 2016-08-24 ENCOUNTER — Other Ambulatory Visit: Payer: Medicaid Other | Admitting: Urology

## 2016-08-25 ENCOUNTER — Encounter: Payer: Self-pay | Admitting: Obstetrics and Gynecology

## 2016-08-27 ENCOUNTER — Ambulatory Visit (INDEPENDENT_AMBULATORY_CARE_PROVIDER_SITE_OTHER): Payer: Medicaid Other | Admitting: Family Medicine

## 2016-08-27 ENCOUNTER — Other Ambulatory Visit: Payer: Self-pay | Admitting: Family Medicine

## 2016-08-27 ENCOUNTER — Encounter: Payer: Self-pay | Admitting: Family Medicine

## 2016-08-27 VITALS — BP 120/78 | HR 96 | Temp 98.1°F | Resp 16 | Ht 64.0 in | Wt 143.4 lb

## 2016-08-27 DIAGNOSIS — R222 Localized swelling, mass and lump, trunk: Secondary | ICD-10-CM | POA: Diagnosis not present

## 2016-08-27 DIAGNOSIS — J0100 Acute maxillary sinusitis, unspecified: Secondary | ICD-10-CM | POA: Diagnosis not present

## 2016-08-27 DIAGNOSIS — M797 Fibromyalgia: Secondary | ICD-10-CM | POA: Diagnosis not present

## 2016-08-27 MED ORDER — AZITHROMYCIN 250 MG PO TABS
ORAL_TABLET | ORAL | 0 refills | Status: DC
Start: 2016-08-27 — End: 2016-09-13

## 2016-08-27 MED ORDER — HYDROCODONE-ACETAMINOPHEN 7.5-325 MG PO TABS: 1.0000 | ORAL_TABLET | Freq: Three times a day (TID) | ORAL | 0 refills | Status: DC | PRN

## 2016-08-27 NOTE — Progress Notes (Signed)
Name: Patricia Rollins   MRN: [id number]    DOB: 28-Mar-1961   Date:08/27/2016       Progress Note  Subjective  Chief Complaint  Chief Complaint  Patient presents with  . Follow-up    pain med and scan results  . Sinusitis    Sinusitis  This is a new problem. The current episode started more than 1 month ago. Associated symptoms include ear pain (poppping in left ear) and sinus pressure. Pertinent negatives include no congestion, coughing, neck pain or sore throat. Past treatments include nothing (only took OTC anti-histamine, which did not provide relief.).   Fibromyalgia: Pt. Presents for follow up of Fibromyalgia, she has seen Pain Clinic (Dr. Dossie Rollins) and does not want to go back to him again, states he ordered ''16 x rays'' and she felt extremely bad after obtaining them, says there was no paperwork signed and not even a follow up appointment. SHe is willing to go to [facility] for pain management.  Pain present mainly in the lower back and both lateral hisp, intermittent pain in left shoulder. Pain rated at 7/10, taking Hydrocodone-Acetaminophen 7.5-325 mg every 8 hours as needed along with Gabapentin. States Hydrocodone works well, no side effects.   Past Medical History:  Diagnosis Date  . Anxiety   . Anxiety   . Arthritis    patient has bilateral bursitis in hips  . Depression   . Fibromyalgia   . Hematuria 06/21/2016  . Neuromuscular disorder [facility])     Past Surgical History:  Procedure Laterality Date  . ABDOMINAL HYSTERECTOMY    . APPENDECTOMY    . OOPHORECTOMY    . TONSILLECTOMY AND ADENOIDECTOMY Bilateral     Family History  Problem Relation Age of Onset  . Ovarian cancer Mother   . Pancreatic cancer Mother   . Bladder Cancer Neg Hx   . Kidney cancer Neg Hx   . Prostate cancer Neg Hx     Social History   Social History  . Marital status: Single    Spouse name: N/A  . Number of children: N/A  . Years of education: N/A   Occupational History  .  Not on file.   Social History Main Topics  . Smoking status: Current Every Day Smoker    Packs/day: 1.00    Types: Cigarettes  . Smokeless tobacco: Never Used  . Alcohol use No  . Drug use: No  . Sexual activity: No   Other Topics Concern  . Not on file   Social History Narrative  . No narrative on file     Current Outpatient Prescriptions:  .  ALPRAZolam (XANAX) 1 MG tablet, Take 1 tablet (1 mg total) by mouth 4 (four) times daily., Disp: 84 tablet, Rfl: 0 .  aspirin 325 MG tablet, Take 325 mg by mouth daily., Disp: , Rfl:  .  gabapentin (NEURONTIN) 300 MG capsule, Take 1 capsule (300 mg total) by mouth 4 (four) times daily. (Patient taking differently: Take 300 mg by mouth every other day. ), Disp: 120 capsule, Rfl: 0 .  HYDROcodone-acetaminophen (NORCO) 7.5-325 MG tablet, Take 1 tablet by mouth every 8 (eight) hours as needed for moderate pain., Disp: 90 tablet, Rfl: 0 .  Vitamin D, Ergocalciferol, (DRISDOL) 50000 units CAPS capsule, Take 1 capsule (50,000 Units total) by mouth once a week. For 12 weeks, Disp: 12 capsule, Rfl: 0  Allergies  Allergen Reactions  . Sulfa Antibiotics Nausea Only  . Prozac  [Fluoxetine]  Review of Systems  HENT: Positive for ear pain (poppping in left ear) and sinus pressure. Negative for congestion and sore throat.   Respiratory: Negative for cough.   Cardiovascular: Negative for chest pain.  Musculoskeletal: Positive for back pain and joint pain. Negative for neck pain.  Psychiatric/Behavioral: Negative for depression. The patient is nervous/anxious.     Objective  There were no vitals filed for this visit.  Physical Exam  Constitutional: She is oriented to person, place, and time and well-developed, well-nourished, and in no distress.  HENT:  Head: Normocephalic and atraumatic.  Right Ear: Tympanic membrane and ear canal normal.  Left Ear: Tympanic membrane and ear canal normal.  Nose: Left sinus exhibits maxillary sinus  tenderness and frontal sinus tenderness.  Mouth/Throat: Posterior oropharyngeal erythema present.  Nasal mucosal inflammation, turbinates hypertrophied.  Cardiovascular: Normal rate, regular rhythm, S1 normal, S2 normal and normal heart sounds.   No murmur heard. Pulmonary/Chest: Effort normal. No respiratory distress. She has no decreased breath sounds. She has no wheezes. She has no rhonchi.  Musculoskeletal:       Lumbar back: She exhibits tenderness, pain and spasm.       Back:  Neurological: She is alert and oriented to person, place, and time.  Skin: Skin is warm, dry and intact.  Psychiatric: Mood and memory normal. She has a flat affect.  Nursing note and vitals reviewed.     Assessment & Plan  1. Fibromyalgia Stable, does not wish to go back to Dr. Dossie Rollins. Is willing to go to [facility] pain clinic in [place]. We will process referral and provide one-month prescription for hydrocodone. - HYDROcodone-acetaminophen (NORCO) 7.5-325 MG tablet; Take 1 tablet by mouth every 8 (eight) hours as needed for moderate pain.  Dispense: 90 tablet; Refill: 0  2. Acute non-recurrent maxillary sinusitis  - azithromycin (ZITHROMAX) 250 MG tablet; 2 tabs po day 1, then 1 tab po q day x 4 days  Dispense: 6 tablet; Refill: 0  3. Mass of chest wall, left We ordered a CT scan of chest without contrast for evaluation of left chest wall mass, apparently the CT has not been done and/or read, will contact radiology    Patricia Rollins Patricia Rollins 08/27/2016 1:34 PM

## 2016-08-28 ENCOUNTER — Other Ambulatory Visit: Payer: Self-pay | Admitting: Family Medicine

## 2016-08-28 ENCOUNTER — Ambulatory Visit
Admission: RE | Admit: 2016-08-28 | Discharge: 2016-08-28 | Disposition: A | Discharge status: Home / Self Care | Payer: Medicaid Other | Source: Ambulatory Visit | Attending: Family Medicine | Admitting: Family Medicine

## 2016-08-28 DIAGNOSIS — Z87891 Personal history of nicotine dependence: Secondary | ICD-10-CM | POA: Diagnosis not present

## 2016-08-28 DIAGNOSIS — J439 Emphysema, unspecified: Secondary | ICD-10-CM | POA: Insufficient documentation

## 2016-08-28 DIAGNOSIS — I700 Atherosclerosis of aorta: Secondary | ICD-10-CM | POA: Diagnosis not present

## 2016-08-28 DIAGNOSIS — R3129 Other microscopic hematuria: Secondary | ICD-10-CM

## 2016-08-28 DIAGNOSIS — I2510 Atherosclerotic heart disease of native coronary artery without angina pectoris: Secondary | ICD-10-CM | POA: Insufficient documentation

## 2016-08-28 NOTE — Addendum Note (Signed)
Encounter addended by: Larena Sox, RT on: 08/28/2016  9:15 AM<BR>    Actions taken: Order Entry activity accessed

## 2016-08-31 ENCOUNTER — Other Ambulatory Visit: Payer: Medicaid Other | Admitting: Urology

## 2016-09-04 ENCOUNTER — Telehealth: Payer: Self-pay | Admitting: Obstetrics and Gynecology

## 2016-09-04 DIAGNOSIS — N952 Postmenopausal atrophic vaginitis: Secondary | ICD-10-CM

## 2016-09-04 MED ORDER — ESTROGENS CONJUGATED 0.625 MG PO TABS: 0.6250 mg | ORAL_TABLET | Freq: Every day | ORAL | 3 refills | Status: DC

## 2016-09-04 NOTE — Telephone Encounter (Signed)
RX sent in

## 2016-09-04 NOTE — Telephone Encounter (Signed)
Pt has been trying primarin pills, she say they are working good but makes her a little sleepy. She said she would like a RX now for the pharmacy [facility] practitioner)

## 2016-09-09 NOTE — Progress Notes (Signed)
Results were reviewed and found to be: significantly abnormal  Surgical consultation is recommended  Review would suggest no procedures needed at this time

## 2016-09-12 ENCOUNTER — Telehealth: Payer: Self-pay | Admitting: Family Medicine

## 2016-09-12 NOTE — Telephone Encounter (Signed)
Patient has been notified of lab results  

## 2016-09-12 NOTE — Telephone Encounter (Signed)
Pt states she has not gotten results from her chest X Ray.Pt states she has been waiting for a few weeks for a call. Please return her call.

## 2016-09-13 ENCOUNTER — Encounter: Payer: Self-pay | Admitting: Cardiovascular Disease

## 2016-09-13 ENCOUNTER — Ambulatory Visit (INDEPENDENT_AMBULATORY_CARE_PROVIDER_SITE_OTHER): Payer: Medicaid Other | Admitting: Cardiovascular Disease

## 2016-09-13 VITALS — BP 110/80 | HR 73 | Ht 66.0 in | Wt 142.8 lb

## 2016-09-13 DIAGNOSIS — I739 Peripheral vascular disease, unspecified: Secondary | ICD-10-CM

## 2016-09-13 DIAGNOSIS — F17200 Nicotine dependence, unspecified, uncomplicated: Secondary | ICD-10-CM

## 2016-09-13 DIAGNOSIS — I25810 Atherosclerosis of coronary artery bypass graft(s) without angina pectoris: Secondary | ICD-10-CM | POA: Diagnosis not present

## 2016-09-13 DIAGNOSIS — E7800 Pure hypercholesterolemia, unspecified: Secondary | ICD-10-CM | POA: Insufficient documentation

## 2016-09-13 DIAGNOSIS — Z7689 Persons encountering health services in other specified circumstances: Secondary | ICD-10-CM | POA: Diagnosis not present

## 2016-09-13 DIAGNOSIS — I700 Atherosclerosis of aorta: Secondary | ICD-10-CM

## 2016-09-13 MED ORDER — ROSUVASTATIN CALCIUM 10 MG PO TABS: 10.0000 mg | ORAL_TABLET | Freq: Every day | ORAL | 11 refills | Status: DC

## 2016-09-13 NOTE — Patient Instructions (Addendum)
Medication Instructions:   Please start crestor 1/2 pill for one month, Then increase up   To a full pill  If you get muscle ache, Try the Co enz Q10 Hold the pill for a few days   Labwork:  No new labs needed  Testing/Procedures:  No further testing at this time   Follow-Up: It was a pleasure seeing you in the office today. Please call us if you have new issues that need to be addressed before your next appt.  [phone]  Your physician wants you to follow-up in: 12 months.  You will receive a reminder letter in the mail two months in advance. If you don't receive a letter, please call our office to schedule the follow-up appointment.  If you need a refill on your cardiac medications before your next appointment, please call your pharmacy.         Steps to Quit Smoking  Smoking tobacco can be harmful to your health and can affect almost every organ in your body. Smoking puts you, and those around you, at risk for developing many serious chronic diseases. Quitting smoking is difficult, but it is one of the best things that you can do for your health. It is never too late to quit. WHAT ARE THE BENEFITS OF QUITTING SMOKING? When you quit smoking, you lower your risk of developing serious diseases and conditions, such as:  Lung cancer or lung disease, such as COPD.  Heart disease.  Stroke.  Heart attack.  Infertility.  Osteoporosis and bone fractures. Additionally, symptoms such as coughing, wheezing, and shortness of breath may get better when you quit. You may also find that you get sick less often because your body is stronger at fighting off colds and infections. If you are pregnant, quitting smoking can help to reduce your chances of having a baby of low birth weight. HOW DO I GET READY TO QUIT? When you decide to quit smoking, create a plan to make sure that you are successful. Before you quit:  Pick a date to quit. Set a date within the next two weeks to  give you time to prepare.  Write down the reasons why you are quitting. Keep this list in places where you will see it often, such as on your bathroom mirror or in your car or wallet.  Identify the people, places, things, and activities that make you want to smoke (triggers) and avoid them. Make sure to take these actions:  Throw away all cigarettes at home, at work, and in your car.  Throw away smoking accessories, such as Scientist, research (medical).  Clean your car and make sure to empty the ashtray.  Clean your home, including curtains and carpets.  Tell your family, friends, and coworkers that you are quitting. Support from your loved ones can make quitting easier.  Talk with your health care provider about your options for quitting smoking.  Find out what treatment options are covered by your health insurance. WHAT STRATEGIES CAN I USE TO QUIT SMOKING?  Talk with your healthcare provider about different strategies to quit smoking. Some strategies include:  Quitting smoking altogether instead of gradually lessening how much you smoke over a period of time. Research shows that quitting "cold [place]" is more successful than gradually quitting.  Attending in-person counseling to help you build problem-solving skills. You are more likely to have success in quitting if you attend several counseling sessions. Even short sessions of 10 minutes can be effective.  Finding resources and  support systems that can help you to quit smoking and remain smoke-free after you quit. These resources are most helpful when you use them often. They can include:  Online chats with a Social worker.  Telephone quitlines.  Printed Furniture conservator/restorer.  Support groups or group counseling.  Text messaging programs.  Mobile phone applications.  Taking medicines to help you quit smoking. (If you are pregnant or breastfeeding, talk with your health care provider first.) Some medicines contain nicotine and some do  not. Both types of medicines help with cravings, but the medicines that include nicotine help to relieve withdrawal symptoms. Your health care provider may recommend:  Nicotine patches, gum, or lozenges.  Nicotine inhalers or sprays.  Non-nicotine medicine that is taken by mouth. Talk with your health care provider about combining strategies, such as taking medicines while you are also receiving in-person counseling. Using these two strategies together makes you more likely to succeed in quitting than if you used either strategy on its own. If you are pregnant or breastfeeding, talk with your health care provider about finding counseling or other support strategies to quit smoking. Do not take medicine to help you quit smoking unless told to do so by your health care provider. WHAT THINGS CAN I DO TO MAKE IT EASIER TO QUIT? Quitting smoking might feel overwhelming at first, but there is a lot that you can do to make it easier. Take these important actions:  Reach out to your family and friends and ask that they support and encourage you during this time. Call telephone quitlines, reach out to support groups, or work with a counselor for support.  Ask people who smoke to avoid smoking around you.  Avoid places that trigger you to smoke, such as bars, parties, or smoke-break areas at work.  Spend time around people who do not smoke.  Lessen stress in your life, because stress can be a smoking trigger for some people. To lessen stress, try:  Exercising regularly.  Deep-breathing exercises.  Yoga.  Meditating.  Performing a body scan. This involves closing your eyes, scanning your body from head to toe, and noticing which parts of your body are particularly tense. Purposefully relax the muscles in those areas.  Download or purchase mobile phone or tablet apps (applications) that can help you stick to your quit plan by providing reminders, tips, and encouragement. There are many free apps,  such as QuitGuide from the [employer] Office manager for Disease Control and Prevention). You can find other support for quitting smoking (smoking cessation) through smokefree.gov and other websites. HOW WILL I FEEL WHEN I QUIT SMOKING? Within the first 24 hours of quitting smoking, you may start to feel some withdrawal symptoms. These symptoms are usually most noticeable 2-3 days after quitting, but they usually do not last beyond 2-3 weeks. Changes or symptoms that you might experience include:  Mood swings.  Restlessness, anxiety, or irritation.  Difficulty concentrating.  Dizziness.  Strong cravings for sugary foods in addition to nicotine.  Mild weight gain.  Constipation.  Nausea.  Coughing or a sore throat.  Changes in how your medicines work in your body.  A depressed mood.  Difficulty sleeping (insomnia). After the first 2-3 weeks of quitting, you may start to notice more positive results, such as:  Improved sense of smell and taste.  Decreased coughing and sore throat.  Slower heart rate.  Lower blood pressure.  Clearer skin.  The ability to breathe more easily.  Fewer sick days. Quitting smoking is very  challenging for most people. Do not get discouraged if you are not successful the first time. Some people need to make many attempts to quit before they achieve long-term success. Do your best to stick to your quit plan, and talk with your health care provider if you have any questions or concerns.   This information is not intended to replace advice given to you by your health care provider. Make sure you discuss any questions you have with your health care provider.   Document Released: 10/23/2001 Document Revised: 03/15/2015 Document Reviewed: 03/15/2015 Elsevier Interactive Patient Education 2016 [employer]. Smoking Hazards Smoking cigarettes is extremely bad for your health. Tobacco smoke has over 200 known poisons in it. It contains the poisonous gases nitrogen  oxide and carbon monoxide. There are over 60 chemicals in tobacco smoke that cause cancer. Some of the chemicals found in cigarette smoke include:   Cyanide.   Benzene.   Formaldehyde.   Methanol (wood alcohol).   Acetylene (fuel used in welding torches).   Ammonia.  Even smoking lightly shortens your life expectancy by several years. You can greatly reduce the risk of medical problems for you and your family by stopping now. Smoking is the most preventable cause of death and disease in our society. Within days of quitting smoking, your circulation improves, you decrease the risk of having a heart attack, and your lung capacity improves. There may be some increased phlegm in the first few days after quitting, and it may take months for your lungs to clear up completely. Quitting for 10 years reduces your risk of developing lung cancer to almost that of a nonsmoker.  WHAT ARE THE RISKS OF SMOKING? Cigarette smokers have an increased risk of many serious medical problems, including:  Lung cancer.   Lung disease (such as pneumonia, bronchitis, and emphysema).   Heart attack and chest pain due to the heart not getting enough oxygen (angina).   Heart disease and peripheral blood vessel disease.   Hypertension.   Stroke.   Oral cancer (cancer of the lip, mouth, or voice box).   Bladder cancer.   Pancreatic cancer.   Cervical cancer.   Pregnancy complications, including premature birth.   Stillbirths and smaller newborn babies, birth defects, and genetic damage to sperm.   Early menopause.   Lower estrogen level for women.   Infertility.   Facial wrinkles.   Blindness.   Increased risk of broken bones (fractures).   Senile dementia.   Stomach ulcers and internal bleeding.   Delayed wound healing and increased risk of complications during surgery. Because of secondhand smoke exposure, children of smokers have an increased risk of the  following:   Sudden infant death syndrome (SIDS).   Respiratory infections.   Lung cancer.   Heart disease.   Ear infections.  WHY IS SMOKING ADDICTIVE? Nicotine is the chemical agent in tobacco that is capable of causing addiction or dependence. When you smoke and inhale, nicotine is absorbed rapidly into the bloodstream through your lungs. Both inhaled and noninhaled nicotine may be addictive.  WHAT ARE THE BENEFITS OF QUITTING?  There are many health benefits to quitting smoking. Some are:   The likelihood of developing cancer and heart disease decreases. Health improvements are seen almost immediately.   Blood pressure, pulse rate, and breathing patterns start returning to normal soon after quitting.   People who quit may see an improvement in their overall quality of life.  HOW DO YOU QUIT SMOKING? Smoking is an addiction  with both physical and psychological effects, and longtime habits can be hard to change. Your health care provider can recommend:  Programs and community resources, which may include group support, education, or therapy.  Replacement products, such as patches, gum, and nasal sprays. Use these products only as directed. Do not replace cigarette smoking with electronic cigarettes (commonly called e-cigarettes). The safety of e-cigarettes is unknown, and some may contain harmful chemicals. FOR MORE INFORMATION  American Lung Association: www.lung.org  American Cancer Society: www.cancer.org   This information is not intended to replace advice given to you by your health care provider. Make sure you discuss any questions you have with your health care provider.   Document Released: 12/06/2004 Document Revised: 08/19/2013 Document Reviewed: 04/20/2013 Elsevier Interactive Patient Education 2016 [employer] WHAT IS SECONDHAND SMOKE? Secondhand smoke is smoke that comes from burning tobacco. It could be the smoke from a cigarette, a  pipe, or a cigar. Even if you are not the one smoking, secondhand smoke exposes you to the dangers of smoking. This is called involuntary, or passive, smoking. There are two types of secondhand smoke:  Sidestream smoke is the smoke that comes off the lighted end of a cigarette, pipe, or cigar.  This type of smoke has the highest amount of cancer-causing agents (carcinogens).  The particles in sidestream smoke are smaller. They get into your lungs more easily.  Mainstream smoke is the smoke that is exhaled by a person who is smoking.  This type of smoke is also dangerous to your health. HOW CAN SECONDHAND SMOKE AFFECT MY HEALTH? Studies show that there is no safe level of secondhand smoke. This smoke contains thousands of chemicals. At least 66 of them are known to cause cancer. Secondhand smoke can also cause many other health problems. It has been linked to:  Lung cancer.  Cancer of the voice box (larynx) or throat.  Cancer of the sinuses.  Brain cancer.  Bladder cancer.  Stomach cancer.  Breast cancer.  White blood cell cancers (lymphoma and leukemia).  Brain and liver tumors in children.  Heart disease and stroke in adults.  Pregnancy loss (miscarriage).  Diseases in children, such as:  Asthma.  Lung infections.  Ear infections.  Sudden infant death syndrome (SIDS).  Slow growth. WHERE CAN I BE AT RISK FOR EXPOSURE TO SECONDHAND SMOKE?   For adults, the workplace is the main source of exposure to secondhand smoke.  Your workplace should have a policy separating smoking areas from nonsmoking areas.  Smoking areas should have a system for ventilating and cleaning the air.  For children, the home may be the most dangerous place for exposure to secondhand smoke.  Children who live in apartment buildings may be at risk from smoke drifting from hallways or other people's homes.  For everyone, many public places are possible sources of exposure to secondhand  smoke.  These places include restaurants, shopping centers, and parks. HOW CAN I REDUCE MY RISK FOR EXPOSURE TO SECONDHAND SMOKE? The most important thing you can do is not smoke. Discourage family members from smoking. Other ways to reduce exposure for you and your family include the following:  Keep your home smoke free.  Make sure your child care providers do not smoke.  Warn your child about the dangers of smoking and secondhand smoke.  Do not allow smoking in your car. When someone smokes in a car, all the damaging chemicals from the smoke are confined in a small area.  Avoid  public places where smoking is allowed.   This information is not intended to replace advice given to you by your health care provider. Make sure you discuss any questions you have with your health care provider.   Document Released: 12/06/2004 Document Revised: 11/19/2014 Document Reviewed: 02/12/2014 Elsevier Interactive Patient Education [employer].

## 2016-09-13 NOTE — Progress Notes (Signed)
Cardiology Office Note  Date:  09/13/2016   ID:  CHANTE TAFF, DOB 1961/03/21, MRN [id number]  PCP:  Ashok Norris, MD   Chief Complaint  Patient presents with  . Establish Care    pt ask for refferal to have heart checked, per OV 07/26/16    HPI:  Miss Divita is a pleasant 55 year old woman with long history of smoking who continues to smoke one pack per day, COPD,CAD and PAD noted on CT scan of the chest and abdomen, hyperlipidemia, who presents by referral from Dr. Manuella Ghazi for consultation of her coronary disease and peripheral arterial disease.  She reports that she had CT scan chest and abdomen/pelvis performed in September and again in October 2017  Scan images pulled up with her in the office today. This shows mild to moderate coronary calcification in the mid LAD, proximal RCA Also has mild to moderate aortic arch calcification, moderate distal descending aorta disease, mild to moderate common iliac and bilateral femoral arterial disease Results discussed with her in detail  She reports that she started smoking age 17  Previously quit for 5 weeks using nicotine lozenges.  Continues to smoke one pack per day Tried chantix, had vivid dreams,   No regular exercise, Denies any chest pain or shortness of breath on exertion No PND orthopnea Denies any issues with her blood pressure   PMH:   has a past medical history of Anxiety; Anxiety; Arthritis; Depression; Fibromyalgia; Hematuria (06/21/2016); and Neuromuscular disorder ([place]).  PSH:    Past Surgical History:  Procedure Laterality Date  . ABDOMINAL HYSTERECTOMY    . APPENDECTOMY    . OOPHORECTOMY    . TONSILLECTOMY AND ADENOIDECTOMY Bilateral     Current Outpatient Prescriptions  Medication Sig Dispense Refill  . ALPRAZolam (XANAX) 1 MG tablet Take 1 tablet (1 mg total) by mouth 4 (four) times daily. 84 tablet 0  . aspirin 325 MG tablet Take 325 mg by mouth daily.    Marland Kitchen estrogens, conjugated, (PREMARIN) 0.625 MG tablet  Take 1 tablet (0.625 mg total) by mouth daily. 90 tablet 3  . HYDROcodone-acetaminophen (NORCO) 7.5-325 MG tablet Take 1 tablet by mouth every 8 (eight) hours as needed for moderate pain. 90 tablet 0  . Vitamin D, Ergocalciferol, (DRISDOL) 50000 units CAPS capsule Take 1 capsule (50,000 Units total) by mouth once a week. For 12 weeks 12 capsule 0  . rosuvastatin (CRESTOR) 10 MG tablet Take 1 tablet (10 mg total) by mouth daily. 30 tablet 11   No current facility-administered medications for this visit.      Allergies:   Sulfa antibiotics and Prozac  [fluoxetine]   Social History:  The patient  reports that she has been smoking Cigarettes.  She has been smoking about 1.00 pack per day. She has never used smokeless tobacco. She reports that she does not drink alcohol or use drugs.   Family History:   family history includes Ovarian cancer in her mother; Pancreatic cancer in her mother.    Review of Systems: Review of Systems  Constitutional: Negative.   Respiratory: Negative.   Cardiovascular: Negative.   Gastrointestinal: Negative.   Musculoskeletal: Negative.   Neurological: Negative.   Psychiatric/Behavioral: Negative.   All other systems reviewed and are negative.    PHYSICAL EXAM: VS:  BP 110/80 (BP Location: Left Arm, Patient Position: Sitting, Cuff Size: Normal)   Pulse 73   Ht 5\' 6"  (1.676 m)   Wt 142 lb 12.8 oz (64.8 kg)   SpO2 98%  BMI 23.05 kg/m  , BMI Body mass index is 23.05 kg/m. GEN: Well nourished, well developed, in no acute distress  HEENT: normal  Neck: no JVD, carotid bruits, or masses Cardiac: RRR; no murmurs, rubs, or gallops,no edema  Respiratory:  clear to auscultation bilaterally, normal work of breathing GI: soft, nontender, nondistended, + BS MS: no deformity or atrophy  Skin: warm and dry, no rash Neuro:  Strength and sensation are intact Psych: euthymic mood, full affect    Recent Labs: 07/18/2016: ALT 6; BUN 15; Creat 0.62; Hemoglobin  13.5; Platelets 319; Potassium 4.1; Sodium 143; TSH 0.70    Lipid Panel Lab Results  Component Value Date   CHOL 205 (H) 07/18/2016   HDL 54 07/18/2016   LDLCALC 122 07/18/2016   TRIG 143 07/18/2016      Wt Readings from Last 3 Encounters:  09/13/16 142 lb 12.8 oz (64.8 kg)  08/27/16 143 lb 6.4 oz (65 kg)  08/21/16 144 lb 8 oz (65.5 kg)       ASSESSMENT AND PLAN:  Encounter to establish care with new doctor - EKG 12-Lead  Smoker Long discussion concerning smoking cessation She will try lozenges She does not want Chantix or other modalities Strongly recommended she quit  Coronary artery disease involving coronary bypass graft of native heart without angina pectoris Images shown to her in detail, she denies any chest pain symptoms concerning for angina. recommended aggressive management of her risk factors.  Including smoking cessation, treatment of her cholesterol  If she does develop any shortness of breath or chest pain in the future, would recommend stress testing   Aortic atherosclerosis ([facility])  mild to moderate in nature, shown to her in the office today   PAD (peripheral artery disease) ([place])  denies any claudication type symptoms  Mild to moderate disease of the common iliac, femoral arteries bilaterally, distal descending aorta   Pure hypercholesterolemia Recommended she start Crestor 5 mg daily for one month and up to 10 mg daily   goal LDL less than 70   Total encounter time more than 45 minutes  Greater than 50% was spent in counseling and coordination of care with the patient   Disposition:   F/U  12 months  No orders of the defined types were placed in this encounter.    Signed, Esmond Plants, M.D., Ph.D. 09/13/2016  [place], [place]

## 2016-09-19 ENCOUNTER — Other Ambulatory Visit: Payer: Medicaid Other | Admitting: Urology

## 2016-09-26 ENCOUNTER — Other Ambulatory Visit: Payer: Self-pay

## 2016-09-26 ENCOUNTER — Telehealth: Payer: Self-pay | Admitting: Obstetrics and Gynecology

## 2016-09-26 ENCOUNTER — Telehealth: Payer: Self-pay

## 2016-09-26 NOTE — Telephone Encounter (Signed)
Pt calling for pap results.

## 2016-09-26 NOTE — Telephone Encounter (Signed)
tient called stating she hasn't received her packet for her colonoscopy. I gave her the option to come pick it up. Patient declined. I will mail her another packet for her double on 10/09/2016

## 2016-09-27 NOTE — Telephone Encounter (Signed)
Called pt no answer, LM informing pt that there was no PAP performed at her visit as she does not have a cervix.

## 2016-10-02 ENCOUNTER — Ambulatory Visit: Payer: Medicaid Other | Admitting: Family Medicine

## 2016-10-03 ENCOUNTER — Encounter: Payer: Self-pay | Admitting: *Deleted

## 2016-10-03 ENCOUNTER — Ambulatory Visit (INDEPENDENT_AMBULATORY_CARE_PROVIDER_SITE_OTHER): Payer: Medicaid Other | Admitting: Family Medicine

## 2016-10-03 ENCOUNTER — Other Ambulatory Visit: Payer: Self-pay | Admitting: Family Medicine

## 2016-10-03 ENCOUNTER — Encounter: Payer: Self-pay | Admitting: Family Medicine

## 2016-10-03 VITALS — BP 110/80 | HR 107 | Temp 98.8°F | Resp 17 | Ht 66.0 in | Wt 142.3 lb

## 2016-10-03 DIAGNOSIS — I739 Peripheral vascular disease, unspecified: Secondary | ICD-10-CM

## 2016-10-03 DIAGNOSIS — N959 Unspecified menopausal and perimenopausal disorder: Secondary | ICD-10-CM | POA: Diagnosis not present

## 2016-10-03 DIAGNOSIS — R222 Localized swelling, mass and lump, trunk: Secondary | ICD-10-CM | POA: Diagnosis not present

## 2016-10-03 DIAGNOSIS — I25810 Atherosclerosis of coronary artery bypass graft(s) without angina pectoris: Secondary | ICD-10-CM | POA: Diagnosis not present

## 2016-10-03 NOTE — Progress Notes (Signed)
Name: Patricia Rollins   MRN: [id number]    DOB: 1961-11-11   Date:10/03/2016       Progress Note  Subjective  Chief Complaint  Chief Complaint  Patient presents with  . Follow-up    cardiology    HPI  Pt. Presents for follow up after seeing Gynecology and Cardiology. She has been seen by Gynecology for vaginal atrophy, started on Premarin and states that 'it didn't agree with me', so she quit taking it.  Regarding Cardiology, she was seen CT Scan Chest, abdomen, and Pelvis showed Coronary Artery Disease and Peripheral Vascular Disease, has been started on Crestor 5 mg which caused her to have muscle soreness and she quit taking that as well.   Past Medical History:  Diagnosis Date  . Anxiety   . Anxiety   . Arthritis    patient has bilateral bursitis in hips  . Depression   . Fibromyalgia   . Hematuria 06/21/2016  . Neuromuscular disorder [facility])     Past Surgical History:  Procedure Laterality Date  . ABDOMINAL HYSTERECTOMY    . APPENDECTOMY    . OOPHORECTOMY    . TONSILLECTOMY AND ADENOIDECTOMY Bilateral     Family History  Problem Relation Age of Onset  . Ovarian cancer Mother   . Pancreatic cancer Mother   . Bladder Cancer Neg Hx   . Kidney cancer Neg Hx   . Prostate cancer Neg Hx     Social History   Social History  . Marital status: Single    Spouse name: N/A  . Number of children: N/A  . Years of education: N/A   Occupational History  . Not on file.   Social History Main Topics  . Smoking status: Current Every Day Smoker    Packs/day: 1.00    Types: Cigarettes  . Smokeless tobacco: Never Used  . Alcohol use No  . Drug use: No  . Sexual activity: No   Other Topics Concern  . Not on file   Social History Narrative  . No narrative on file     Current Outpatient Prescriptions:  .  ALPRAZolam (XANAX) 1 MG tablet, Take 1 tablet (1 mg total) by mouth 4 (four) times daily., Disp: 84 tablet, Rfl: 0 .  aspirin 325 MG tablet, Take 325 mg by mouth  daily., Disp: , Rfl:  .  HYDROcodone-acetaminophen (NORCO) 7.5-325 MG tablet, Take 1 tablet by mouth every 8 (eight) hours as needed for moderate pain., Disp: 90 tablet, Rfl: 0 .  Vitamin D, Ergocalciferol, (DRISDOL) 50000 units CAPS capsule, Take 1 capsule (50,000 Units total) by mouth once a week. For 12 weeks, Disp: 12 capsule, Rfl: 0 .  estrogens, conjugated, (PREMARIN) 0.625 MG tablet, Take 1 tablet (0.625 mg total) by mouth daily. (Patient not taking: Reported on 10/03/2016), Disp: 90 tablet, Rfl: 3 .  rosuvastatin (CRESTOR) 10 MG tablet, Take 1 tablet (10 mg total) by mouth daily. (Patient not taking: Reported on 10/03/2016), Disp: 30 tablet, Rfl: 11  Allergies  Allergen Reactions  . Sulfa Antibiotics Nausea Only  . Prozac  [Fluoxetine]      ROS  For a complete list of review of systems, please see history of present illness  Objective  Vitals:   10/03/16 1052  BP: 110/80  Pulse: (!) 107  Resp: 17  Temp: 98.8 F (37.1 C)  TempSrc: Oral  SpO2: 95%  Weight: 142 lb 4.8 oz (64.5 kg)  Height: 5\' 6"  (1.676 m)    Physical Exam  Constitutional: She is oriented to person, place, and time and well-developed, well-nourished, and in no distress.  HENT:  Head: Normocephalic and atraumatic.  Cardiovascular: Normal rate, regular rhythm and normal heart sounds.   No murmur heard. Pulmonary/Chest: Effort normal and breath sounds normal. She has no wheezes.  Abdominal: Soft. Bowel sounds are normal. There is no tenderness.  Neurological: She is alert and oriented to person, place, and time.  Psychiatric: Mood, memory, affect and judgment normal.  Nursing note and vitals reviewed.       Assessment & Plan  1. Menopausal and perimenopausal disorder Patient was started on Premarin by gynecology, she has had some unspecific side effects to the medication and she has stopped taking it. Recommended that she consult gynecologist for evaluation and possibly changing  prescription.  2. Mass of chest wall, left  - Ambulatory referral to General Surgery  3. PAD (peripheral artery disease) ([place]) Reviewed notes from cardiology, risk factor modification with statin, encouraged to quit smoking  4. Coronary artery disease involving coronary bypass graft of native heart without angina pectoris   Patricia Rollins [facility] 10/03/2016 11:11 AM

## 2016-10-08 ENCOUNTER — Telehealth: Payer: Self-pay | Admitting: Gastroenterology

## 2016-10-08 NOTE — Telephone Encounter (Signed)
Patient is having a colonoscopy tomorrow and was wondering if the medicine that she's supposed to drink tonight, contains the sulfur that she's allergic to? Also, after she drinks the medicine at 5pm, can she still have chicken broth and hard candy or does she have to be NPO? Please advise.

## 2016-10-09 ENCOUNTER — Encounter: Payer: Self-pay | Admitting: Certified Registered"

## 2016-10-09 ENCOUNTER — Encounter: Payer: Medicaid Other | Admitting: Obstetrics and Gynecology

## 2016-10-09 ENCOUNTER — Ambulatory Visit: Admission: RE | Admit: 2016-10-09 | Payer: Medicaid Other | Source: Ambulatory Visit | Admitting: Gastroenterology

## 2016-10-09 ENCOUNTER — Encounter: Admission: RE | Payer: Self-pay | Source: Ambulatory Visit

## 2016-10-09 SURGERY — COLONOSCOPY WITH PROPOFOL
Anesthesia: General

## 2016-10-10 ENCOUNTER — Telehealth: Payer: Self-pay

## 2016-10-10 NOTE — Telephone Encounter (Signed)
Patient canceled her colonoscopy because she got sick. She needs to schedule another colonoscopy

## 2016-10-11 ENCOUNTER — Telehealth: Payer: Self-pay | Admitting: Cardiovascular Disease

## 2016-10-11 ENCOUNTER — Telehealth: Payer: Self-pay | Admitting: Emergency Medicine

## 2016-10-11 NOTE — Telephone Encounter (Signed)
Pt c/o medication issue:  1. Name of Medication: Crestor   2. How are you currently taking this medication (dosage and times per day)? Started taking a half a day   3. Are you having a reaction (difficulty breathing--STAT)? No   4. What is your medication issue? Just adding more muscle aches  Would like to see if we have another option  Would like to know if she can lovastatin  Please advise

## 2016-10-11 NOTE — Telephone Encounter (Signed)
Please advise pt having issuesmoody.

## 2016-10-11 NOTE — Telephone Encounter (Signed)
Patient has been referred to pain clinic and should continue to receive opioids from the pain clinic. As far as vitamin D is concerned, she can return to repeat levels after she has finished taking the prescription for vitamin D

## 2016-10-11 NOTE — Telephone Encounter (Signed)
Consider trying coenzyme Q10 with the Crestor Okay to try alternate statin  lovastatin 20 mg daily

## 2016-10-11 NOTE — Telephone Encounter (Signed)
Left message for pt to call back  °

## 2016-10-11 NOTE — Telephone Encounter (Signed)
Spoke w/ pt.  She was advised at last ov:  "Please start crestor 1/2 pill for one month, Then increase up   To a full pill  If you get muscle ache, Try the Co enz Q10 Hold the pill for a few days "  She reports that she has only taken a 1/2 pill, but the myalgias are too severe to increase to whole pill.  She has not tried the CoQ10 b/c she does not feel that it will help. She would like an alternative chol med sent in, that this was discussed w/ her at last ov.  Advised her that I will make Dr. Rockey Situ aware of her concerns and call her back w/ his recommendation.

## 2016-10-11 NOTE — Telephone Encounter (Signed)
Patient called and would like to come by on tomorrow to pick up script for pain medication. Would like a script for Vitamin D called in if she is to continue this med

## 2016-10-11 NOTE — Telephone Encounter (Signed)
Disregard error .  Pt having issues with crestor.

## 2016-10-12 MED ORDER — LOVASTATIN 20 MG PO TABS: 20.0000 mg | ORAL_TABLET | Freq: Every day | ORAL | 6 refills | Status: DC

## 2016-10-12 NOTE — Telephone Encounter (Signed)
Spoke w/ pt.  Advised her of Dr. Donivan Scull recommendation.  She verbalizes understanding and will call back is unable to tolerate lovastatin.

## 2016-10-16 ENCOUNTER — Telehealth: Payer: Self-pay

## 2016-10-16 NOTE — Telephone Encounter (Signed)
Pt called requesting Dr. Erlene Quan review CT results again prior to cysto. Pt then stated that it has been 70mo since having micro hematuria and wanted another urine to be completed prior to cysto. Reinforced with pt the purpose of cysto and CT. Also made pt aware an u/a will be performed prior to procedure to ensure no infection is present. Pt voiced understanding.

## 2016-10-17 ENCOUNTER — Encounter: Payer: Self-pay | Admitting: Urology

## 2016-10-17 ENCOUNTER — Ambulatory Visit (INDEPENDENT_AMBULATORY_CARE_PROVIDER_SITE_OTHER): Payer: Medicaid Other | Admitting: Urology

## 2016-10-17 VITALS — BP 130/85 | HR 80 | Ht 66.0 in | Wt 144.0 lb

## 2016-10-17 DIAGNOSIS — R3129 Other microscopic hematuria: Secondary | ICD-10-CM | POA: Diagnosis not present

## 2016-10-17 DIAGNOSIS — F17210 Nicotine dependence, cigarettes, uncomplicated: Secondary | ICD-10-CM

## 2016-10-17 LAB — URINALYSIS, COMPLETE
BILIRUBIN UA: NEGATIVE
Glucose, UA: NEGATIVE
Ketones, UA: NEGATIVE
Leukocytes, UA: NEGATIVE
NITRITE UA: NEGATIVE
PH UA: 5.5 (ref 5.0–7.5)
Protein, UA: NEGATIVE
Specific Gravity, UA: 1.02 (ref 1.005–1.030)
UUROB: 0.2 mg/dL (ref 0.2–1.0)

## 2016-10-17 LAB — MICROSCOPIC EXAMINATION
BACTERIA UA: NONE SEEN
WBC UA: NONE SEEN /HPF (ref 0–?)

## 2016-10-17 MED ORDER — LIDOCAINE HCL 2 % EX GEL
1.0000 "application " | Freq: Once | CUTANEOUS | Status: AC
  Administered 2016-10-17: 1 via URETHRAL

## 2016-10-17 MED ORDER — CIPROFLOXACIN HCL 500 MG PO TABS
500.0000 mg | ORAL_TABLET | Freq: Once | ORAL | Status: AC
  Administered 2016-10-17: 500 mg via ORAL

## 2016-10-18 ENCOUNTER — Encounter: Payer: Medicaid Other | Admitting: Obstetrics and Gynecology

## 2016-10-18 ENCOUNTER — Encounter: Payer: Self-pay | Admitting: *Deleted

## 2016-10-18 NOTE — Progress Notes (Signed)
   10/17/16  CC:  Chief Complaint  Patient presents with  . Cysto    HPI: 55 year old female with microscopic hematuria who returns today for office cystoscopy to complete her workup. Workup has been delayed due to  cancellations in the interim.  She did undergo CT urogram On 07/30/2016 which showed a incidental finding of left sided small webs shaped area of diminished attenuation, possibly infectious versus ischemic.  Incidental findings of significant atherosclerosis was identified.  She continues to have chronic diffuse lower abdominal pain of unclear etiology. She has no urinary complaints today. No gross hematuria.  Blood pressure 130/85, pulse 80, height 5\' 6"  (1.676 m), weight 144 lb (65.3 kg). NED. A&Ox3.  Somewhat agitated. No respiratory distress   Abd soft, NT, ND Normal external genitalia with patent urethral meatus  Cystoscopy Procedure Note  Patient identification was confirmed, informed consent was obtained, and patient was prepped using Betadine solution.  Lidocaine jelly was administered per urethral meatus.    Preoperative abx where received prior to procedure.    Procedure: - Flexible cystoscope introduced, without any difficulty.   - Thorough search of the bladder revealed:    normal urethral meatus    normal urothelium    no stones    no ulcers     no tumors    no urethral polyps    no trabeculation  - Ureteral orifices were normal in position and appearance.  Post-Procedure: - Patient tolerated the procedure well  Assessment/ Plan:  1. Microscopic hematuria S/p negative negative workup including cystoscopy today Incidental wedge shaped area on left kidney infectious vs. Ischemic- given lack of symptoms and small area, no further work up indicated Followed by cardiology- medically optimized other than smoking - Urinalysis, Complete - ciprofloxacin (CIPRO) tablet 500 mg; Take 1 tablet (500 mg total) by mouth once. - lidocaine (XYLOCAINE) 2 %  jelly 1 application; Place 1 application into the urethra once.  2. Cigarette nicotine dependence without complication Encouraged smoking cessation today, not interested in quitting   Follow up as needed   Hollice Espy, MD

## 2016-10-23 ENCOUNTER — Ambulatory Visit (INDEPENDENT_AMBULATORY_CARE_PROVIDER_SITE_OTHER): Payer: Medicaid Other | Admitting: Family Medicine

## 2016-10-23 ENCOUNTER — Encounter: Payer: Self-pay | Admitting: Family Medicine

## 2016-10-23 ENCOUNTER — Ambulatory Visit (INDEPENDENT_AMBULATORY_CARE_PROVIDER_SITE_OTHER): Payer: Medicaid Other | Admitting: General Surgery

## 2016-10-23 VITALS — BP 130/74 | HR 74 | Resp 12 | Ht 66.0 in | Wt 146.0 lb

## 2016-10-23 VITALS — BP 132/79 | HR 107 | Temp 99.1°F | Resp 18 | Ht 66.0 in | Wt 141.8 lb

## 2016-10-23 DIAGNOSIS — D1739 Benign lipomatous neoplasm of skin and subcutaneous tissue of other sites: Secondary | ICD-10-CM | POA: Diagnosis not present

## 2016-10-23 DIAGNOSIS — K5900 Constipation, unspecified: Secondary | ICD-10-CM

## 2016-10-23 DIAGNOSIS — D171 Benign lipomatous neoplasm of skin and subcutaneous tissue of trunk: Secondary | ICD-10-CM | POA: Insufficient documentation

## 2016-10-23 DIAGNOSIS — E559 Vitamin D deficiency, unspecified: Secondary | ICD-10-CM

## 2016-10-23 MED ORDER — LINACLOTIDE 290 MCG PO CAPS: 290.0000 ug | ORAL_CAPSULE | Freq: Every day | ORAL | 0 refills | Status: DC

## 2016-10-23 NOTE — Progress Notes (Signed)
Patient ID: Patricia Rollins, female   DOB: [birth year], 55 y.o.   MRN: [id number]  Chief Complaint  Patient presents with  . Other    chest wall mass    HPI Patricia Rollins is a 54 y.o. female here today for a evaluation of chest wall mass. Patient had a ct scan done on 08/28/2016. She states the mass has been there for seven years now, in the last two year the area has grown. No pain but some soreness along the band of her bra/ HPI  Past Medical History:  Diagnosis Date  . Anxiety   . Anxiety   . Arthritis    patient has bilateral bursitis in hips  . Depression   . Fibromyalgia   . Hematuria 06/21/2016  . Neuromuscular disorder [facility])     Past Surgical History:  Procedure Laterality Date  . ABDOMINAL HYSTERECTOMY    . APPENDECTOMY    . OOPHORECTOMY    . TONSILLECTOMY AND ADENOIDECTOMY Bilateral     Family History  Problem Relation Age of Onset  . Ovarian cancer Mother   . Pancreatic cancer Mother   . Bladder Cancer Neg Hx   . Kidney cancer Neg Hx   . Prostate cancer Neg Hx     Social History Social History  Substance Use Topics  . Smoking status: Current Every Day Smoker    Packs/day: 1.00    Types: Cigarettes  . Smokeless tobacco: Never Used  . Alcohol use No    Allergies  Allergen Reactions  . Sulfa Antibiotics Nausea Only  . Prozac  [Fluoxetine]     Current Outpatient Prescriptions  Medication Sig Dispense Refill  . ALPRAZolam (XANAX) 1 MG tablet Take 1 tablet (1 mg total) by mouth 4 (four) times daily. 84 tablet 0  . aspirin 325 MG tablet Take 325 mg by mouth daily.    Marland Kitchen HYDROcodone-acetaminophen (NORCO) 7.5-325 MG tablet Take 1 tablet by mouth every 8 (eight) hours as needed for moderate pain. 90 tablet 0  . lovastatin (MEVACOR) 20 MG tablet Take 1 tablet (20 mg total) by mouth at bedtime. 30 tablet 6  . tiZANidine (ZANAFLEX) 4 MG tablet TAKE ONE TABLET EVERY 8 HOURS AS NEEDED 90 tablet 2  . Vitamin D, Ergocalciferol, (DRISDOL) 50000 units CAPS capsule  Take 1 capsule (50,000 Units total) by mouth once a week. For 12 weeks 12 capsule 0  . linaclotide (LINZESS) 290 MCG CAPS capsule Take 1 capsule (290 mcg total) by mouth daily before breakfast. 30 capsule 0   No current facility-administered medications for this visit.     Review of Systems Review of Systems  Constitutional: Negative.   Respiratory: Negative.   Cardiovascular: Negative.     Blood pressure 130/74, pulse 74, resp. rate 12, height 5\' 6"  (1.676 m), weight 146 lb (66.2 kg).  Physical Exam Physical Exam  Constitutional: She is oriented to person, place, and time. She appears well-developed and well-nourished.  Eyes: Conjunctivae are normal. No scleral icterus.  Neck: Neck supple.  Cardiovascular: Normal rate, regular rhythm and normal heart sounds.   Pulmonary/Chest: Effort normal and breath sounds normal.  Abdominal: Soft. Bowel sounds are normal. There is no tenderness.    Lymphadenopathy:    She has no cervical adenopathy.  Neurological: She is alert and oriented to person, place, and time.  Skin: Skin is warm and dry.    Data Reviewed Chest CT dated 08/28/2016 was reviewed. Faint asymmetry in the area of known palpable mass suggestive of  a lipoma was seen. No extension or involvement of the underlying musculature.  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    Plan    Excision under local anesthesia should be well tolerated.   Patient to return for excision left lipoma removal.   This information has been scribed by Gaspar Cola CMA.   Robert Bellow 10/23/2016, 7:45 PM

## 2016-10-23 NOTE — Patient Instructions (Signed)
Patient to return for excision left lipoma removal.

## 2016-10-23 NOTE — Progress Notes (Signed)
Name: Patricia Rollins   MRN: [id number]    DOB: 06-Nov-1961   Date:10/23/2016       Progress Note  Subjective  Chief Complaint  Chief Complaint  Patient presents with  . Referral    stomach pain and constipation x3 weeks    Constipation  This is a new problem. The current episode started 1 to 4 weeks ago (3 weeks ago, started after she had diarrhea and nuasea after taking the bowel prepr in preparation for colonoscopy.). The problem is unchanged. Her stool frequency is 1 time per week or less (pt. had stool 3 days ago). The stool is described as loose. There has been adequate water intake. Associated symptoms include abdominal pain, bloating, diarrhea and flatus. Pertinent negatives include no difficulty urinating, fecal incontinence, fever, nausea or vomiting (1 episode of vomiting after taking the bowel prep). She has tried fiber, laxatives and stool softeners (Miralax, womens laxatives, enema) for the symptoms. The treatment provided moderate relief. Her past medical history is significant for psychiatric history. There is no history of abdominal surgery or irritable bowel syndrome (has ben diagnosed with IBS).    Past Medical History:  Diagnosis Date  . Anxiety   . Anxiety   . Arthritis    patient has bilateral bursitis in hips  . Depression   . Fibromyalgia   . Hematuria 06/21/2016  . Neuromuscular disorder [facility])     Past Surgical History:  Procedure Laterality Date  . ABDOMINAL HYSTERECTOMY    . APPENDECTOMY    . OOPHORECTOMY    . TONSILLECTOMY AND ADENOIDECTOMY Bilateral     Family History  Problem Relation Age of Onset  . Ovarian cancer Mother   . Pancreatic cancer Mother   . Bladder Cancer Neg Hx   . Kidney cancer Neg Hx   . Prostate cancer Neg Hx     Social History   Social History  . Marital status: Single    Spouse name: N/A  . Number of children: N/A  . Years of education: N/A   Occupational History  . Not on file.   Social History Main Topics  .  Smoking status: Current Every Day Smoker    Packs/day: 1.00    Types: Cigarettes  . Smokeless tobacco: Never Used  . Alcohol use No  . Drug use: No  . Sexual activity: No   Other Topics Concern  . Not on file   Social History Narrative  . No narrative on file     Current Outpatient Prescriptions:  .  ALPRAZolam (XANAX) 1 MG tablet, Take 1 tablet (1 mg total) by mouth 4 (four) times daily., Disp: 84 tablet, Rfl: 0 .  aspirin 325 MG tablet, Take 325 mg by mouth daily., Disp: , Rfl:  .  HYDROcodone-acetaminophen (NORCO) 7.5-325 MG tablet, Take 1 tablet by mouth every 8 (eight) hours as needed for moderate pain., Disp: 90 tablet, Rfl: 0 .  lovastatin (MEVACOR) 20 MG tablet, Take 1 tablet (20 mg total) by mouth at bedtime., Disp: 30 tablet, Rfl: 6 .  tiZANidine (ZANAFLEX) 4 MG tablet, TAKE ONE TABLET EVERY 8 HOURS AS NEEDED, Disp: 90 tablet, Rfl: 2 .  Vitamin D, Ergocalciferol, (DRISDOL) 50000 units CAPS capsule, Take 1 capsule (50,000 Units total) by mouth once a week. For 12 weeks, Disp: 12 capsule, Rfl: 0  Allergies  Allergen Reactions  . Sulfa Antibiotics Nausea Only  . Prozac  [Fluoxetine]      Review of Systems  Constitutional: Negative for fever.  Gastrointestinal: Positive for abdominal pain, bloating, constipation, diarrhea and flatus. Negative for nausea and vomiting (1 episode of vomiting after taking the bowel prep).  Genitourinary: Negative for difficulty urinating.    Objective  Vitals:   10/23/16 1438  BP: 132/79  Pulse: (!) 107  Resp: 18  Temp: 99.1 F (37.3 C)  TempSrc: Oral  SpO2: 96%  Weight: 141 lb 12.8 oz (64.3 kg)  Height: 5\' 6"  (1.676 m)    Physical Exam  Constitutional: She is oriented to person, place, and time and well-developed, well-nourished, and in no distress.  HENT:  Head: Normocephalic and atraumatic.  Cardiovascular: Normal rate, regular rhythm and normal heart sounds.   No murmur heard. Pulmonary/Chest: Effort normal and breath  sounds normal. She has no wheezes.  Abdominal: Soft. Bowel sounds are hypoactive. There is tenderness.  Neurological: She is alert and oriented to person, place, and time.  Nursing note and vitals reviewed.      Assessment & Plan  1. Acute constipation Unclear etiology, patient believes it started after she started taking Premarin. Other differential includes symptoms of IBS with constipation versus opioid-induced constipation. We'll refer to gastroenterology and started on a trial of lens S. - Ambulatory referral to Gastroenterology - DG Abd 2 Views; Future - linaclotide (LINZESS) 290 MCG CAPS capsule; Take 1 capsule (290 mcg total) by mouth daily before breakfast.  Dispense: 30 capsule; Refill: 0  2. Vitamin D deficiency  - Vitamin D (25 hydroxy)  Leonette Tischer [facility] 10/23/2016 2:46 PM

## 2016-10-24 LAB — VITAMIN D 25 HYDROXY (VIT D DEFICIENCY, FRACTURES): VIT D 25 HYDROXY: 38 ng/mL (ref 30–100)

## 2016-10-30 ENCOUNTER — Encounter: Payer: Self-pay | Admitting: Gastroenterology

## 2016-10-30 ENCOUNTER — Ambulatory Visit
Admission: RE | Admit: 2016-10-30 | Discharge: 2016-10-30 | Disposition: A | Discharge status: Home / Self Care | Payer: Medicaid Other | Source: Ambulatory Visit | Attending: Family Medicine | Admitting: Family Medicine

## 2016-10-30 ENCOUNTER — Ambulatory Visit (INDEPENDENT_AMBULATORY_CARE_PROVIDER_SITE_OTHER): Payer: Medicaid Other | Admitting: Gastroenterology

## 2016-10-30 VITALS — BP 143/88 | HR 91 | Temp 98.9°F | Ht 66.0 in | Wt 140.8 lb

## 2016-10-30 DIAGNOSIS — R140 Abdominal distension (gaseous): Secondary | ICD-10-CM | POA: Diagnosis not present

## 2016-10-30 DIAGNOSIS — K5900 Constipation, unspecified: Secondary | ICD-10-CM

## 2016-10-30 DIAGNOSIS — F17210 Nicotine dependence, cigarettes, uncomplicated: Secondary | ICD-10-CM | POA: Diagnosis not present

## 2016-10-30 DIAGNOSIS — K5909 Other constipation: Secondary | ICD-10-CM | POA: Diagnosis not present

## 2016-10-30 NOTE — Progress Notes (Signed)
Gastroenterology Consultation  Referring Provider:     Roselee Nova, MD Primary Care Physician:  Keith Rake, MD Primary Gastroenterologist:  Dr. Jonathon Bellows  Reason for Consultation:     Constipation         HPI:   Patricia Rollins is a 55 y.o. y/o female referred for consultation & management  by Dr. Keith Rake, MD.     She has been referred for constipation by Dr Manuella Ghazi. She has had a colonoscopy previously scheduled which she cancelled as she was sick . She was seen by Dr Manuella Ghazi recently and commenced on Linzess 290 mcg  Constipation: Onset On going on and off for several months , worse since end of November  Frequency of bowel movements has a bowel movement only with a laxative , which she takes once every few times  Consistency , Extremely loose , worse since she started vitamin D  Shape no new  change Pain cramping  Bloating/abdominal distension yes  Bleeding no  Last colonoscopy never had one  Weight loss no  Family history of colon cancer no  Diet two fruits a day , no vegetables, not much grain .  Narcotic/anticholinergic medications Hydrocodone for a few years, for fibromyalgia  Laxative use "womens laxative"  Thyroid abnormalities not known.   X ray shows a lot of stool in the right side of her colon.   She is convinced it is the vitamin D is the cause of her constipation , she is concerned that her premarin is causing her constipation .   Past Medical History:  Diagnosis Date  . Anxiety   . Anxiety   . Arthritis    patient has bilateral bursitis in hips  . Depression   . Fibromyalgia   . Hematuria 06/21/2016  . Neuromuscular disorder [facility])     Past Surgical History:  Procedure Laterality Date  . ABDOMINAL HYSTERECTOMY    . APPENDECTOMY    . OOPHORECTOMY    . TONSILLECTOMY AND ADENOIDECTOMY Bilateral     Prior to Admission medications   Medication Sig Start Date End Date Taking? Authorizing Provider  ALPRAZolam Duanne Moron) 1 MG tablet Take 1 tablet (1 mg  total) by mouth 4 (four) times daily. 06/21/16  Yes Roselee Nova, MD  aspirin 325 MG tablet Take 325 mg by mouth daily.   Yes Historical Provider, MD  HYDROcodone-acetaminophen (NORCO) 7.5-325 MG tablet Take 1 tablet by mouth every 8 (eight) hours as needed for moderate pain. 08/27/16  Yes Roselee Nova, MD  lovastatin (MEVACOR) 20 MG tablet Take 1 tablet (20 mg total) by mouth at bedtime. 10/12/16  Yes Minna Merritts, MD  tiZANidine (ZANAFLEX) 4 MG tablet TAKE ONE TABLET EVERY 8 HOURS AS NEEDED 10/03/16  Yes Roselee Nova, MD  Vitamin D, Ergocalciferol, (DRISDOL) 50000 units CAPS capsule Take 1 capsule (50,000 Units total) by mouth once a week. For 12 weeks 07/26/16  Yes Roselee Nova, MD  linaclotide Rolan Lipa) 290 MCG CAPS capsule Take 1 capsule (290 mcg total) by mouth daily before breakfast. Patient not taking: Reported on 10/30/2016 10/23/16   Roselee Nova, MD    Family History  Problem Relation Age of Onset  . Ovarian cancer Mother   . Pancreatic cancer Mother   . Bladder Cancer Neg Hx   . Kidney cancer Neg Hx   . Prostate cancer Neg Hx      Social History  Substance Use Topics  .  Smoking status: Current Every Day Smoker    Packs/day: 1.00    Types: Cigarettes  . Smokeless tobacco: Never Used  . Alcohol use No    Allergies as of 10/30/2016 - Review Complete 10/30/2016  Allergen Reaction Noted  . Prozac [fluoxetine] Other (See Comments) 07/17/2015  . Sulfa antibiotics Nausea Only 05/12/2015    Review of Systems:    All systems reviewed and negative except where noted in HPI.   Physical Exam:  BP (!) 143/88   Pulse 91   Temp 98.9 F (37.2 C) (Oral)   Ht 5\' 6"  (1.676 m)   Wt 140 lb 12.8 oz (63.9 kg)   BMI 22.73 kg/m  No LMP recorded. Patient has had a hysterectomy. Psych:  Alert and cooperative. Normal mood and affect. General:   Alert,  Well-developed, well-nourished, pleasant and cooperative in NAD Head:  Normocephalic and atraumatic. Eyes:   Sclera clear, no icterus.   Conjunctiva pink. Ears:  Normal auditory acuity. Nose:  No deformity, discharge, or lesions. Mouth:  No deformity or lesions,oropharynx pink & moist. Neck:  Supple; no masses or thyromegaly. Lungs:  Respirations even and unlabored.  Clear throughout to auscultation.   No wheezes, crackles, or rhonchi. No acute distress. Heart:  Regular rate and rhythm; no murmurs, clicks, rubs, or gallops. Abdomen:  Normal bowel sounds.  No bruits.  Soft, non-tender and non-distended without masses, hepatosplenomegaly or hernias noted.  No guarding or rebound tenderness.    Psych:  Alert and cooperative. Normal mood and affect.  Imaging Studies: No results found.  Assessment and Plan:   Patricia Rollins is a 55 y.o. y/o female has been referred for constipation . During the entire visit she was very fixated about a taste in her mouth , other non specific symptoms. I tried to stress on the importance of having a colonoscopy . She was not keen. I did mention that I would like to rule out any underlying neoplasm. She has not yet started the linzess prescribed by Dr Manuella Ghazi, strongly suggested her to commence on it tomorrow.    1. Strongly suggested  A colonoscopy due to new onset worsening of constipation , she is not keen  2. Diet very low in  Fiber, suggest 25 grams of dietary fiber a day  3. Return in a few weeks and I will emphasize the need for a colonoscopy .  4. I suspect the hydrocodone is also contributing to her constipation but she feels it helps with her bowel movements.  5. Stong[place] advised her to quit smoking as it can be contributing to her bloating which she says bothers her.   Follow up in 2-3 weeks  Dr Jonathon Bellows MD

## 2016-10-30 NOTE — Patient Instructions (Signed)
High-Fiber Diet Fiber, also called dietary fiber, is a type of carbohydrate found in fruits, vegetables, whole grains, and beans. A high-fiber diet can have many health benefits. Your health care provider may recommend a high-fiber diet to help:  Prevent constipation. Fiber can make your bowel movements more regular.  Lower your cholesterol.  Relieve hemorrhoids, uncomplicated diverticulosis, or irritable bowel syndrome.  Prevent overeating as part of a weight-loss plan.  Prevent heart disease, type 2 diabetes, and certain cancers. What is my plan? The recommended daily intake of fiber includes:  38 grams for men under age 66.  42 grams for men over age 84.  36 grams for women under age 46.  15 grams for women over age 15. You can get the recommended daily intake of dietary fiber by eating a variety of fruits, vegetables, grains, and beans. Your health care provider may also recommend a fiber supplement if it is not possible to get enough fiber through your diet. What do I need to know about a high-fiber diet?  Fiber supplements have not been widely studied for their effectiveness, so it is better to get fiber through food sources.  Always check the fiber content on thenutrition facts label of any prepackaged food. Look for foods that contain at least 5 grams of fiber per serving.  Ask your dietitian if you have questions about specific foods that are related to your condition, especially if those foods are not listed in the following section.  Increase your daily fiber consumption gradually. Increasing your intake of dietary fiber too quickly may cause bloating, cramping, or gas.  Drink plenty of water. Water helps you to digest fiber. What foods can I eat? Grains  Whole-grain breads. Multigrain cereal. Oats and oatmeal. Brown rice. Barley. Bulgur wheat. [place]. Bran muffins. Popcorn. Rye wafer crackers. Vegetables  Sweet potatoes. Spinach. Kale. Artichokes. Cabbage. Broccoli.  Green peas. Carrots. Squash. Fruits  Berries. Pears. Apples. Oranges. Avocados. Prunes and raisins. Dried figs. Meats and Other Protein Sources  [place], kidney, pinto, and soy beans. Split peas. Lentils. Nuts and seeds. Dairy  Fiber-fortified yogurt. Beverages  Fiber-fortified soy milk. Fiber-fortified orange juice. Other  Fiber bars. The items listed above may not be a complete list of recommended foods or beverages. Contact your dietitian for more options.  What foods are not recommended? Grains  White bread. Pasta made with refined flour. White rice. Vegetables  Fried potatoes. Canned vegetables. Well-cooked vegetables. Fruits  Fruit juice. Cooked, strained fruit. Meats and Other Protein Sources  Fatty cuts of meat. Fried Sales executive or fried fish. Dairy  Milk. Yogurt. Cream cheese. Sour cream. Beverages  Soft drinks. Other  Cakes and pastries. Butter and oils. The items listed above may not be a complete list of foods and beverages to avoid. Contact your dietitian for more information.  What are some tips for including high-fiber foods in my diet?  Eat a wide variety of high-fiber foods.  Make sure that half of all grains consumed each day are whole grains.  Replace breads and cereals made from refined flour or white flour with whole-grain breads and cereals.  Replace white rice with brown rice, bulgur wheat, or millet.  Start the day with a breakfast that is high in fiber, such as a cereal that contains at least 5 grams of fiber per serving.  Use beans in place of meat in soups, salads, or pasta.  Eat high-fiber snacks, such as berries, raw vegetables, nuts, or popcorn. This information is not intended to replace  advice given to you by your health care provider. Make sure you discuss any questions you have with your health care provider. Document Released: 10/29/2005 Document Revised: 04/05/2016 Document Reviewed: 04/13/2014 Elsevier Interactive Patient Education  2017  Elsevier Inc.  Constipation, Adult Constipation is when a person:  Poops (has a bowel movement) fewer times in a week than normal.  Has a hard time pooping.  Has poop that is dry, hard, or bigger than normal. Follow these instructions at home: Eating and drinking  Eat foods that have a lot of fiber, such as:  Fresh fruits and vegetables.  Whole grains.  Beans.  Eat less of foods that are high in fat, low in fiber, or overly processed, such as:  [place] fries.  Hamburgers.  Cookies.  Candy.  Soda.  Drink enough fluid to keep your pee (urine) clear or pale yellow. General instructions  Exercise regularly or as told by your doctor.  Go to the restroom when you feel like you need to poop. Do not hold it in.  Take over-the-counter and prescription medicines only as told by your doctor. These include any fiber supplements.  Do pelvic floor retraining exercises, such as:  Doing deep breathing while relaxing your lower belly (abdomen).  Relaxing your pelvic floor while pooping.  Watch your condition for any changes.  Keep all follow-up visits as told by your doctor. This is important. Contact a doctor if:  You have pain that gets worse.  You have a fever.  You have not pooped for 4 days.  You throw up (vomit).  You are not hungry.  You lose weight.  You are bleeding from the anus.  You have thin, pencil-like poop (stool). Get help right away if:  You have a fever, and your symptoms suddenly get worse.  You leak poop or have blood in your poop.  Your belly feels hard or bigger than normal (is bloated).  You have very bad belly pain.  You feel dizzy or you faint. This information is not intended to replace advice given to you by your health care provider. Make sure you discuss any questions you have with your health care provider. Document Released: 04/16/2008 Document Revised: 05/18/2016 Document Reviewed: 04/18/2016 Elsevier Interactive Patient  Education  2017 [employer].

## 2016-10-31 ENCOUNTER — Ambulatory Visit: Payer: Self-pay | Admitting: Gastroenterology

## 2016-10-31 ENCOUNTER — Telehealth: Payer: Self-pay

## 2016-10-31 NOTE — Telephone Encounter (Signed)
Patient stated that she went to see Dr. Vicente Males on yesterday and that he did nothing to help her stomach. She said that he told her that he does not handle the stomach that she should see Dr. Manuella Ghazi. I reassured her that Dr. Vicente Males is in fact the Gastroenterologist and that he would be the one to help her with any digestive issues. She stated that she is still having issues and need some answers since its been going on for so long. I informed her that Dr. Manuella Ghazi is out of the office this week but that I will send him a message and that she could either call Dr. Georgeann Oppenheim office to f/u with them or go to the ER.

## 2016-11-01 ENCOUNTER — Telehealth: Payer: Self-pay | Admitting: Gastroenterology

## 2016-11-01 NOTE — Telephone Encounter (Signed)
Linzess was prescribed by Dr.Shah but he is on vacation. She feels this is giving her a migraine. She is also sick to her stomach and has been for months. She is a patient of Dr. Vicente Males

## 2016-11-06 NOTE — Telephone Encounter (Signed)
LVM for pt to return my call.

## 2016-11-07 NOTE — Telephone Encounter (Signed)
Agree with above, patient was referred to gastroenterology for constipation and she should follow-up with the specialist to address her concerns.

## 2016-11-13 ENCOUNTER — Ambulatory Visit: Payer: Medicaid Other | Admitting: Family Medicine

## 2016-11-14 ENCOUNTER — Telehealth: Payer: Self-pay | Admitting: Cardiovascular Disease

## 2016-11-14 NOTE — Telephone Encounter (Signed)
S/w patient. She stated she was frustrated because she didn't have the results of her abdominal xray. She says she keeps calling and no one is telling her anything. I asked if she had called Dr Manuella Ghazi today or yesterday and she said she had not. Last telephone message with Dr Manuella Ghazi and patient is from 11/07/16. Advised patient to call Dr Trena Platt office today for results. Patient has appt with Dr Manuella Ghazi on 11/19/16.

## 2016-11-14 NOTE — Telephone Encounter (Signed)
Pt states she had a "stomach xray" on the 19th, and pt would like to know if Dr.Gollan can look at it. STates her PCP is out of the office and she is the one who has ordered it.

## 2016-11-15 ENCOUNTER — Telehealth: Payer: Self-pay

## 2016-11-15 ENCOUNTER — Telehealth: Payer: Self-pay | Admitting: Family Medicine

## 2016-11-15 NOTE — Telephone Encounter (Signed)
Patient called last week because there is a burning on her stomach, throat and tongue. Patient said her call was returned and she was told that she needed another colonoscopy. The patient is wanting you to call something into the pharmacy for her stomach since it's hurting really bad. Please call patient and advice.

## 2016-11-15 NOTE — Telephone Encounter (Signed)
Patient has questions regarding her abdomen x-ray results from Dec. 2017.  Patient stated that she is still having symptoms. Please call patient.

## 2016-11-15 NOTE — Telephone Encounter (Signed)
Spoke with pt regarding her symptoms. Advised her you wanted to schedule a colonoscopy and EGD to see whats going on but this was cancelled due to her being sick. Pt stated she is having burning, reflux and nausea. She stated she can't schedule this procedure and drink the prep solution until her stomach settles down enough to drink it. Please advise if there is something I can call into the pharmacy for her. See message below as well.

## 2016-11-16 ENCOUNTER — Other Ambulatory Visit: Payer: Self-pay

## 2016-11-16 MED ORDER — SUCRALFATE 1 G PO TABS: 1.0000 g | ORAL_TABLET | Freq: Three times a day (TID) | ORAL | 2 refills | Status: DC

## 2016-11-16 MED ORDER — OMEPRAZOLE 20 MG PO CPDR: 20.0000 mg | DELAYED_RELEASE_CAPSULE | Freq: Every day | ORAL | 2 refills | Status: DC

## 2016-11-16 NOTE — Telephone Encounter (Signed)
1. Stop smoking- will help prevent bloating,aerophagia and may be easier to do the prep  2. Carafate 1 tablet TID 3. Daily PPI  4. Due to worsening of constipation strongly suggest proceeding with colonoscopy as she is at higher risk for colon cancer

## 2016-11-16 NOTE — Telephone Encounter (Signed)
Patient has questions about abdominal x-ray, it showed no free air, showed stool in the colon. Patient still has constipation although it is improved but she feels that her stool caliber is very small, she states "it looks like a worm" I have recommended that she should contact the gastroenterologist to reschedule her colonoscopy. She also has burning in the stomach for which she takes OTC Zantac 75 mg and is wondering if I can prescribe something stronger. I have asked her to continue to take Zantac 75 milligrams and schedule an appointment for next week to be considered for a PPI.

## 2016-11-16 NOTE — Telephone Encounter (Signed)
Routed to Dr. Manuella Ghazi for patient advice and return call

## 2016-11-16 NOTE — Telephone Encounter (Signed)
Spoke with pt regarding Dr. Georgeann Oppenheim recommendations. Pt stated I wasn't understanding what she was telling me. She can't drink the bowel prep solution until he stomach gets under control. I advised her the medication Dr. Vicente Males has prescribed will help her stomach. Advised her to try the medication for a couple of weeks and I would follow up with her to see how she is feeling.

## 2016-11-19 ENCOUNTER — Ambulatory Visit: Payer: Medicaid Other | Admitting: Family Medicine

## 2016-11-19 NOTE — Telephone Encounter (Signed)
Patient has appointment today 11/19/2016

## 2016-11-20 ENCOUNTER — Ambulatory Visit: Payer: Self-pay | Admitting: Gastroenterology

## 2016-11-21 ENCOUNTER — Telehealth: Payer: Self-pay

## 2016-11-21 ENCOUNTER — Ambulatory Visit: Payer: Medicaid Other | Admitting: General Surgery

## 2016-11-21 ENCOUNTER — Encounter: Payer: Self-pay | Admitting: Family Medicine

## 2016-11-21 ENCOUNTER — Ambulatory Visit (INDEPENDENT_AMBULATORY_CARE_PROVIDER_SITE_OTHER): Payer: Medicaid Other | Admitting: Family Medicine

## 2016-11-21 DIAGNOSIS — R1013 Epigastric pain: Secondary | ICD-10-CM

## 2016-11-21 MED ORDER — PANTOPRAZOLE SODIUM 20 MG PO TBEC: 20.0000 mg | DELAYED_RELEASE_TABLET | Freq: Every day | ORAL | 0 refills | Status: DC

## 2016-11-21 NOTE — Telephone Encounter (Signed)
Patient called and was concerned about continued blood in her urine and abdominal bloating and discomfort. She said that she had discussed this at her last visit. 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 She is not sure if her other ovary was removed at that time or not. She has concerns that this may be causing her symptoms if still present. She wants to know if an ultrasound would be of help in trying to find the cause of her symptoms. She is also asking for any recommendations for a new primary care doctor for her.

## 2016-11-21 NOTE — Progress Notes (Signed)
Name: Patricia Rollins   MRN: [id number]    DOB: 02/16/1961   Date:11/21/2016       Progress Note  Subjective  Chief Complaint  Chief Complaint  Patient presents with  . Follow-up    1 mo    HPI  Pt. Presents for follow up from last visit. She has symptoms of burning in her mouth, feels bloated, denies any heartburn or reflux symptoms. She has been seen by Huel Coventry who prescribed Sucralfate and Omeprazole for the patient. Omeprazole caused her to have a headache and she is requesting to change the medication to something different.  She is having a regular BM now but still feels bloated especially after she eats, she had a normal abdominal x ray but is requesting an abdominal ultrasound to make sure its not fluid in her abdomen.    Past Medical History:  Diagnosis Date  . Anxiety   . Anxiety   . Arthritis    patient has bilateral bursitis in hips  . Depression   . Fibromyalgia   . Hematuria 06/21/2016  . Neuromuscular disorder [facility])     Past Surgical History:  Procedure Laterality Date  . ABDOMINAL HYSTERECTOMY    . APPENDECTOMY    . OOPHORECTOMY    . TONSILLECTOMY AND ADENOIDECTOMY Bilateral     Family History  Problem Relation Age of Onset  . Ovarian cancer Mother   . Pancreatic cancer Mother   . Bladder Cancer Neg Hx   . Kidney cancer Neg Hx   . Prostate cancer Neg Hx     Social History   Social History  . Marital status: Single    Spouse name: N/A  . Number of children: N/A  . Years of education: N/A   Occupational History  . Not on file.   Social History Main Topics  . Smoking status: Current Every Day Smoker    Packs/day: 1.00    Types: Cigarettes  . Smokeless tobacco: Never Used  . Alcohol use No  . Drug use: No  . Sexual activity: No   Other Topics Concern  . Not on file   Social History Narrative  . No narrative on file     Current Outpatient Prescriptions:  .  ALPRAZolam (XANAX) 1 MG tablet, Take 1 tablet (1 mg total) by mouth 4  (four) times daily., Disp: 84 tablet, Rfl: 0 .  aspirin 325 MG tablet, Take 325 mg by mouth daily., Disp: , Rfl:  .  estrogens, conjugated, (PREMARIN) 0.625 MG tablet, Take 0.625 mg by mouth daily. Take daily for 21 days then do not take for 7 days., Disp: , Rfl:  .  HYDROcodone-acetaminophen (NORCO) 7.5-325 MG tablet, Take 1 tablet by mouth every 8 (eight) hours as needed for moderate pain., Disp: 90 tablet, Rfl: 0 .  lovastatin (MEVACOR) 20 MG tablet, Take 1 tablet (20 mg total) by mouth at bedtime., Disp: 30 tablet, Rfl: 6 .  omeprazole (PRILOSEC) 20 MG capsule, Take 1 capsule (20 mg total) by mouth daily., Disp: 30 capsule, Rfl: 2 .  sucralfate (CARAFATE) 1 g tablet, Take 1 tablet (1 g total) by mouth 4 (four) times daily -  with meals and at bedtime., Disp: 90 tablet, Rfl: 2 .  tiZANidine (ZANAFLEX) 4 MG tablet, TAKE ONE TABLET EVERY 8 HOURS AS NEEDED, Disp: 90 tablet, Rfl: 2 .  linaclotide (LINZESS) 290 MCG CAPS capsule, Take 1 capsule (290 mcg total) by mouth daily before breakfast. (Patient not taking: Reported on 11/21/2016), Disp: 30  capsule, Rfl: 0 .  Vitamin D, Ergocalciferol, (DRISDOL) 50000 units CAPS capsule, Take 1 capsule (50,000 Units total) by mouth once a week. For 12 weeks (Patient not taking: Reported on 11/21/2016), Disp: 12 capsule, Rfl: 0  Allergies  Allergen Reactions  . Prozac [Fluoxetine] Other (See Comments)    Confusion  . Sulfa Antibiotics Nausea Only     Review of Systems  Constitutional: Positive for chills and malaise/fatigue. Negative for fever.  Gastrointestinal: Positive for abdominal pain. Negative for constipation, nausea and vomiting.  Genitourinary: Positive for hematuria.     Objective  Vitals:   11/21/16 1013  BP: 140/63  Pulse: (!) 104  Resp: 16  Temp: 98 F (36.7 C)  TempSrc: Oral  SpO2: 96%  Weight: 144 lb 8 oz (65.5 kg)  Height: 5\' 6"  (1.676 m)    Physical Exam  Constitutional: She is oriented to person, place, and time and  well-developed, well-nourished, and in no distress.  Cardiovascular: Normal rate, regular rhythm and normal heart sounds.   No murmur heard. Pulmonary/Chest: Effort normal and breath sounds normal. She has no wheezes.  Abdominal: Soft. Bowel sounds are normal. There is no tenderness.  Neurological: She is alert and oriented to person, place, and time.  Psychiatric: Mood, memory, affect and judgment normal.  Nursing note and vitals reviewed.       Assessment & Plan  1. Epigastric abdominal pain Reviewed gastroenterology note in detail and reviewed with patient, she is now on sucralfate and asking for replacement of omeprazole because it gives her a headache. Have advised patient to undergo colonoscopy and she'll think about that. For now will switch to pantoprazole 20 mg daily, advised to follow up with gastroenterology. - pantoprazole (PROTONIX) 20 MG tablet; Take 1 tablet (20 mg total) by mouth daily.  Dispense: 30 tablet; Refill: 0   Precilla Purnell [facility] 11/21/2016 10:26 AM

## 2016-11-23 ENCOUNTER — Telehealth: Payer: Self-pay

## 2016-11-23 DIAGNOSIS — R1013 Epigastric pain: Secondary | ICD-10-CM

## 2016-11-23 NOTE — Telephone Encounter (Signed)
Patient was put on a medication a week ago and she was supposed to get a call from [employer] but hasn't heard from her yet. Patient is still having extreme bloating, she states it's worse. Please call patient and advice.

## 2016-11-23 NOTE — Telephone Encounter (Signed)
Patient called stating that she wanted a new referral for a G.I specialist at the [facility], since Dr. Manuella Ghazi will not order a ultrasound for the abdomen.  Please advise

## 2016-11-23 NOTE — Telephone Encounter (Signed)
Spoke with pt today regarding her stomach. She stated she tried to get Dr. Manuella Ghazi to order an [place] to see if there is somethrong wrong but he only ordered a xray. He did recommend for her to schedule her colonoscopy. Again, she stated she cannot do the colonoscopy until she gets her stomach right. I asked her to explain this to me and she stated she dont think she can tolerate drinking the prep. She told me she was having dark stools and every time she takes any medication it blows her stomach up. She asked me if you would order an [place] to see if there is something wrong. I told her it was recommended she have a colonoscopy and I don't think you were order this as we need to do this procedure. She then asked if you could just check her stomach first. Please advise if I can schedule her for an EGD. She then asked me if the medication you gave her would stop the bleeding. I told her the medication that was given to her was to help relieve the discomfort.

## 2016-11-23 NOTE — Telephone Encounter (Signed)
Okay to order referral to a GI specialist in [place].

## 2016-11-26 ENCOUNTER — Other Ambulatory Visit: Payer: Self-pay

## 2016-11-26 ENCOUNTER — Telehealth: Payer: Self-pay

## 2016-11-26 NOTE — Telephone Encounter (Signed)
Spoke with pt regarding her PPI. Pt stated the protonix is giving her a headache. Pt has requested to restart Zantac 150mg  until her procedure on Thursday. Advised pt that will be fine as Dr. Vicente Males wanted her on a PPI.

## 2016-11-26 NOTE — Telephone Encounter (Signed)
Yes we can do an EGD. Check if she is taking a PPI if not commence on omeprazole 40 mg once daily

## 2016-11-26 NOTE — Telephone Encounter (Signed)
Referral was placed via [facility]

## 2016-11-26 NOTE — Telephone Encounter (Signed)
Pt scheduled for an EGD with Vicente Males at [facility] on 11/29/16. Please precert for Nausea and vomiting R11.2

## 2016-11-26 NOTE — Telephone Encounter (Signed)
Patient was put on anti-acids by Dr. Vicente Males and they were giving her a bad headache. Her PCP changed her to a different anti-acid but that is also giving her a headache. Patient wants to know if she can stop her ani-acids. She said before all of this she was taking over the counter Zantac and said it was working just fine. Please call patient and advice.

## 2016-11-27 ENCOUNTER — Ambulatory Visit: Payer: Medicaid Other | Admitting: Family Medicine

## 2016-11-28 ENCOUNTER — Encounter: Payer: Self-pay | Admitting: *Deleted

## 2016-11-29 ENCOUNTER — Ambulatory Visit: Admit: 2016-11-29 | Payer: Medicaid Other | Admitting: Gastroenterology

## 2016-11-29 SURGERY — ESOPHAGOGASTRODUODENOSCOPY (EGD) WITH PROPOFOL
Anesthesia: General

## 2016-12-03 ENCOUNTER — Telehealth: Payer: Self-pay | Admitting: *Deleted

## 2016-12-03 NOTE — Telephone Encounter (Signed)
Patient called back and was advised to go see the GI for abdominal bloating per Dr.Byrnett

## 2016-12-03 NOTE — Telephone Encounter (Signed)
Patient has been evaluated by urology with no etiology for the microscopic hematuria.  Abdominal bloating can be reviewed and assessed with GI evaluation.  No recommendations regarding new PCP.

## 2016-12-05 ENCOUNTER — Other Ambulatory Visit: Payer: Self-pay | Admitting: General Surgery

## 2016-12-05 ENCOUNTER — Ambulatory Visit (INDEPENDENT_AMBULATORY_CARE_PROVIDER_SITE_OTHER): Payer: Medicaid Other | Admitting: General Surgery

## 2016-12-05 ENCOUNTER — Encounter: Payer: Self-pay | Admitting: General Surgery

## 2016-12-05 VITALS — BP 128/74 | HR 96 | Resp 14 | Ht 66.0 in | Wt 141.0 lb

## 2016-12-05 DIAGNOSIS — D171 Benign lipomatous neoplasm of skin and subcutaneous tissue of trunk: Secondary | ICD-10-CM

## 2016-12-05 DIAGNOSIS — D235 Other benign neoplasm of skin of trunk: Secondary | ICD-10-CM

## 2016-12-05 NOTE — Patient Instructions (Addendum)
The patient is aware to call back for any questions or concerns.  Nurse visit in one week.

## 2016-12-05 NOTE — Progress Notes (Signed)
Patient ID: Patricia Rollins, female   DOB: 1961/04/29, 56 y.o.   MRN: [id number]  Chief Complaint  Patient presents with  . Procedure    HPI Patricia Rollins is a 56 y.o. female.  Here today for excision left chest wall mass.  HPI  Past Medical History:  Diagnosis Date  . Anxiety   . Anxiety   . Arthritis    patient has bilateral bursitis in hips  . Depression   . Fibromyalgia   . Hematuria 06/21/2016  . Neuromuscular disorder [facility])     Past Surgical History:  Procedure Laterality Date  . ABDOMINAL HYSTERECTOMY    . APPENDECTOMY    . OOPHORECTOMY    . TONSILLECTOMY AND ADENOIDECTOMY Bilateral     Family History  Problem Relation Age of Onset  . Ovarian cancer Mother   . Pancreatic cancer Mother   . Bladder Cancer Neg Hx   . Kidney cancer Neg Hx   . Prostate cancer Neg Hx     Social History Social History  Substance Use Topics  . Smoking status: Current Every Day Smoker    Packs/day: 1.00    Types: Cigarettes  . Smokeless tobacco: Never Used  . Alcohol use No    Allergies  Allergen Reactions  . Prozac [Fluoxetine] Other (See Comments)    Confusion  . Sulfa Antibiotics Nausea Only    Current Outpatient Prescriptions  Medication Sig Dispense Refill  . ALPRAZolam (XANAX) 1 MG tablet Take 1 tablet (1 mg total) by mouth 4 (four) times daily. 84 tablet 0  . aspirin 325 MG tablet Take 325 mg by mouth daily.    Marland Kitchen estrogens, conjugated, (PREMARIN) 0.625 MG tablet Take 0.625 mg by mouth daily. Take daily for 21 days then do not take for 7 days.    Marland Kitchen HYDROcodone-acetaminophen (NORCO) 7.5-325 MG tablet Take 1 tablet by mouth every 8 (eight) hours as needed for moderate pain. 90 tablet 0  . sucralfate (CARAFATE) 1 g tablet Take 1 tablet (1 g total) by mouth 4 (four) times daily -  with meals and at bedtime. 90 tablet 2  . tiZANidine (ZANAFLEX) 4 MG tablet TAKE ONE TABLET EVERY 8 HOURS AS NEEDED 90 tablet 2   No current facility-administered medications for this visit.      Review of Systems Review of Systems  Constitutional: Negative.   Respiratory: Negative.   Cardiovascular: Negative.     Blood pressure 128/74, pulse 96, resp. rate 14, height 5\' 6"  (1.676 m), weight 141 lb (64 kg).  Physical Exam Physical Exam  Constitutional: She is oriented to person, place, and time. She appears well-developed and well-nourished.  Pulmonary/Chest:    Neurological: She is alert and oriented to person, place, and time.  Skin: Skin is warm and dry.  Psychiatric: Her behavior is normal.       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    Plan    The area was cleansed with ChloraPrep followed by 20 mL of 0.5% Xylocaine with 0.25% Marcaine with 1-200,000 epinephrine. Lower prep was applied to the skin and a transverse incision was made over the mass. Skin was incised sharply. Hemostasis was with 3-0 Vicryl suture ligatures. The multilobulated mass was excised down to but it did not violate the underlying fascia.  The deep tissue was approximated with a running 3-0 Vicryl suture to obliterate dead space. The skin was closed with a running 3-0 Vicryl subcuticular suture. Benzoin, Steri-Strips, Telfa and  Tegaderm dressing was applied.  The patient tolerated the procedure well.  Use of ice intermittently today and tomorrow was recommended to minimize swelling and discomfort.  The patient reports having hydrocodone available for pain at home. We will have a nursing wound check in one week.    This information has been scribed by Karie Fetch RN, BSN,BC.   Robert Bellow 12/06/2016, 2:43 PM

## 2016-12-06 ENCOUNTER — Telehealth: Payer: Self-pay | Admitting: *Deleted

## 2016-12-06 DIAGNOSIS — D171 Benign lipomatous neoplasm of skin and subcutaneous tissue of trunk: Secondary | ICD-10-CM

## 2016-12-06 MED ORDER — TRAMADOL HCL 50 MG PO TABS: 50.0000 mg | ORAL_TABLET | ORAL | 0 refills | Status: DC | PRN

## 2016-12-06 NOTE — Telephone Encounter (Signed)
Patient had lipoma removed on chest wall yesterday 12/05/16 and now is requesting some pain medication, that she is starting to have some pain.

## 2016-12-06 NOTE — Telephone Encounter (Signed)
RX for tramadol 50 mg 1 po q4h prn pain #20 called to [facility]

## 2016-12-10 ENCOUNTER — Telehealth: Payer: Self-pay

## 2016-12-10 NOTE — Telephone Encounter (Signed)
Notified patient as instructed, patient pleased. Discussed follow-up appointments, patient agrees  

## 2016-12-10 NOTE — Telephone Encounter (Signed)
-----   Message from Robert Bellow, MD sent at 12/08/2016  8:40 AM EST ----- Please notify path OK. Should be coming in for wound check next week.  ----- Message ----- From: Interface, [facility] Sent: 12/07/2016   3:56 PM To: Robert Bellow, MD

## 2016-12-13 ENCOUNTER — Telehealth: Payer: Self-pay | Admitting: Obstetrics and Gynecology

## 2016-12-13 ENCOUNTER — Ambulatory Visit (INDEPENDENT_AMBULATORY_CARE_PROVIDER_SITE_OTHER): Payer: Medicaid Other | Admitting: Obstetrics and Gynecology

## 2016-12-13 ENCOUNTER — Encounter: Payer: Self-pay | Admitting: Obstetrics and Gynecology

## 2016-12-13 ENCOUNTER — Ambulatory Visit (INDEPENDENT_AMBULATORY_CARE_PROVIDER_SITE_OTHER): Payer: Medicaid Other | Admitting: *Deleted

## 2016-12-13 VITALS — BP 138/88 | HR 80 | Ht 66.0 in | Wt 143.1 lb

## 2016-12-13 DIAGNOSIS — Z79890 Hormone replacement therapy (postmenopausal): Secondary | ICD-10-CM | POA: Diagnosis not present

## 2016-12-13 DIAGNOSIS — Z720 Tobacco use: Secondary | ICD-10-CM

## 2016-12-13 DIAGNOSIS — R140 Abdominal distension (gaseous): Secondary | ICD-10-CM

## 2016-12-13 DIAGNOSIS — D171 Benign lipomatous neoplasm of skin and subcutaneous tissue of trunk: Secondary | ICD-10-CM

## 2016-12-13 NOTE — Telephone Encounter (Signed)
Returned pts call after speaking with provider. Per provider- it was discussed at length at pts appt with the decision being left up to pt.to make. Pt verbalized understanding.

## 2016-12-13 NOTE — Telephone Encounter (Signed)
PT WANTED TO KNOW IF SHE CAN TAKE THE PREMIRIN WITH THE 5 BLOCKAGES, IS IT SAFE, BECAUSE ALL THE OTHER DOCTORS TELL HER NOT TO, SHE WANTS TO BUT SHE WANTS TO KNOW IF ITS SAFE. SHE WOULD LIKE A CALL BACK.

## 2016-12-13 NOTE — Progress Notes (Signed)
Patient came in today for a wound check.  The wound is clean, with no signs of infection noted. .Follow up as scheduled.  

## 2016-12-13 NOTE — Progress Notes (Signed)
HPI:      [name] is a 56 y.o. [phone] who LMP was No LMP recorded. Patient has had a hysterectomy.  Subjective: She presents today with multiple issues to discuss. Her main complaint is that ever since she began taking ERT she has experienced abdominal bloating. She is happy on ERT and wishes to continue because she says that "it makes her feel better." She tried discontinuation of the ERT and said that she felt worse and the bloating got worse so she went back on it. A secondary issue is that she would like to know my opinion regarding ERT. A third issue is pain near her umbilicus and she is concerned she may have a hernia. A fourth issue is that she wishes to have an upper endoscopy for her stomach and has seen a GI doctor who has delayed her endoscopy for 5 months. A fifth issue is recent hematuria with complete urinary tract workup with a urologist.     Hx: The following portions of the patient's history were reviewed and updated as appropriate:           She  has a past medical history of Anxiety; Anxiety; Arthritis; Depression; Fibromyalgia; Hematuria (06/21/2016); and Neuromuscular disorder ([place]). She  does not have any pertinent problems on file. She  has a past surgical history that includes Abdominal hysterectomy; Tonsillectomy and adenoidectomy (Bilateral); Oophorectomy; and Appendectomy. She  reports that she has been smoking Cigarettes.  She has been smoking about 1.00 pack per day. She has never used smokeless tobacco. She reports that she does not drink alcohol or use drugs.        ROS: Constitutional: Denied constitutional symptoms, night sweats, recent illness, fatigue, fever, insomnia and weight loss.  Eyes: Denied eye symptoms, eye pain, photophobia, vision change and visual disturbance.  Ears/Nose/Throat/Neck: Denied ear, nose, throat or neck symptoms, hearing loss, nasal discharge, sinus congestion and sore throat.  Cardiovascular: Denied cardiovascular symptoms,  arrhythmia, chest pain/pressure, edema, exercise intolerance, orthopnea and palpitations.  Respiratory: Denied pulmonary symptoms, asthma, pleuritic pain, productive sputum, cough, dyspnea and wheezing.  Gastrointestinal: Denied, gastro-esophageal reflux, melena, nausea and vomiting.  Genitourinary: Abdominal/pelvic bloating.  Musculoskeletal: Denied musculoskeletal symptoms, stiffness, swelling, muscle weakness and myalgia.  Dermatologic: Denied dermatology symptoms, rash and scar.  Neurologic: Denied neurology symptoms, dizziness, headache, neck pain and syncope.  Psychiatric: Denied psychiatric symptoms, anxiety and depression.  Endocrine: Denied endocrine symptoms including hot flashes and night sweats.   Meds: She has a current medication list which includes the following prescription(s): alprazolam, aspirin, estrogens (conjugated), sucralfate, tizanidine, and hydrocodone-acetaminophen.  Objective: Vitals:   12/13/16 1516  BP: 138/88  Pulse: 80            Recent CT results reviewed in detail with her.  Assessment: 1. Tobacco abuse   2. Abdominal bloating   3. Postmenopausal HRT (hormone replacement therapy)     Plan:            1.  I have ordered a GYN pelvic ultrasound to explore possibilities for her pelvic bloating. She believes she may have retained a left ovary after her previous pelvic surgery. Its presence is unknown. Her recent CT showed a negative pelvis, but she states her bleeding became after that was performed.  I will contact her with the ultrasound results.   2.  We have discussed HRT in detail. The risks associated with HRT and her vascular disease were discussed. She has not reached a decision regarding continuation or  discontinuation. I do not believe that her bloating secondary to ERT.   3.  Urology and GI workup deferred to appropriate specialists.   4.  Advised patient to discontinue tobacco use we discussed the risks of tobacco abuse.  5.  We have discussed  no hernia and the criteria for surgery. She will see a general surgeon if she desires definitive care.  Orders Orders Placed This Encounter  Procedures  . US Transvaginal Non-OB    No orders of the defined types were placed in this encounter.       F/U  Return for We will contact her with any abnormal test results.   I spent 27 minutes with this patient of which greater than 50% was spent discussing ERT, pelvic bloating, CT results, urology and GI workup, tobacco use, etc.     Finis Bud, M.D. 12/13/2016 4:34 PM

## 2016-12-13 NOTE — Progress Notes (Signed)
Pt is here with c/o "swelling in my belly". Has history of endometriosis and had a complete hysterectomy. Is using Premarin since Oct 2017. States she hasn't felt well.

## 2016-12-17 ENCOUNTER — Telehealth: Payer: Self-pay | Admitting: Family Medicine

## 2016-12-17 NOTE — Telephone Encounter (Signed)
Pt went to referral in [place] (dr Lenna Sciara rich). Dr Denice Paradise ordered some test and would like to know where you want her to get her pelvic ultrasound complete and nm hepatobiliary with C ck. Would like to know if she could do it anywhere other than [facility].

## 2016-12-18 ENCOUNTER — Other Ambulatory Visit: Payer: Medicaid Other

## 2016-12-20 NOTE — Telephone Encounter (Signed)
Please advise patient with appointments

## 2016-12-21 NOTE — Telephone Encounter (Signed)
Patient will discuss this during her visit. She was scheduled to see Dr. Manuella Ghazi on Monday but has to be rescheduled.

## 2016-12-24 ENCOUNTER — Ambulatory Visit: Payer: Medicaid Other | Admitting: Family Medicine

## 2016-12-25 ENCOUNTER — Encounter: Payer: Self-pay | Admitting: Family Medicine

## 2016-12-25 ENCOUNTER — Ambulatory Visit (INDEPENDENT_AMBULATORY_CARE_PROVIDER_SITE_OTHER): Payer: Medicaid Other | Admitting: Family Medicine

## 2016-12-25 VITALS — BP 120/70 | HR 64 | Temp 98.8°F | Resp 16 | Ht 66.0 in | Wt 146.0 lb

## 2016-12-25 DIAGNOSIS — M48061 Spinal stenosis, lumbar region without neurogenic claudication: Secondary | ICD-10-CM

## 2016-12-25 DIAGNOSIS — Z1159 Encounter for screening for other viral diseases: Secondary | ICD-10-CM

## 2016-12-25 MED ORDER — METAXALONE 800 MG PO TABS: 800.0000 mg | ORAL_TABLET | Freq: Every evening | ORAL | 0 refills | Status: DC | PRN

## 2016-12-25 NOTE — Progress Notes (Signed)
Name: Patricia Rollins   MRN: [id number]    DOB: 04/09/1961   Date:12/25/2016       Progress Note  Subjective  Chief Complaint  Chief Complaint  Patient presents with  . Back Pain    follow up [facility] ER  . Follow-up    HPI  Patient presents for follow up from [facility] ER. SHe was seen for acute worsening of chronic low back pain felt that her back locked up, was in bed for two days, and was diagnosed as having spinal stenosis of lumbar spine. She was prescribed Norco and Toradol for pain relief. She feels better although her back still feels 'locked up.' She feels the weather also makes it worse.  Hurts to sit, stand or lay down.    Past Medical History:  Diagnosis Date  . Anxiety   . Anxiety   . Arthritis    patient has bilateral bursitis in hips  . Depression   . Fibromyalgia   . Hematuria 06/21/2016  . Neuromuscular disorder [facility])     Past Surgical History:  Procedure Laterality Date  . ABDOMINAL HYSTERECTOMY    . APPENDECTOMY    . OOPHORECTOMY    . TONSILLECTOMY AND ADENOIDECTOMY Bilateral     Family History  Problem Relation Age of Onset  . Ovarian cancer Mother   . Pancreatic cancer Mother   . Bladder Cancer Neg Hx   . Kidney cancer Neg Hx   . Prostate cancer Neg Hx     Social History   Social History  . Marital status: Single    Spouse name: N/A  . Number of children: N/A  . Years of education: N/A   Occupational History  . Not on file.   Social History Main Topics  . Smoking status: Current Every Day Smoker    Packs/day: 1.00    Types: Cigarettes  . Smokeless tobacco: Never Used  . Alcohol use No  . Drug use: No  . Sexual activity: No   Other Topics Concern  . Not on file   Social History Narrative  . No narrative on file     Current Outpatient Prescriptions:  .  ALPRAZolam (XANAX) 1 MG tablet, Take 1 tablet (1 mg total) by mouth 4 (four) times daily., Disp: 84 tablet, Rfl: 0 .  aspirin 325 MG tablet, Take 325 mg by mouth daily.,  Disp: , Rfl:  .  estrogens, conjugated, (PREMARIN) 0.625 MG tablet, Take 0.625 mg by mouth daily. Take daily for 21 days then do not take for 7 days., Disp: , Rfl:  .  sucralfate (CARAFATE) 1 g tablet, Take 1 tablet (1 g total) by mouth 4 (four) times daily -  with meals and at bedtime., Disp: 90 tablet, Rfl: 2 .  tiZANidine (ZANAFLEX) 4 MG tablet, TAKE ONE TABLET EVERY 8 HOURS AS NEEDED, Disp: 90 tablet, Rfl: 2 .  HYDROcodone-acetaminophen (NORCO) 7.5-325 MG tablet, Take 1 tablet by mouth every 8 (eight) hours as needed for moderate pain., Disp: 90 tablet, Rfl: 0  Allergies  Allergen Reactions  . Prozac [Fluoxetine] Other (See Comments)    Confusion  . Sulfa Antibiotics Nausea Only     ROS  Please see HPI for complete discussion of ROS.  Objective  Vitals:   12/25/16 1549  BP: 120/70  Pulse: 64  Resp: 16  Temp: 98.8 F (37.1 C)  TempSrc: Oral  SpO2: 96%  Weight: 146 lb (66.2 kg)  Height: 5\' 6"  (1.676 m)    Physical  Exam  Constitutional: She is oriented to person, place, and time and well-developed, well-nourished, and in no distress.  Cardiovascular: Normal rate, regular rhythm and normal heart sounds.   No murmur heard. Pulmonary/Chest: Effort normal and breath sounds normal.  Musculoskeletal:       Lumbar back: She exhibits tenderness, pain and spasm.       Back:  Neurological: She is alert and oriented to person, place, and time.  Nursing note and vitals reviewed.     Assessment & Plan  1. Spinal stenosis of lumbar region without neurogenic claudication Reviewed history and progress note from [facility], has disc herniation, started on Skelaxin 800 mg to be taken at bedtime. Advised to use heating pad.  - metaxalone (SKELAXIN) 800 MG tablet; Take 1 tablet (800 mg total) by mouth at bedtime as needed for muscle spasms.  Dispense: 30 tablet; Refill: 0  2. Need for hepatitis C screening test  - Hepatitis C Antibody   Syed [facility]ad [facility] 12/25/2016 3:58 PM

## 2016-12-27 ENCOUNTER — Other Ambulatory Visit: Payer: Self-pay | Admitting: Emergency Medicine

## 2016-12-27 DIAGNOSIS — M47816 Spondylosis without myelopathy or radiculopathy, lumbar region: Secondary | ICD-10-CM

## 2016-12-28 ENCOUNTER — Telehealth: Payer: Self-pay | Admitting: Family Medicine

## 2016-12-28 ENCOUNTER — Telehealth: Payer: Self-pay | Admitting: Obstetrics and Gynecology

## 2016-12-28 ENCOUNTER — Ambulatory Visit (INDEPENDENT_AMBULATORY_CARE_PROVIDER_SITE_OTHER): Payer: Medicaid Other

## 2016-12-28 DIAGNOSIS — R140 Abdominal distension (gaseous): Secondary | ICD-10-CM | POA: Diagnosis not present

## 2016-12-28 LAB — BASIC METABOLIC PANEL WITH GFR
BUN: 7 mg/dL (ref 7–25)
CALCIUM: 8.9 mg/dL (ref 8.6–10.4)
CO2: 26 mmol/L (ref 20–31)
CREATININE: 0.59 mg/dL (ref 0.50–1.05)
Chloride: 106 mmol/L (ref 98–110)
GFR, Est African American: >89 mL/min (ref >=60)
GFR, Est Non African American: >89 mL/min (ref >=60)
Glucose, Bld: 102 mg/dL — ABNORMAL HIGH (ref 65–99)
Potassium: 4.1 mmol/L (ref 3.5–5.3)
SODIUM: 142 mmol/L (ref 135–146)

## 2016-12-28 NOTE — Telephone Encounter (Signed)
The labs ordered yesterday (12/28/16) have not resulted yet. Will be glad to send them to [facility] gastroenterology after they have been available for review

## 2016-12-28 NOTE — Telephone Encounter (Signed)
Patricia Rollins came in for [place] today and when checking out she has a question about her premarin, dr Amalia Hailey told her to either take it or not. She said the directions are to take for 21 days then be off of it for 7? She is confused and thinks she needs to take a lesser dose also.

## 2016-12-28 NOTE — Telephone Encounter (Signed)
Pt wanted to remind you to please send her labs for 12/28/16 to be sent to [facility]

## 2016-12-28 NOTE — Telephone Encounter (Signed)
Pt is requesting a change to a lower dose of premarin. States she is still getting bad hot flashes and thinks a lower dose would be better. Please advise if this is ok.

## 2016-12-29 LAB — HEPATITIS C ANTIBODY: HCV Ab: NEGATIVE

## 2016-12-31 ENCOUNTER — Telehealth: Payer: Self-pay | Admitting: Obstetrics and Gynecology

## 2016-12-31 NOTE — Telephone Encounter (Signed)
Pt  Called and she saw Dr Amalia Hailey, he ordered and [place], I do not know what she saw him for, but she is wanting to decrease the amount of premarin she is on, Dr Amalia Hailey told that since he Did not prescribe it that she would need to f/u with the Dr that did, you were the one that prescribed it so she is wanting to lower the amount.

## 2016-12-31 NOTE — Telephone Encounter (Signed)
Spoke with patient. Since the premarin was not prescribed or managed by Dr. Amalia Hailey patient was referred back to the original prescriber. Patient expressed understanding and will followup with that provider. Also inquired about U/S results. Those results are not available but informed patient as soon as they are released a followup call will be made.

## 2017-01-01 MED ORDER — ESTROGENS CONJUGATED 0.3 MG PO TABS: 0.3000 mg | ORAL_TABLET | Freq: Every day | ORAL | 11 refills | Status: DC

## 2017-01-01 NOTE — Telephone Encounter (Signed)
Please let patient know that I have sent in a prescription to decrease her dose from 0.625 down to 0.3 mg.  It has been sent to your pharmacy.

## 2017-01-01 NOTE — Telephone Encounter (Signed)
Per cherry

## 2017-01-02 ENCOUNTER — Telehealth: Payer: Self-pay | Admitting: Obstetrics and Gynecology

## 2017-01-02 ENCOUNTER — Telehealth: Payer: Self-pay | Admitting: Emergency Medicine

## 2017-01-02 NOTE — Telephone Encounter (Signed)
Called pt informed her of new rx sent in, pt also questions u/s results. Advised her that Dr.Evans should follow up with her.

## 2017-01-02 NOTE — Telephone Encounter (Signed)
Pt had an interna [place] last Friday.  She has not gotten the results back.  Her call back is [phone]  Thank sTeri

## 2017-01-02 NOTE — Telephone Encounter (Signed)
Would like to no if you would order abdominal US or refer her to Dr. Bary Castilla.

## 2017-01-02 NOTE — Telephone Encounter (Signed)
Message left on patients voice mail of normal results per provider

## 2017-01-03 ENCOUNTER — Encounter: Payer: Self-pay | Admitting: Family Medicine

## 2017-01-03 ENCOUNTER — Ambulatory Visit: Payer: Medicaid Other | Admitting: Family Medicine

## 2017-01-03 ENCOUNTER — Ambulatory Visit (INDEPENDENT_AMBULATORY_CARE_PROVIDER_SITE_OTHER): Payer: Medicaid Other | Admitting: Family Medicine

## 2017-01-03 VITALS — BP 112/70 | HR 106 | Temp 98.7°F | Resp 16 | Ht 66.0 in | Wt 135.1 lb

## 2017-01-03 DIAGNOSIS — K921 Melena: Secondary | ICD-10-CM | POA: Diagnosis not present

## 2017-01-03 DIAGNOSIS — N289 Disorder of kidney and ureter, unspecified: Secondary | ICD-10-CM | POA: Diagnosis not present

## 2017-01-03 DIAGNOSIS — R10812 Left upper quadrant abdominal tenderness: Secondary | ICD-10-CM | POA: Insufficient documentation

## 2017-01-03 LAB — HEMOCCULT GUIAC POC 1CARD (OFFICE): Fecal Occult Blood, POC: POSITIVE — AB

## 2017-01-03 NOTE — Telephone Encounter (Signed)
Patient has appointment today, will discuss at that time

## 2017-01-03 NOTE — Progress Notes (Signed)
Name: Patricia Rollins   MRN: [id number]    DOB: [birth year]   Date:01/03/2017       Progress Note  Subjective  Chief Complaint  Chief Complaint  Patient presents with  . Follow-up    discuss [place] and referral    HPI  Pt. Presents with follow up for left abdominal pain along with constipation, this has been on ongoing problem for several weeks, she has been referred to a GI specialist in [place] who recommended a battery of tests for her symptoms and most of these were to be ordered so that she could get the tests done closer to where she lives.  She has recurrent left abdominal pain and left lower back pain, rated at 9/10, she has been nauseous, no vomiting (no dry heaves), no blood in stool but has noticed black colored stools on and off. She is taking Miralax every night which seems to help with constipation.  She has been seen first by Dr. Vicente Males in [place] and also by Dr. Denice Paradise in [facility] for GI related symptoms. Dr. Denice Paradise has ordered a CT scan of abdomen and pelvis which is scheduled for tomorrow. Due to her symptoms, she still wants to have a Ultrasound to evaluate her gallbladder and advice on the next steps regarding the renal lesion seen on the CT Scan for hematuria work up back in September 2017.     Past Medical History:  Diagnosis Date  . Anxiety   . Anxiety   . Arthritis    patient has bilateral bursitis in hips  . Depression   . Fibromyalgia   . Hematuria 06/21/2016  . Neuromuscular disorder [facility])     Past Surgical History:  Procedure Laterality Date  . ABDOMINAL HYSTERECTOMY    . APPENDECTOMY    . OOPHORECTOMY    . TONSILLECTOMY AND ADENOIDECTOMY Bilateral     Family History  Problem Relation Age of Onset  . Ovarian cancer Mother   . Pancreatic cancer Mother   . Bladder Cancer Neg Hx   . Kidney cancer Neg Hx   . Prostate cancer Neg Hx     Social History   Social History  . Marital status: Single    Spouse name: N/A  . Number of children: N/A  .  Years of education: N/A   Occupational History  . Not on file.   Social History Main Topics  . Smoking status: Current Every Day Smoker    Packs/day: 1.00    Types: Cigarettes  . Smokeless tobacco: Never Used  . Alcohol use No  . Drug use: No  . Sexual activity: No   Other Topics Concern  . Not on file   Social History Narrative  . No narrative on file     Current Outpatient Prescriptions:  .  ALPRAZolam (XANAX) 1 MG tablet, Take 1 tablet (1 mg total) by mouth 4 (four) times daily., Disp: 84 tablet, Rfl: 0 .  aspirin 325 MG tablet, Take 325 mg by mouth daily., Disp: , Rfl:  .  estrogens, conjugated, (PREMARIN) 0.3 MG tablet, Take 1 tablet (0.3 mg total) by mouth daily. Take daily for 21 days then do not take for 7 days., Disp: 30 tablet, Rfl: 11 .  HYDROcodone-acetaminophen (NORCO) 7.5-325 MG tablet, Take 1 tablet by mouth every 8 (eight) hours as needed for moderate pain., Disp: 90 tablet, Rfl: 0 .  metaxalone (SKELAXIN) 800 MG tablet, Take 1 tablet (800 mg total) by mouth at bedtime as needed for muscle spasms.,  Disp: 30 tablet, Rfl: 0 .  sucralfate (CARAFATE) 1 g tablet, Take 1 tablet (1 g total) by mouth 4 (four) times daily -  with meals and at bedtime., Disp: 90 tablet, Rfl: 2  Allergies  Allergen Reactions  . Prozac [Fluoxetine] Other (See Comments)    Confusion  . Sulfa Antibiotics Nausea Only     Review of Systems  Constitutional: Negative for chills and fever.  Gastrointestinal: Positive for blood in stool and nausea. Negative for vomiting.  Genitourinary: Negative for dysuria, frequency and hematuria.    Objective  Vitals:   01/03/17 1334  BP: 112/70  Pulse: (!) 106  Resp: 16  Temp: 98.7 F (37.1 C)  TempSrc: Oral  SpO2: 94%  Weight: 135 lb 1.6 oz (61.3 kg)  Height: 5\' 6"  (1.676 m)    Physical Exam  Constitutional: She is oriented to person, place, and time and well-developed, well-nourished, and in no distress.  Cardiovascular: Normal rate,  regular rhythm and normal heart sounds.   No murmur heard. Pulmonary/Chest: Effort normal and breath sounds normal. She has no wheezes.  Abdominal: Soft. Bowel sounds are normal. There is tenderness in the left upper quadrant. There is CVA tenderness.  Genitourinary: Rectal exam shows external hemorrhoid and guaiac positive stool.  Neurological: She is alert and oriented to person, place, and time.  Nursing note and vitals reviewed.       Assessment & Plan  1. Blood in stool Positive for Hemoccult, patient advised to see Dr. Vicente Males for diagnostic colonoscopy - POCT Occult Blood Stool  2. Left upper quadrant abdominal tenderness, rebound tenderness presence not specified Obtain ultrasound of the abdomen to rule out any pathology associated with the spleen - US Abdomen Complete; Future  3. 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 01/03/2017 1:37 PM

## 2017-01-04 ENCOUNTER — Other Ambulatory Visit: Payer: Self-pay | Admitting: Family Medicine

## 2017-01-04 DIAGNOSIS — N289 Disorder of kidney and ureter, unspecified: Secondary | ICD-10-CM

## 2017-01-04 DIAGNOSIS — R1032 Left lower quadrant pain: Secondary | ICD-10-CM

## 2017-01-07 ENCOUNTER — Ambulatory Visit
Admission: RE | Admit: 2017-01-07 | Discharge: 2017-01-07 | Disposition: A | Discharge status: Home / Self Care | Payer: Medicaid Other | Source: Ambulatory Visit | Attending: Family Medicine | Admitting: Family Medicine

## 2017-01-07 ENCOUNTER — Telehealth: Payer: Self-pay | Admitting: Obstetrics and Gynecology

## 2017-01-07 DIAGNOSIS — N952 Postmenopausal atrophic vaginitis: Secondary | ICD-10-CM

## 2017-01-07 DIAGNOSIS — R10812 Left upper quadrant abdominal tenderness: Secondary | ICD-10-CM | POA: Insufficient documentation

## 2017-01-07 DIAGNOSIS — N289 Disorder of kidney and ureter, unspecified: Secondary | ICD-10-CM

## 2017-01-07 DIAGNOSIS — R1032 Left lower quadrant pain: Secondary | ICD-10-CM

## 2017-01-07 NOTE — Telephone Encounter (Signed)
Pt called with questions regarding the instructions for the primarin rx.  Please call pt back at [phone]  Thanks, Con Memos

## 2017-01-08 NOTE — Telephone Encounter (Signed)
No.  The signature was copied incorrectly from a prior prescription.  She can continue taking the hormones daily.  No need to stop for 7 days.

## 2017-01-08 NOTE — Telephone Encounter (Signed)
Can you tell me why the sig on the most recent dose of premarin is different from the one we discontinued, pt wants to know. Thanks

## 2017-01-09 MED ORDER — ESTROGENS CONJUGATED 0.3 MG PO TABS: 0.3000 mg | ORAL_TABLET | Freq: Every day | ORAL | 11 refills | Status: DC

## 2017-01-09 NOTE — Telephone Encounter (Signed)
Called pt informed her that sig for 21 days on and 7 off is incorrect per Dr.Cherry. Pt is to take Premarin cream 1 daily by mouth.

## 2017-01-11 ENCOUNTER — Telehealth: Payer: Self-pay | Admitting: Family Medicine

## 2017-01-11 NOTE — Telephone Encounter (Signed)
Pt would like a call back

## 2017-01-15 ENCOUNTER — Ambulatory Visit (INDEPENDENT_AMBULATORY_CARE_PROVIDER_SITE_OTHER): Payer: Medicaid Other | Admitting: *Deleted

## 2017-01-15 ENCOUNTER — Ambulatory Visit: Payer: Medicaid Other | Admitting: Family Medicine

## 2017-01-15 DIAGNOSIS — D171 Benign lipomatous neoplasm of skin and subcutaneous tissue of trunk: Secondary | ICD-10-CM

## 2017-01-15 NOTE — Telephone Encounter (Signed)
Patient coming for appointment. Will discuss with Dr. Manuella Ghazi her concerns

## 2017-01-15 NOTE — Progress Notes (Signed)
Patient came in today for a wound check.  The wound is clean, with no signs of infection noted.  Very tiny white suture. Unable to remove. Neosporin applied with band aid.

## 2017-01-17 ENCOUNTER — Telehealth: Payer: Self-pay | Admitting: Cardiovascular Disease

## 2017-01-17 NOTE — Telephone Encounter (Signed)
Pt calling stating she just found out she has Cholesterol stones She saw a Kidney doctor Tuesday and they told her to give [place] a call to keep [place] updated as well.  She think it may been caused by the Lovastatin  When she started that she was having some stomach issues (she started back in oct)  She has not taken it since Christmas  If we have any questions or concerns to call her back .

## 2017-01-17 NOTE — Telephone Encounter (Signed)
Pt started Crestor 5 mg in November and took for approximately 2 months. Nephrology advised that this most likely caused her gallstones, as she developed stomach issues shortly after starting this. Pt wanted to make Dr. Rockey Situ aware.

## 2017-01-21 ENCOUNTER — Ambulatory Visit: Payer: Medicaid Other | Admitting: Family Medicine

## 2017-01-22 ENCOUNTER — Encounter: Payer: Self-pay | Admitting: Gastroenterology

## 2017-01-22 ENCOUNTER — Ambulatory Visit: Payer: Medicaid Other | Admitting: Family Medicine

## 2017-01-22 ENCOUNTER — Ambulatory Visit (INDEPENDENT_AMBULATORY_CARE_PROVIDER_SITE_OTHER): Payer: Medicaid Other | Admitting: Gastroenterology

## 2017-01-22 ENCOUNTER — Ambulatory Visit: Payer: Medicaid Other | Admitting: Gastroenterology

## 2017-01-22 VITALS — BP 113/76 | HR 85 | Temp 98.1°F | Ht 66.0 in | Wt 142.0 lb

## 2017-01-22 DIAGNOSIS — K30 Functional dyspepsia: Secondary | ICD-10-CM | POA: Diagnosis not present

## 2017-01-22 DIAGNOSIS — K581 Irritable bowel syndrome with constipation: Secondary | ICD-10-CM

## 2017-01-22 MED ORDER — LACTULOSE 10 G PO PACK: 10.0000 g | PACK | Freq: Three times a day (TID) | ORAL | 0 refills | Status: DC

## 2017-01-22 NOTE — Progress Notes (Signed)
Primary Care Physician: Keith Rake, MD  Primary Gastroenterologist:  Dr. Jonathon Bellows   Chief Complaint  Patient presents with  . Multiple Complaints    HPI: Patricia Rollins is a 56 y.o. female   She is here for a follow up visit.  Summary of history :  She was initially seen in 10/2016 for constipation ongoing since 09/2016, on hydrocodone for fibromnyalgia. X ray showed a lot of stool in right colon. She was prescribed linzess by Dr Manuella Ghazi but had not commenced it .  Interval history 10/2016-01/2017  In 11/2016 she requested a new GI specialist at [facility] and she was seen by Dr Denice Paradise  at [facility]. She susequently had a USG abdomen and pelvis which was normal. She was also seen at the ER on one occasion for pain and discharged.   Stool occult positive 2 weeks back and was sent back to [place] for a colonoscopy.   Presently has a bowel movement only with laxatives. She says she takes dulcolax as linzess caused her to have a headache. Even with the dulcolax she does not have a good bowel movement . She says the "stones get stuck".   Still complains of abdominal pain. She has not stopped smoking as yet .   Completely stopped narcotics. She takes Carafate. Says did not help. She says she tried Zantac and omeprazole which caused her a headache and she stopped.  She is presently is using peppermint oil.     Current Outpatient Prescriptions  Medication Sig Dispense Refill  . ALPRAZolam (XANAX) 1 MG tablet Take 1 tablet (1 mg total) by mouth 4 (four) times daily. 84 tablet 0  . aspirin 325 MG tablet Take 325 mg by mouth daily.    Marland Kitchen estrogens, conjugated, (PREMARIN) 0.3 MG tablet Take 1 tablet (0.3 mg total) by mouth daily. 30 tablet 11  . HYDROcodone-acetaminophen (NORCO) 7.5-325 MG tablet Take 1 tablet by mouth every 8 (eight) hours as needed for moderate pain. 90 tablet 0  . metaxalone (SKELAXIN) 800 MG tablet Take 1 tablet (800 mg total) by mouth at bedtime as needed for muscle spasms. 30  tablet 0  . sucralfate (CARAFATE) 1 g tablet Take 1 tablet (1 g total) by mouth 4 (four) times daily -  with meals and at bedtime. 90 tablet 2   No current facility-administered medications for this visit.     Allergies as of 01/22/2017 - Review Complete 01/22/2017  Allergen Reaction Noted  . Prozac [fluoxetine] Other (See Comments) 07/17/2015  . Sulfa antibiotics Nausea Only 05/12/2015    ROS:  General: Negative for anorexia, weight loss, fever, chills, fatigue, weakness. ENT: Negative for hoarseness, difficulty swallowing , nasal congestion. CV: Negative for chest pain, angina, palpitations, dyspnea on exertion, peripheral edema.  Respiratory: Negative for dyspnea at rest, dyspnea on exertion, cough, sputum, wheezing.  GI: See history of present illness. GU:  Negative for dysuria, hematuria, urinary incontinence, urinary frequency, nocturnal urination.  Endo: Negative for unusual weight change.    Physical Examination:   BP 113/76   Pulse 85   Temp 98.1 F (36.7 C) (Oral)   Ht 5\' 6"  (1.676 m)   Wt 142 lb (64.4 kg)   BMI 22.92 kg/m   General: Well-nourished, well-developed in no acute distress.  Eyes: No icterus. Conjunctivae pink. Mouth: Oropharyngeal mucosa moist and pink , no lesions erythema or exudate. Lungs: Clear to auscultation bilaterally. Non-labored. Heart: Regular rate and rhythm, no murmurs rubs or gallops.  Abdomen: Bowel sounds  are normal, nontender, nondistended, no hepatosplenomegaly or masses, no abdominal bruits or hernia , no rebound or guarding.   Extremities: No lower extremity edema. No clubbing or deformities. Neuro: Alert and oriented x 3.  Grossly intact. Skin: Warm and dry, no jaundice.   Psych: Alert and cooperative, normal mood and affect.  Labs:  CBC Latest Ref Rng & Units 07/18/2016  WBC 3.8 - 10.8 K/uL 7.3  Hemoglobin 11.7 - 15.5 g/dL 13.5  Hematocrit 35.0 - 45.0 % 41.2  Platelets 140 - 400 K/uL 319    Imaging Studies: US Abdomen  Complete  Result Date: 01/07/2017 CLINICAL DATA:  Left upper quadrant abdominal pain for 6 months. EXAM: ABDOMEN ULTRASOUND COMPLETE COMPARISON:  CT scan of July 31, 2016. FINDINGS: Gallbladder: No gallstones or wall thickening visualized. No sonographic Murphy sign noted by sonographer. Common bile duct: Diameter: 5.3 mm which is within normal limits. Liver: 5 mm focal calcification is seen in right hepatic lobe. Within normal limits in parenchymal echogenicity. IVC: No abnormality visualized. Pancreas: Visualized portion unremarkable. Spleen: Size and appearance within normal limits. Right Kidney: Length: 11.3 cm. Echogenicity within normal limits. No mass or hydronephrosis visualized. Left Kidney: Length: 11.3 cm. Echogenicity within normal limits. No mass or hydronephrosis visualized. Abdominal aorta: No aneurysm visualized. Other findings: Bladder is within normal limits. Bilateral ureteral jets are noted. IMPRESSION: No significant abnormality seen in the abdomen. Electronically Signed   By: Marijo Conception, M.D.   On: 01/07/2017 11:04   US Transvaginal Non-ob  Result Date: 01/01/2017 ULTRASOUND REPORT Location: [facility] Date of Service: 12/28/16 Indications: Abdominal bloating with pain L>R Findings: The uterus and ovaries are surgically absent. Survey of the adnexa demonstrates no adnexal masses or abnormalities. There is a large amount of bowel activity noted on EV ultrasound. There is no free fluid in the cul de sac. Impression: 1. Normal appearing pelvic ultrasound s/p hysterectomy and bilateral oophorectomy Recommendations: 1.Clinical correlation with the patient's History and Physical Exam. Macarthur Critchley, RDMS, RVT The ultrasound images and findings were reviewed by me and I agree with the above report. Finis Bud, M.D. 01/01/2017 11:32 AM   US Pelvis Limited  Result Date: 01/07/2017 CLINICAL DATA:  Pain and tenderness in region of bladder EXAM: ULTRASOUND URINARY BLADDER  TECHNIQUE: Transabdominal ultrasound examination of the pelvis was performed including evaluation of the uterus, ovaries, adnexal regions, and pelvic cul-de-sac. COMPARISON:  None. FINDINGS: Urinary bladder is normal in size and contour. There is no mass or wall thickening. Flow from each distal ureter is seen in the urinary bladder. IMPRESSION: Ultrasound examination of the urinary bladder is within normal limits. Electronically Signed   By: Lowella Grip III M.D.   On: 01/07/2017 11:03    Assessment and Plan:   JACLYNE HAVERSTICK is a 56 y.o. y/o female initially referred for constipation . After her initial visit with me , she was seen by [facility] . I advised her to stick with one practice to help provide appropriate care. she has decided to stay with [place]. Durint the visit today her symptoms were numerous ranging from headaches, gas, kidney stones to constipation. I explained to her in terms of her GI work up the most important was to go through a colonoscopy at this time since her stool test was positive for blood. I also offered to perform an EGD at the same time for dyspeptic symptoms . 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 She complains of side effects  to pretty much every agent I suggest for constipation . I strongly feel her constipation is a significant factor for her abdominal discomfort. She has not lost any weight since last visit and continues to smoke.   Plan:  1. Strongly suggested  A colonoscopy due to new onset worsening of constipation and stool , she will decide and give my office a call if she decides to pursue EGD/Colonoscopy 2. Diet very low in  Fiber, suggest 25 grams of dietary fiber a day  3. Trial of lactulose 4. Stongly advised her to quit smoking as it can be contributing to her bloating    Dr Jonathon Bellows  MD Follow up as needed

## 2017-01-23 ENCOUNTER — Ambulatory Visit: Payer: Medicaid Other | Admitting: Family Medicine

## 2017-01-23 NOTE — Telephone Encounter (Signed)
Spoke w/ pt.  She is upset that we have not returned her call from last week.  Apologized, as the message looked as if she were giving [place] info and did not need a recommendation. Pt states that she would like to come in to discuss her various questions, but she has been unable to make an appt due to having [employer].  She states that she was told at her last ov that she has a blockage in her abdomen and she would like to know the specific location.  She would like to know if she should continue on aspirin 81 mg BID if she has been put on carafate (3 mos ago) for possible ulcers and stomach bleed (she is unsure if she has been dx w/ these or if is precautionary). She would like to know is she should continue taking CoQ10 w/ her lovastatin. She was taking 1/2 Crestor that was recently switched to lovastatin; she has been taking 1/2 of this, but she feels the cramps are wiping her out, esp in addition to her fibromyalgia. After speaking w/ scheduling, we can sched her to be seen w/o PA from PCP. Pt sched to see Dr. Rockey Situ on 02/05/17 @ 4:00.

## 2017-01-29 ENCOUNTER — Encounter: Payer: Self-pay | Admitting: *Deleted

## 2017-01-29 ENCOUNTER — Emergency Department
Admission: EM | Admit: 2017-01-29 | Discharge: 2017-01-30 | Disposition: A | Discharge status: Home / Self Care | Payer: Medicaid Other | Attending: Emergency Medicine | Admitting: Emergency Medicine

## 2017-01-29 DIAGNOSIS — M546 Pain in thoracic spine: Secondary | ICD-10-CM | POA: Diagnosis not present

## 2017-01-29 DIAGNOSIS — Z7982 Long term (current) use of aspirin: Secondary | ICD-10-CM | POA: Diagnosis not present

## 2017-01-29 DIAGNOSIS — Z79891 Long term (current) use of opiate analgesic: Secondary | ICD-10-CM | POA: Insufficient documentation

## 2017-01-29 DIAGNOSIS — N760 Acute vaginitis: Secondary | ICD-10-CM | POA: Diagnosis not present

## 2017-01-29 DIAGNOSIS — B9689 Other specified bacterial agents as the cause of diseases classified elsewhere: Secondary | ICD-10-CM

## 2017-01-29 DIAGNOSIS — R102 Pelvic and perineal pain unspecified side: Secondary | ICD-10-CM

## 2017-01-29 DIAGNOSIS — I2510 Atherosclerotic heart disease of native coronary artery without angina pectoris: Secondary | ICD-10-CM | POA: Insufficient documentation

## 2017-01-29 DIAGNOSIS — F17210 Nicotine dependence, cigarettes, uncomplicated: Secondary | ICD-10-CM | POA: Insufficient documentation

## 2017-01-29 LAB — COMPREHENSIVE METABOLIC PANEL
ALBUMIN: 3.6 g/dL (ref 3.5–5.0)
ALK PHOS: 74 U/L (ref 38–126)
ALT: 12 U/L — ABNORMAL LOW (ref 14–54)
ANION GAP: 8 (ref 5–15)
AST: 24 U/L (ref 15–41)
BILIRUBIN TOTAL: 0.4 mg/dL (ref 0.3–1.2)
CHLORIDE: 103 mmol/L (ref 101–111)
CO2: 27 mmol/L (ref 22–32)
Calcium: 8.8 mg/dL — ABNORMAL LOW (ref 8.9–10.3)
Creatinine, Ser: 0.54 mg/dL (ref 0.44–1.00)
GFR calc non Af Amer: >60 mL/min (ref >60)
Glucose, Bld: 118 mg/dL — ABNORMAL HIGH (ref 65–99)
POTASSIUM: 2.8 mmol/L — AB (ref 3.5–5.1)
SODIUM: 138 mmol/L (ref 135–145)
Total Protein: 7.2 g/dL (ref 6.5–8.1)

## 2017-01-29 LAB — URINALYSIS, COMPLETE (UACMP) WITH MICROSCOPIC
BACTERIA UA: NONE SEEN
BILIRUBIN URINE: NEGATIVE
Glucose, UA: NEGATIVE mg/dL
KETONES UR: NEGATIVE mg/dL
LEUKOCYTES UA: NEGATIVE
Nitrite: NEGATIVE
Protein, ur: NEGATIVE mg/dL
Specific Gravity, Urine: 1.01 (ref 1.005–1.030)
pH: 6 (ref 5.0–8.0)

## 2017-01-29 LAB — CBC
HEMATOCRIT: 41.1 % (ref 35.0–47.0)
HEMOGLOBIN: 14.1 g/dL (ref 12.0–16.0)
MCH: 31.6 pg (ref 26.0–34.0)
MCHC: 34.3 g/dL (ref 32.0–36.0)
MCV: 92.1 fL (ref 80.0–100.0)
Platelets: 353 10*3/uL (ref 150–440)
RBC: 4.47 MIL/uL (ref 3.80–5.20)
RDW: 12.9 % (ref 11.5–14.5)
WBC: 9.8 10*3/uL (ref 3.6–11.0)

## 2017-01-29 LAB — LIPASE, BLOOD: Lipase: 40 U/L (ref 11–51)

## 2017-01-29 NOTE — ED Triage Notes (Signed)
Pt arrived to ED via EMS from home reporting increased lower abd pain and vaginal pain. PT has hx of chronic abd pain for since August of 2017. Pt reports nausea and constipation that has been treated with success at home using miralax. Pt also reports having left shoulder pain x 3 months that has increased this evening and developed into sharp pain in between shoulder blades. Pt denies CP, SOB, cough or injury.   No fevers, no vomiting, no changes in urination.

## 2017-01-29 NOTE — ED Notes (Signed)
Pt verbalized having taken Carafate that was prescribed last visit. Pt reports she has not followed up with PCP or GI doctor. Pt reports Carafate has decreased abd pain.

## 2017-01-30 ENCOUNTER — Emergency Department: Payer: Medicaid Other

## 2017-01-30 ENCOUNTER — Telehealth: Payer: Self-pay | Admitting: Cardiovascular Disease

## 2017-01-30 ENCOUNTER — Telehealth: Payer: Self-pay | Admitting: Obstetrics and Gynecology

## 2017-01-30 ENCOUNTER — Ambulatory Visit: Payer: Medicaid Other | Admitting: Cardiovascular Disease

## 2017-01-30 DIAGNOSIS — Z1231 Encounter for screening mammogram for malignant neoplasm of breast: Secondary | ICD-10-CM

## 2017-01-30 LAB — TROPONIN I: Troponin I: <0.03 ng/mL (ref <0.03)

## 2017-01-30 LAB — WET PREP, GENITAL
SPERM: NONE SEEN
TRICH WET PREP: NONE SEEN
Yeast Wet Prep HPF POC: NONE SEEN

## 2017-01-30 MED ORDER — SUCRALFATE 1 G PO TABS: 1.0000 g | ORAL_TABLET | Freq: Once | ORAL | Status: DC

## 2017-01-30 MED ORDER — TRAMADOL HCL 50 MG PO TABS
50.0000 mg | ORAL_TABLET | Freq: Once | ORAL | Status: DC
  Filled 2017-01-30: qty 1

## 2017-01-30 MED ORDER — ONDANSETRON HCL 4 MG/2ML IJ SOLN
4.0000 mg | Freq: Once | INTRAMUSCULAR | Status: DC
  Filled 2017-01-30: qty 2

## 2017-01-30 MED ORDER — POTASSIUM CHLORIDE CRYS ER 20 MEQ PO TBCR
40.0000 meq | EXTENDED_RELEASE_TABLET | Freq: Once | ORAL | Status: AC
  Administered 2017-01-30: 40 meq via ORAL
  Filled 2017-01-30: qty 2

## 2017-01-30 MED ORDER — METRONIDAZOLE 500 MG PO TABS: 500.0000 mg | ORAL_TABLET | Freq: Two times a day (BID) | ORAL | 0 refills | Status: AC

## 2017-01-30 MED ORDER — PROMETHAZINE HCL 12.5 MG PO TABS: 12.5000 mg | ORAL_TABLET | Freq: Four times a day (QID) | ORAL | 0 refills | Status: DC | PRN

## 2017-01-30 MED ORDER — SUCRALFATE 1 G PO TABS
1.0000 g | ORAL_TABLET | Freq: Once | ORAL | Status: AC
  Administered 2017-01-30: 1 g via ORAL

## 2017-01-30 MED ORDER — SUCRALFATE 1 G PO TABS
ORAL_TABLET | ORAL | Status: AC
  Administered 2017-01-30: 1 g via ORAL
  Filled 2017-01-30: qty 1

## 2017-01-30 NOTE — ED Notes (Signed)

## 2017-01-30 NOTE — ED Provider Notes (Signed)
[facility] Emergency Department Provider Note   ____________________________________________   First MD Initiated Contact with Patient 01/29/17 2317     (approximate)  I have reviewed the triage vital signs and the nursing notes.   HISTORY  Chief Complaint Abdominal Pain and Shoulder Pain    HPI Patricia Rollins is a 56 y.o. female who comes into the hospital today with some pain and pressure between her shoulder blades as well as pain in her vaginal area. She reports that it all started tonight. She felt like pressure that was in her back between her shoulder blades like a rocker elephant was on her back. She also reports that she had a sharp needlelike pain in her vagina when she has a bowel movement. She contacted EMS. The back pain improved but that's when the vaginal pain started. She rates vaginal pain and 8 out of 10 in intensity. The patient has blood in her urine and reports that she has since August. The patient has also had renal studies for abdominal pain and discomfort. The patient also states that she has a lot of blockages in her heart. She has thought that she might of had some kidney stones causing some of her symptoms but every time she's been evaluated she has not had any kidney stones. The patient is concerned and decided to come into the hospital today.   Past Medical History:  Diagnosis Date  . Anxiety   . Anxiety   . Arthritis    patient has bilateral bursitis in hips  . Depression   . Fibromyalgia   . Hematuria 06/21/2016  . Neuromuscular disorder [facility])     Patient Active Problem List   Diagnosis Date Noted  . LUQ abdominal tenderness 01/03/2017  . Renal lesion 01/03/2017  . Spinal stenosis of lumbar region 12/25/2016  . Epigastric abdominal pain 11/21/2016  . Acute constipation 10/23/2016  . Lipoma of chest wall 10/23/2016  . Coronary artery disease involving coronary bypass graft of native heart without angina pectoris  09/13/2016  . Aortic atherosclerosis ([place]) 09/13/2016  . PAD (peripheral artery disease) ([place]) 09/13/2016  . Pure hypercholesterolemia 09/13/2016  . Marijuana use 08/09/2016  . Chest pain of uncertain etiology 70/96/2836  . Chronic lower extremity pain (Bilateral) (L>R) 07/26/2016  . Chronic upper extremity pain (Bilateral) (L>R) 07/26/2016  . Smoker 07/25/2016  . Cigarette nicotine dependence 07/25/2016  . Lumbar facet syndrome (Bilateral) (R>L) 07/25/2016  . Chronic sacroiliac joint pain (Bilateral) (R>L) 07/25/2016  . Chronic shoulder pain (Left) 07/25/2016  . Peripheral neuropathy (HCC) (lower extremity) (Bilateral) (L>R) 07/25/2016  . Neurogenic pain 07/25/2016  . Neuropathic pain 07/25/2016  . Musculoskeletal pain 07/25/2016  . Chronic neck pain (Bilateral) (L>R) 07/25/2016  . Long term current use of opiate analgesic 07/24/2016  . Long term prescription opiate use 07/24/2016  . Opiate use 07/24/2016  . Encounter for therapeutic drug level monitoring 07/24/2016  . Encounter for pain management planning 07/24/2016  . Long term prescription benzodiazepine use 07/24/2016  . Chronic hip pain (Location of Primary Source of Pain) (Bilateral) (R>L) 07/24/2016  . Annual physical exam 07/18/2016  . Colon cancer screening 07/18/2016  . Tongue lesion 06/21/2016  . Abnormal mammogram 11/08/2015  . Trochanteric bursitis (Bilateral) 08/16/2015  . Hot flash, menopausal 08/16/2015  . Compulsive tobacco user syndrome 08/16/2015  . Chronic low back pain (Location of Secondary source of pain) (Bilateral) (R>L) 08/15/2015  . CD (contact dermatitis) 08/15/2015  . Menopausal and perimenopausal disorder 08/15/2015  .  Chronic pain 08/15/2015  . Menopausal symptom 08/15/2015  . Chronic lumbar pain 05/12/2015  . Fibromyalgia 05/12/2015  . Generalized anxiety disorder 05/12/2015  . Chronic pain disorder 07/12/2010  . Lumbosacral neuritis 02/23/2010    Past Surgical History:  Procedure  Laterality Date  . ABDOMINAL HYSTERECTOMY    . APPENDECTOMY    . OOPHORECTOMY    . TONSILLECTOMY AND ADENOIDECTOMY Bilateral     Prior to Admission medications   Medication Sig Start Date End Date Taking? Authorizing Provider  ALPRAZolam Duanne Moron) 1 MG tablet Take 1 tablet (1 mg total) by mouth 4 (four) times daily. 06/21/16   Roselee Nova, MD  aspirin 325 MG tablet Take 325 mg by mouth daily.    Historical Provider, MD  estrogens, conjugated, (PREMARIN) 0.3 MG tablet Take 1 tablet (0.3 mg total) by mouth daily. Patient not taking: Reported on 01/22/2017 01/09/17   Rubie Maid, MD  HYDROcodone-acetaminophen (NORCO) 7.5-325 MG tablet Take 1 tablet by mouth every 8 (eight) hours as needed for moderate pain. Patient not taking: Reported on 01/22/2017 08/27/16   Roselee Nova, MD  lactulose (CEPHULAC) 10 g packet Take 1 packet (10 g total) by mouth 3 (three) times daily. 01/22/17   Jonathon Bellows, MD  metaxalone (SKELAXIN) 800 MG tablet Take 1 tablet (800 mg total) by mouth at bedtime as needed for muscle spasms. 12/25/16   Roselee Nova, MD  metroNIDAZOLE (FLAGYL) 500 MG tablet Take 1 tablet (500 mg total) by mouth 2 (two) times daily. 01/30/17 02/06/17  Loney Hering, MD  promethazine (PHENERGAN) 12.5 MG tablet Take 1 tablet (12.5 mg total) by mouth every 6 (six) hours as needed for nausea or vomiting. 01/30/17   Loney Hering, MD  sucralfate (CARAFATE) 1 g tablet Take 1 tablet (1 g total) by mouth 4 (four) times daily -  with meals and at bedtime. 11/16/16   Jonathon Bellows, MD    Allergies Prozac [fluoxetine] and Sulfa antibiotics  Family History  Problem Relation Age of Onset  . Ovarian cancer Mother   . Pancreatic cancer Mother   . Bladder Cancer Neg Hx   . Kidney cancer Neg Hx   . Prostate cancer Neg Hx     Social History Social History  Substance Use Topics  . Smoking status: Current Every Day Smoker    Packs/day: 1.00    Types: Cigarettes  . Smokeless tobacco: Never Used  .  Alcohol use No    Review of Systems Constitutional: No fever/chills Eyes: No visual changes. ENT: No sore throat. Cardiovascular:  chest pain. Respiratory: Denies shortness of breath. Gastrointestinal:  abdominal pain, nausea, no vomiting.  No diarrhea.  No constipation. Genitourinary: Vaginal pain Musculoskeletal:  back pain. Skin: Negative for rash. Neurological: Negative for headaches, focal weakness or numbness.  10-point ROS otherwise negative.  ____________________________________________   PHYSICAL EXAM:  VITAL SIGNS: ED Triage Vitals  Enc Vitals Group     BP 01/29/17 2226 (!) 143/86     Pulse Rate 01/29/17 2226 70     Resp 01/29/17 2226 16     Temp 01/29/17 2226 98.4 F (36.9 C)     Temp Source 01/29/17 2226 Oral     SpO2 01/29/17 2221 96 %     Weight 01/29/17 2226 143 lb (64.9 kg)     Height 01/29/17 2226 5\' 6"  (1.676 m)     Head Circumference --      Peak Flow --  Pain Score 01/29/17 2226 5     Pain Loc --      Pain Edu? --      Excl. in [place]? --     Constitutional: Alert and oriented. Well appearing and in Moderate distress. Eyes: Conjunctivae are normal. PERRL. EOMI. Head: Atraumatic. Nose: No congestion/rhinnorhea. Mouth/Throat: Mucous membranes are moist.  Oropharynx non-erythematous. Cardiovascular: Normal rate, regular rhythm. Grossly normal heart sounds.  Good peripheral circulation. Respiratory: Normal respiratory effort.  No retractions. Lungs CTAB. Gastrointestinal: Soft and nontender. No distention. Positive bowel sounds Genitourinary: Normal external genitalia, normal vaginal vault, no cervix visualized due to previous hysterectomy, tenderness on pelvic exam. No significant discharge. Musculoskeletal: No lower extremity tenderness nor edema.   Neurologic:  Normal speech and language.  Skin:  Skin is warm, dry and intact.  Psychiatric: Mood and affect are normal.   ____________________________________________   LABS (all labs ordered  are listed, but only abnormal results are displayed)  Labs Reviewed  WET PREP, GENITAL - Abnormal; Notable for the following:       Result Value   Clue Cells Wet Prep HPF POC PRESENT (*)    WBC, Wet Prep HPF POC MODERATE (*)    All other components within normal limits  COMPREHENSIVE METABOLIC PANEL - Abnormal; Notable for the following:    Potassium 2.8 (*)    Glucose, Bld 118 (*)    BUN <5 (*)    Calcium 8.8 (*)    ALT 12 (*)    All other components within normal limits  URINALYSIS, COMPLETE (UACMP) WITH MICROSCOPIC - Abnormal; Notable for the following:    Color, Urine YELLOW (*)    APPearance CLEAR (*)    Hgb urine dipstick LARGE (*)    Squamous Epithelial / LPF 0-5 (*)    All other components within normal limits  LIPASE, BLOOD  CBC  TROPONIN I  TROPONIN I   ____________________________________________  EKG  ED ECG REPORT I, Loney Hering, the attending physician, personally viewed and interpreted this ECG.   Date: 01/30/2017  EKG Time:   Rate: 72  Rhythm: normal sinus rhythm  Axis: normal  Intervals:none  ST&T Change: none  ____________________________________________  RADIOLOGY  CXR CT renal stone  ____________________________________________   PROCEDURES  Procedure(s) performed: None  Procedures  Critical Care performed: No  ____________________________________________   INITIAL IMPRESSION / ASSESSMENT AND PLAN / ED COURSE  Pertinent labs & imaging results that were available during my care of the patient were reviewed by me and considered in my medical decision making (see chart for details).  This is a 56 year old female who comes into the hospital today with multiple complaints. She had some pain in between her shoulder blades as well as some vaginal pain. She reports that the shoulder blade pain is gone but she still having this vaginal pain. The patient also told me that she had been on Premarin at some point and the pain started  after her doctor told her to get off of the Premarin. I feel that the patient's vaginal pain is due to atrophic vaginitis. She had a hysterectomy 18 years ago and has not been using any estrogen for some time. My concern with the back pain is cardiac related. The patient's chest x-ray is unremarkable and her troponins are both negative. I did for the patient some tramadol which she refused and some Zofran but she refused. The patient does have bacterial vaginosis which could be causing her pain. I will place her on  metronidazole and have her follow back up with her primary care physician. I did give the patient a dose of potassium as her potassium was 2.8. Otherwise the patient has no further complaints or concerns. She does CT scan that did not show any kidney stones or any other abnormalities. The patient will be discharged home.  Clinical Course as of Jan 30 900  Wed Jan 30, 2017  0101 No active cardiopulmonary disease. DG Chest 2 View [AW]  0105 1. No acute abnormality seen within the abdomen or pelvis. 2. Scattered aortic atherosclerosis. 3. Degenerative change along the lumbar spine, with multiple posterior disc protrusions noted, similar in appearance to 2017.   CT Renal Laren Everts [AW]    Clinical Course User Index [AW] Loney Hering, MD     ____________________________________________   FINAL CLINICAL IMPRESSION(S) / ED DIAGNOSES  Final diagnoses:  Acute midline thoracic back pain  Bacterial vaginosis  Vaginal pain      NEW MEDICATIONS STARTED DURING THIS VISIT:  Discharge Medication List as of 01/30/2017  3:08 AM    START taking these medications   Details  metroNIDAZOLE (FLAGYL) 500 MG tablet Take 1 tablet (500 mg total) by mouth 2 (two) times daily., Starting Wed 01/30/2017, Until Wed 02/06/2017, Print         Note:  This document was prepared using Dragon voice recognition software and may include unintentional dictation errors.    Loney Hering,  MD 01/30/17 [phone]

## 2017-01-30 NOTE — ED Notes (Signed)
Pt currently refusing pain medication stating, "I already take that at home I do not need it now."

## 2017-01-30 NOTE — Telephone Encounter (Signed)
Spoke w/ pt.  She reports that she was in the ED last night night b/c of pain b/t her shoulder blades. She states that heart attack was ruled out and that her sx may be r/t her stomach. She states that she is calling to ask if she missed her statin last night, if she should take it now. She states that she previously saw Dr. Vicente Males for GI, but does not want to see him again.  She has not contacted her PCP. She states that she does not know who to call. Offered pt appt this afternoon to see Dr. Rockey Situ, as we have only seen her one time, in November, and if her sx have started since her last ov, she will need to be evaluated. She states that she has been trying for 2 months to get an appt, but I do not see documentation of this. Pt states that she may just go to [facility] or Urgent Care. Pt is indecisive and after some discussion, agrees to come in today @ 2:40 to see Dr. Rockey Situ.

## 2017-01-30 NOTE — Telephone Encounter (Signed)
Pt treated in ED for BV. Pt did not have a PAP at ED, she had a wet prep. Order placed for mammo.

## 2017-01-30 NOTE — ED Notes (Signed)
EDP in rm answering questions pt has at discharge.

## 2017-01-30 NOTE — Telephone Encounter (Signed)
Pt calling stating last night she called 911 for she thought she was having a heart attack and they took her to ED She spent the night there and they told her that her heart was okay and that it was not a heart attack They said it may be coming from her stomach  But this morning she is having that same feeling.  Pains between her shoulder blades, more of a pressure.  Has had some sob when the pains come on Also she states Usually takes her Lovastatin at bed time   She is asking if she can take one now.  For she did take it last night Please advise

## 2017-01-30 NOTE — ED Notes (Signed)
Pt refused nausea medication  And verbalized "I need one of my stomach pills." RN offered to ask MD for medication but pt refused and stated she does not want medication if she is being discharged soon. MD made aware of pts request to be discharged soon. RN spoke with pt about importance of GI follow up.

## 2017-01-30 NOTE — Discharge Instructions (Signed)
Please follow up with your primary care physician.

## 2017-01-30 NOTE — ED Notes (Signed)
Patient transported to X-ray 

## 2017-01-30 NOTE — ED Notes (Signed)
Pt reporting nausea. MD made aware.

## 2017-01-30 NOTE — Telephone Encounter (Signed)
Pt states went to ED last night for severe shoulder pain. They did pap and found severe bacterial vaginal infections. Pt states both Dr Marcelline Mates and Dr Amalia Hailey said she did not need pap since she had hysto 15 yrs ago. Pt also states [facility] gave pt Premerin Oct 2017. Pt stopped it in Jan 2018 per her pcp to hold off until colonoscopy and mamm complete. Pt wants to start back premerin after thses are done.pt req order for mammogram at [facility]. Pt ph# [phone].

## 2017-01-31 ENCOUNTER — Ambulatory Visit (INDEPENDENT_AMBULATORY_CARE_PROVIDER_SITE_OTHER): Payer: Medicaid Other | Admitting: General Surgery

## 2017-01-31 ENCOUNTER — Encounter: Payer: Self-pay | Admitting: General Surgery

## 2017-01-31 VITALS — BP 130/88 | HR 82 | Resp 14 | Ht 66.0 in | Wt 140.0 lb

## 2017-01-31 DIAGNOSIS — D171 Benign lipomatous neoplasm of skin and subcutaneous tissue of trunk: Secondary | ICD-10-CM

## 2017-01-31 DIAGNOSIS — D1739 Benign lipomatous neoplasm of skin and subcutaneous tissue of other sites: Secondary | ICD-10-CM

## 2017-01-31 NOTE — Progress Notes (Signed)
Patient ID: Patricia Rollins, female   DOB: 1961/02/04, 56 y.o.   MRN: [id number]  Chief Complaint  Patient presents with  . Other    sutures    HPI Patricia Rollins is a 56 y.o. female here due to a suture she says is bothering her. She had an area excised a few months ago and reports that she has a suture that is sticking out and itches. Was recently seen in the ER and diagnosed with a bacterial infection and is currently on antibiotics.      HPI  Past Medical History:  Diagnosis Date  . Anxiety   . Anxiety   . Arthritis    patient has bilateral bursitis in hips  . Depression   . Fibromyalgia   . Hematuria 06/21/2016  . Neuromuscular disorder [facility])     Past Surgical History:  Procedure Laterality Date  . ABDOMINAL HYSTERECTOMY    . APPENDECTOMY    . OOPHORECTOMY    . TONSILLECTOMY AND ADENOIDECTOMY Bilateral     Family History  Problem Relation Age of Onset  . Ovarian cancer Mother   . Pancreatic cancer Mother   . Bladder Cancer Neg Hx   . Kidney cancer Neg Hx   . Prostate cancer Neg Hx     Social History Social History  Substance Use Topics  . Smoking status: Current Every Day Smoker    Packs/day: 0.25    Types: Cigarettes  . Smokeless tobacco: Never Used  . Alcohol use No    Allergies  Allergen Reactions  . Prozac [Fluoxetine] Other (See Comments)    Confusion  . Sulfa Antibiotics Nausea Only    Current Outpatient Prescriptions  Medication Sig Dispense Refill  . ALPRAZolam (XANAX) 1 MG tablet Take 1 tablet (1 mg total) by mouth 4 (four) times daily. 84 tablet 0  . aspirin 325 MG tablet Take 325 mg by mouth daily.    Marland Kitchen estrogens, conjugated, (PREMARIN) 0.3 MG tablet Take 1 tablet (0.3 mg total) by mouth daily. 30 tablet 11  . HYDROcodone-acetaminophen (NORCO) 7.5-325 MG tablet Take 1 tablet by mouth every 8 (eight) hours as needed for moderate pain. 90 tablet 0  . lactulose (CEPHULAC) 10 g packet Take 1 packet (10 g total) by mouth 3 (three) times daily.  30 each 0  . metaxalone (SKELAXIN) 800 MG tablet Take 1 tablet (800 mg total) by mouth at bedtime as needed for muscle spasms. 30 tablet 0  . metroNIDAZOLE (FLAGYL) 500 MG tablet Take 1 tablet (500 mg total) by mouth 2 (two) times daily. 14 tablet 0  . promethazine (PHENERGAN) 12.5 MG tablet Take 1 tablet (12.5 mg total) by mouth every 6 (six) hours as needed for nausea or vomiting. 12 tablet 0  . sucralfate (CARAFATE) 1 g tablet Take 1 tablet (1 g total) by mouth 4 (four) times daily -  with meals and at bedtime. 90 tablet 2   No current facility-administered medications for this visit.     Review of Systems Review of Systems  Constitutional: Negative.   Cardiovascular: Negative.     Blood pressure 130/88, pulse 82, resp. rate 14, height 5\' 6"  (1.676 m), weight 140 lb (63.5 kg).  Physical Exam Physical Exam  Pulmonary/Chest:         Assessment    Retained suture material status post lipoma excision, removed.    Plan    The patient reports she was scheduled for a colonoscopy with Dr.Anna in January, but the prep results  of the nausea and vomiting and the procedure was not completed. She reports having been referred to [facility] who has been working on her evaluation. Endoscopy has not yet been completed. She questioned whether she could have her endoscopy completed locally. I asked her to review this with her primary physician, and if he approves we'll review her chart to see if our office can be of service.    This has been scribed by Lesly Rubenstein LPN    Robert Bellow 01/31/2017, 9:13 PM

## 2017-02-01 ENCOUNTER — Encounter: Payer: Self-pay | Admitting: Family Medicine

## 2017-02-01 ENCOUNTER — Telehealth: Payer: Self-pay | Admitting: Family Medicine

## 2017-02-01 ENCOUNTER — Ambulatory Visit (INDEPENDENT_AMBULATORY_CARE_PROVIDER_SITE_OTHER): Payer: Medicaid Other | Admitting: Family Medicine

## 2017-02-01 VITALS — BP 140/96 | HR 91 | Temp 98.3°F | Resp 16 | Ht 66.0 in | Wt 142.1 lb

## 2017-02-01 DIAGNOSIS — M546 Pain in thoracic spine: Secondary | ICD-10-CM

## 2017-02-01 DIAGNOSIS — K5900 Constipation, unspecified: Secondary | ICD-10-CM

## 2017-02-01 NOTE — Telephone Encounter (Signed)
Pt states she is willing to try the 25mg  Lyrica

## 2017-02-01 NOTE — Progress Notes (Signed)
Name: Patricia Rollins   MRN: [id number]    DOB: 1961-08-06   Date:02/01/2017       Progress Note  Subjective  Chief Complaint  Chief Complaint  Patient presents with  . Follow-up    ER visit   . Abdominal Pain    referral to Dr. Bary Castilla    HPI  Patient presents for obtaining referral to Dr. Bary Castilla to obtain endoscopy and colonoscopy, has seen GI at [facility], Dr. Vicente Males in [place] and has been recommended to have a colonoscopy along with other tests ordered by Dr. Denice Paradise to evaluate her abdominal pain.  As such she has been to the [facility] ER for an episode of intense pressure over the upper shoulders, had a full cardiac work up including EKG and troponin along with CXR, which were unremarkable. She is scheduled to see Cardiology Dr. Ubaldo Glassing for a stress test.    Past Medical History:  Diagnosis Date  . Anxiety   . Anxiety   . Arthritis    patient has bilateral bursitis in hips  . Depression   . Fibromyalgia   . Hematuria 06/21/2016  . Neuromuscular disorder [facility])     Past Surgical History:  Procedure Laterality Date  . ABDOMINAL HYSTERECTOMY    . APPENDECTOMY    . OOPHORECTOMY    . TONSILLECTOMY AND ADENOIDECTOMY Bilateral     Family History  Problem Relation Age of Onset  . Ovarian cancer Mother   . Pancreatic cancer Mother   . Bladder Cancer Neg Hx   . Kidney cancer Neg Hx   . Prostate cancer Neg Hx     Social History   Social History  . Marital status: Single    Spouse name: N/A  . Number of children: N/A  . Years of education: N/A   Occupational History  . Not on file.   Social History Main Topics  . Smoking status: Current Every Day Smoker    Packs/day: 0.25    Types: Cigarettes  . Smokeless tobacco: Never Used  . Alcohol use No  . Drug use: No  . Sexual activity: No   Other Topics Concern  . Not on file   Social History Narrative  . No narrative on file     Current Outpatient Prescriptions:  .  ALPRAZolam (XANAX) 1 MG tablet, Take 1 tablet  (1 mg total) by mouth 4 (four) times daily., Disp: 84 tablet, Rfl: 0 .  aspirin 325 MG tablet, Take 325 mg by mouth daily., Disp: , Rfl:  .  estrogens, conjugated, (PREMARIN) 0.3 MG tablet, Take 1 tablet (0.3 mg total) by mouth daily., Disp: 30 tablet, Rfl: 11 .  HYDROcodone-acetaminophen (NORCO) 7.5-325 MG tablet, Take 1 tablet by mouth every 8 (eight) hours as needed for moderate pain., Disp: 90 tablet, Rfl: 0 .  lactulose (CEPHULAC) 10 g packet, Take 1 packet (10 g total) by mouth 3 (three) times daily., Disp: 30 each, Rfl: 0 .  metaxalone (SKELAXIN) 800 MG tablet, Take 1 tablet (800 mg total) by mouth at bedtime as needed for muscle spasms., Disp: 30 tablet, Rfl: 0 .  metroNIDAZOLE (FLAGYL) 500 MG tablet, Take 1 tablet (500 mg total) by mouth 2 (two) times daily., Disp: 14 tablet, Rfl: 0 .  promethazine (PHENERGAN) 12.5 MG tablet, Take 1 tablet (12.5 mg total) by mouth every 6 (six) hours as needed for nausea or vomiting., Disp: 12 tablet, Rfl: 0 .  sucralfate (CARAFATE) 1 g tablet, Take 1 tablet (1 g total) by mouth  4 (four) times daily -  with meals and at bedtime., Disp: 90 tablet, Rfl: 2  Allergies  Allergen Reactions  . Prozac [Fluoxetine] Other (See Comments)    Confusion  . Sulfa Antibiotics Nausea Only     ROS  Please see history of present illness for complete discussion of ROS  Objective  Vitals:   02/01/17 1131  BP: (!) 140/96  Pulse: 91  Resp: 16  Temp: 98.3 F (36.8 C)  TempSrc: Oral  SpO2: 94%  Weight: 142 lb 1.6 oz (64.5 kg)  Height: 5\' 6"  (1.676 m)    Physical Exam  Constitutional: She is well-developed, well-nourished, and in no distress.  HENT:  Head: Normocephalic and atraumatic.  Cardiovascular: Normal rate, regular rhythm and normal heart sounds.   No murmur heard. Pulmonary/Chest: Effort normal and breath sounds normal. She has no wheezes.  Abdominal: Soft. Bowel sounds are normal. There is no tenderness.  Musculoskeletal:       Cervical back:  She exhibits tenderness.       Back:  Mild stiffness and tenderness over the left upper back.  Psychiatric: Mood, memory, affect and judgment normal.  Nursing note and vitals reviewed.        Assessment & Plan  1. Acute midline thoracic back pain  ER notes reviewed, agree with cardiology assessment to rule out CMV disease, referral to Dr. Nehemiah Massed provided - Ambulatory referral to Cardiology  2. Constipation, unspecified constipation type Advised to DC Miralax and take Lactulose as prescribed by Dr. Vicente Males in gastroenterology, patient will be referred to general surgery Dr. Bary Castilla, who is willing to do EGD and colonoscopy to rule out any GI pathology. - Ambulatory referral to General Surgery   Loyal Holzheimer [facility] 02/01/2017 11:42 AM

## 2017-02-01 NOTE — Telephone Encounter (Signed)
I'm happy to start her on Lyrica for fibromyalgia after she is cleared by cardiology. She can schedule an appointment in the next 2 weeks

## 2017-02-04 ENCOUNTER — Ambulatory Visit (INDEPENDENT_AMBULATORY_CARE_PROVIDER_SITE_OTHER): Payer: Medicaid Other | Admitting: Obstetrics and Gynecology

## 2017-02-04 ENCOUNTER — Encounter: Payer: Self-pay | Admitting: Obstetrics and Gynecology

## 2017-02-04 VITALS — BP 118/80 | HR 99 | Wt 141.2 lb

## 2017-02-04 DIAGNOSIS — N898 Other specified noninflammatory disorders of vagina: Secondary | ICD-10-CM

## 2017-02-04 MED ORDER — ESTRADIOL 0.1 MG/GM VA CREA: 0.2500 | TOPICAL_CREAM | VAGINAL | 3 refills | Status: DC

## 2017-02-04 NOTE — Telephone Encounter (Signed)
Patient notified

## 2017-02-04 NOTE — Progress Notes (Addendum)
HPI:      [name] is a 56 y.o. [phone] who LMP was No LMP recorded. Patient has had a hysterectomy.  Subjective:   She presents today To again discuss her use of estrogen. She states that since she last saw me in February she has talked with numerous nurses on the phone and was seen in the emergency department. She says that she has come to the understanding that with her heart blockages and the need for possible cardiac surgery she should discontinue oral estrogen. She has decided that she will use vaginal estrogen instead. She has made this very clear to me. Her emergency room visit for right shoulder pain ended up being "a vaginal infection" for which she was prescribed antibiotics. These antibiotics have also cleared of her stomach pain. She states that she was originally prescribed oral estrogen for hot flashes.    Hx: The following portions of the patient's history were reviewed and updated as appropriate:              She  has a past medical history of Anxiety; Anxiety; Arthritis; Depression; Fibromyalgia; Hematuria (06/21/2016); and Neuromuscular disorder ([place]). She  does not have any pertinent problems on file. She  has a past surgical history that includes Abdominal hysterectomy; Tonsillectomy and adenoidectomy (Bilateral); Oophorectomy; and Appendectomy. Her family history includes Ovarian cancer in her mother; Pancreatic cancer in her mother. She  reports that she has been smoking Cigarettes.  She has been smoking about 0.25 packs per day. She has never used smokeless tobacco. She reports that she does not drink alcohol or use drugs. Current Outpatient Prescriptions on File Prior to Visit  Medication Sig Dispense Refill  . ALPRAZolam (XANAX) 1 MG tablet Take 1 tablet (1 mg total) by mouth 4 (four) times daily. 84 tablet 0  . aspirin 325 MG tablet Take 325 mg by mouth daily.    . metaxalone (SKELAXIN) 800 MG tablet Take 1 tablet (800 mg total) by mouth at bedtime as needed  for muscle spasms. 30 tablet 0  . metroNIDAZOLE (FLAGYL) 500 MG tablet Take 1 tablet (500 mg total) by mouth 2 (two) times daily. 14 tablet 0  . promethazine (PHENERGAN) 12.5 MG tablet Take 1 tablet (12.5 mg total) by mouth every 6 (six) hours as needed for nausea or vomiting. 12 tablet 0  . sucralfate (CARAFATE) 1 g tablet Take 1 tablet (1 g total) by mouth 4 (four) times daily -  with meals and at bedtime. 90 tablet 2  . HYDROcodone-acetaminophen (NORCO) 7.5-325 MG tablet Take 1 tablet by mouth every 8 (eight) hours as needed for moderate pain. (Patient not taking: Reported on 02/04/2017) 90 tablet 0  . lactulose (CEPHULAC) 10 g packet Take 1 packet (10 g total) by mouth 3 (three) times daily. (Patient not taking: Reported on 02/04/2017) 30 each 0   No current facility-administered medications on file prior to visit.          Review of Systems:  Review of Systems  Constitutional: Denied constitutional symptoms, night sweats, recent illness, fatigue, fever, insomnia and weight loss.  Eyes: Denied eye symptoms, eye pain, photophobia, vision change and visual disturbance.  Ears/Nose/Throat/Neck: Denied ear, nose, throat or neck symptoms, hearing loss, nasal discharge, sinus congestion and sore throat.  Cardiovascular: Denied cardiovascular symptoms, arrhythmia, chest pain/pressure, edema, exercise intolerance, orthopnea and palpitations.  Respiratory: Denied pulmonary symptoms, asthma, pleuritic pain, productive sputum, cough, dyspnea and wheezing.  Gastrointestinal: Denied, gastro-esophageal reflux, melena, nausea and vomiting.  Genitourinary: Denied genitourinary symptoms including symptomatic vaginal discharge, pelvic relaxation issues, and urinary complaints.  Musculoskeletal: Denied musculoskeletal symptoms, stiffness, swelling, muscle weakness and myalgia.  Dermatologic: Denied dermatology symptoms, rash and scar.  Neurologic: Denied neurology symptoms, dizziness, headache, neck pain and  syncope.  Psychiatric: Denied psychiatric symptoms, anxiety and depression.  Endocrine: Denied endocrine symptoms including hot flashes and night sweats.   Meds:   Current Outpatient Prescriptions on File Prior to Visit  Medication Sig Dispense Refill  . ALPRAZolam (XANAX) 1 MG tablet Take 1 tablet (1 mg total) by mouth 4 (four) times daily. 84 tablet 0  . aspirin 325 MG tablet Take 325 mg by mouth daily.    . metaxalone (SKELAXIN) 800 MG tablet Take 1 tablet (800 mg total) by mouth at bedtime as needed for muscle spasms. 30 tablet 0  . metroNIDAZOLE (FLAGYL) 500 MG tablet Take 1 tablet (500 mg total) by mouth 2 (two) times daily. 14 tablet 0  . promethazine (PHENERGAN) 12.5 MG tablet Take 1 tablet (12.5 mg total) by mouth every 6 (six) hours as needed for nausea or vomiting. 12 tablet 0  . sucralfate (CARAFATE) 1 g tablet Take 1 tablet (1 g total) by mouth 4 (four) times daily -  with meals and at bedtime. 90 tablet 2  . HYDROcodone-acetaminophen (NORCO) 7.5-325 MG tablet Take 1 tablet by mouth every 8 (eight) hours as needed for moderate pain. (Patient not taking: Reported on 02/04/2017) 90 tablet 0  . lactulose (CEPHULAC) 10 g packet Take 1 packet (10 g total) by mouth 3 (three) times daily. (Patient not taking: Reported on 02/04/2017) 30 each 0   No current facility-administered medications on file prior to visit.     Objective:     Vitals:   02/04/17 1531  BP: 118/80  Pulse: 99                Assessment:    [id number] Patient Active Problem List   Diagnosis Date Noted  . Midline thoracic back pain 02/01/2017  . LUQ abdominal tenderness 01/03/2017  . Renal lesion 01/03/2017  . Spinal stenosis of lumbar region 12/25/2016  . Epigastric abdominal pain 11/21/2016  . Constipation 10/23/2016  . Lipoma of chest wall 10/23/2016  . Coronary artery disease involving coronary bypass graft of native heart without angina pectoris 09/13/2016  . Aortic atherosclerosis ([place]) 09/13/2016  .  PAD (peripheral artery disease) ([place]) 09/13/2016  . Pure hypercholesterolemia 09/13/2016  . Marijuana use 08/09/2016  . Chest pain of uncertain etiology 16/08/9603  . Chronic lower extremity pain (Bilateral) (L>R) 07/26/2016  . Chronic upper extremity pain (Bilateral) (L>R) 07/26/2016  . Smoker 07/25/2016  . Cigarette nicotine dependence 07/25/2016  . Lumbar facet syndrome (Bilateral) (R>L) 07/25/2016  . Chronic sacroiliac joint pain (Bilateral) (R>L) 07/25/2016  . Chronic shoulder pain (Left) 07/25/2016  . Peripheral neuropathy (HCC) (lower extremity) (Bilateral) (L>R) 07/25/2016  . Neurogenic pain 07/25/2016  . Neuropathic pain 07/25/2016  . Musculoskeletal pain 07/25/2016  . Chronic neck pain (Bilateral) (L>R) 07/25/2016  . Long term current use of opiate analgesic 07/24/2016  . Long term prescription opiate use 07/24/2016  . Opiate use 07/24/2016  . Encounter for therapeutic drug level monitoring 07/24/2016  . Encounter for pain management planning 07/24/2016  . Long term prescription benzodiazepine use 07/24/2016  . Chronic hip pain (Location of Primary Source of Pain) (Bilateral) (R>L) 07/24/2016  . Annual physical exam 07/18/2016  . Colon cancer screening 07/18/2016  . Tongue lesion 06/21/2016  . Abnormal mammogram  11/08/2015  . Trochanteric bursitis (Bilateral) 08/16/2015  . Hot flash, menopausal 08/16/2015  . Compulsive tobacco user syndrome 08/16/2015  . Chronic low back pain (Location of Secondary source of pain) (Bilateral) (R>L) 08/15/2015  . CD (contact dermatitis) 08/15/2015  . Menopausal and perimenopausal disorder 08/15/2015  . Chronic pain 08/15/2015  . Menopausal symptom 08/15/2015  . Chronic lumbar pain 05/12/2015  . Fibromyalgia 05/12/2015  . Generalized anxiety disorder 05/12/2015  . Chronic pain disorder 07/12/2010  . Lumbosacral neuritis 02/23/2010     1. Vaginal dryness     Patient would like to discontinue oral estrogen and begin use of vaginal  estrogen.  The ED doctor convinced her of this. This is despite the fact that I advised her regarding discontinued use of oral estrogen at her last visit.   Plan:            1.  I have explained that vaginal estrogen will be good for her vaginal health but that very little of it is systemic and will likely not help if her main problem is hot flashes. I have informed her that she should discontinue the oral estrogen when she begins the vaginal estrogen. I have also suggested that she speak with her cardiologist regarding the use of oral estrogens and her safety in regard to her vascular health.   2.  The patient states that when she called for an appointment here she requests Dr. Marcelline Mates but for some reason does not get her. She is interested in following up with Dr. Marcelline Mates in the future.     Meds ordered this encounter  Medications  . estradiol (ESTRACE) 0.1 MG/GM vaginal cream    Sig: Place 7.61 Applicatorfuls vaginally 2 (two) times a week.    Dispense:  90 g    Refill:  3        F/U  Return in about 3 months (around 05/07/2017). I spent 17 minutes with this patient of which greater than 50% was spent discussing HRT risks and benefits as well as changing doses and routes of use.  Finis Bud, M.D. 02/04/2017 4:10 PM

## 2017-02-05 ENCOUNTER — Ambulatory Visit: Payer: Medicaid Other | Admitting: Cardiovascular Disease

## 2017-02-05 ENCOUNTER — Telehealth: Payer: Self-pay | Admitting: Obstetrics and Gynecology

## 2017-02-05 DIAGNOSIS — N952 Postmenopausal atrophic vaginitis: Secondary | ICD-10-CM

## 2017-02-05 NOTE — Telephone Encounter (Signed)
Please advise 

## 2017-02-05 NOTE — Telephone Encounter (Signed)
Patient called stating the wrong cream was sent in. Dr Amalia Hailey stated he is no longer seeing this patient due to non compliance.

## 2017-02-06 NOTE — Telephone Encounter (Signed)
Called pt no answer, LM for pt to call back and let us know what cream she prefers.

## 2017-02-06 NOTE — Telephone Encounter (Signed)
Well there are only 2 estrogen vaginal creams in production, so if she does not want Estrace, then verify that Premarin cream is the one she wants and prescribe for use twice weekly (1/2 gram), 3 refills.

## 2017-02-07 ENCOUNTER — Telehealth: Payer: Self-pay | Admitting: Family Medicine

## 2017-02-07 MED ORDER — ESTROGENS, CONJUGATED 0.625 MG/GM VA CREA
TOPICAL_CREAM | VAGINAL | 3 refills | Status: DC
Start: 2017-02-07 — End: 2017-03-28

## 2017-02-07 NOTE — Telephone Encounter (Signed)
RX sent in

## 2017-02-07 NOTE — Telephone Encounter (Signed)
PT SAID THAT YOU AND HER HAD TALKED ABOUT YOU CALLING HER SOMETHING IN TO QUIT SMOKING AND THE PHARM HAS SENT YOU OVER INFORMATION ABOUT THIS . SHE NEEDS YOU TO TAKE A LOOK AT THIS AND PRESCRIBE HER SOMETHING. PT SAID THAT SHE DID SEE THE CARDIOLOGY YESTERDAY AND EVERYTHING WAS GOOD. SHE IS TO GET A STRESS TEST AND ECHO IN April 2018 JUST TO MAKE SURE.

## 2017-02-07 NOTE — Telephone Encounter (Signed)
Patient called back and states she wants the Premarin cream called in to her pharmacy. Her pharmacy is [facility] . Please advise. Thank you

## 2017-02-08 NOTE — Telephone Encounter (Signed)
We have different options to help her quit smoking, and I have not received any information from the pharmacy as yet. I will be happy to look over months that information is received.

## 2017-02-13 ENCOUNTER — Ambulatory Visit: Payer: Medicaid Other | Admitting: Family Medicine

## 2017-02-14 ENCOUNTER — Ambulatory Visit (INDEPENDENT_AMBULATORY_CARE_PROVIDER_SITE_OTHER): Payer: Medicaid Other | Admitting: Family Medicine

## 2017-02-14 ENCOUNTER — Encounter: Payer: Self-pay | Admitting: Family Medicine

## 2017-02-14 VITALS — BP 118/78 | HR 105 | Temp 98.3°F | Resp 18 | Ht 66.0 in | Wt 140.2 lb

## 2017-02-14 DIAGNOSIS — M797 Fibromyalgia: Secondary | ICD-10-CM | POA: Diagnosis not present

## 2017-02-14 DIAGNOSIS — Z720 Tobacco use: Secondary | ICD-10-CM

## 2017-02-14 MED ORDER — NICOTINE 10 MG IN INHA: RESPIRATORY_TRACT | 0 refills | Status: DC

## 2017-02-14 MED ORDER — PREGABALIN 25 MG PO CAPS: 25.0000 mg | ORAL_CAPSULE | Freq: Every day | ORAL | 0 refills | Status: DC

## 2017-02-14 NOTE — Progress Notes (Signed)
Name: Patricia Rollins   MRN: [id number]    DOB: Feb 21, 1961   Date:02/14/2017       Progress Note  Subjective  Chief Complaint  Chief Complaint  Patient presents with  . Follow-up    Nicotene replacement therapy    Nicotine Dependence  Presents for initial visit. Symptoms include cravings and fatigue. Preferred tobacco types include cigarettes. Preferred cigarette types include filtered. Preferred strength is regular. Preferred cigarettes are non-menthol. Cigarette brand: Misty blues. Her urge triggers include company of smokers and stress. Her first smoke is before 6 AM. She smokes 1 pack of cigarettes per day. She started smoking when she was >17 (started smoking in her mid 20s.) years old. Past treatments include buproprion, nicotine patch and varenicline. The treatment provided no relief. Dreana is ready to quit.     Past Medical History:  Diagnosis Date  . Anxiety   . Anxiety   . Arthritis    patient has bilateral bursitis in hips  . Depression   . Fibromyalgia   . Hematuria 06/21/2016  . Neuromuscular disorder [facility])     Past Surgical History:  Procedure Laterality Date  . ABDOMINAL HYSTERECTOMY    . APPENDECTOMY    . OOPHORECTOMY    . TONSILLECTOMY AND ADENOIDECTOMY Bilateral     Family History  Problem Relation Age of Onset  . Ovarian cancer Mother   . Pancreatic cancer Mother   . Bladder Cancer Neg Hx   . Kidney cancer Neg Hx   . Prostate cancer Neg Hx     Social History   Social History  . Marital status: Single    Spouse name: N/A  . Number of children: N/A  . Years of education: N/A   Occupational History  . Not on file.   Social History Main Topics  . Smoking status: Current Every Day Smoker    Packs/day: 0.25    Types: Cigarettes  . Smokeless tobacco: Never Used  . Alcohol use No  . Drug use: No  . Sexual activity: No   Other Topics Concern  . Not on file   Social History Narrative  . No narrative on file     Current Outpatient  Prescriptions:  .  ALPRAZolam (XANAX) 1 MG tablet, Take 1 tablet (1 mg total) by mouth 4 (four) times daily., Disp: 84 tablet, Rfl: 0 .  aspirin 325 MG tablet, Take 325 mg by mouth daily., Disp: , Rfl:  .  conjugated estrogens (PREMARIN) vaginal cream, Use 1/2 gram twice weekly, Disp: 42.5 g, Rfl: 3 .  HYDROcodone-acetaminophen (NORCO) 7.5-325 MG tablet, Take 1 tablet by mouth every 8 (eight) hours as needed for moderate pain., Disp: 90 tablet, Rfl: 0 .  lactulose (CEPHULAC) 10 g packet, Take 1 packet (10 g total) by mouth 3 (three) times daily., Disp: 30 each, Rfl: 0 .  metaxalone (SKELAXIN) 800 MG tablet, Take 1 tablet (800 mg total) by mouth at bedtime as needed for muscle spasms., Disp: 30 tablet, Rfl: 0 .  promethazine (PHENERGAN) 12.5 MG tablet, Take 1 tablet (12.5 mg total) by mouth every 6 (six) hours as needed for nausea or vomiting., Disp: 12 tablet, Rfl: 0 .  sucralfate (CARAFATE) 1 g tablet, Take 1 tablet (1 g total) by mouth 4 (four) times daily -  with meals and at bedtime., Disp: 90 tablet, Rfl: 2  Allergies  Allergen Reactions  . Prozac [Fluoxetine] Other (See Comments)    Confusion  . Sulfa Antibiotics Nausea Only  Review of Systems  Constitutional: Positive for fatigue.      Objective  Vitals:   02/14/17 1553  BP: 118/78  Pulse: (!) 105  Resp: 18  Temp: 98.3 F (36.8 C)  TempSrc: Oral  SpO2: 96%  Weight: 140 lb 3.2 oz (63.6 kg)  Height: 5\' 6"  (1.676 m)    Physical Exam  Constitutional: She is oriented to person, place, and time and well-developed, well-nourished, and in no distress.  HENT:  Head: Normocephalic and atraumatic.  Cardiovascular: Normal rate, regular rhythm and normal heart sounds.   No murmur heard. Pulmonary/Chest: Effort normal and breath sounds normal. She has no wheezes.  Neurological: She is alert and oriented to person, place, and time.  Psychiatric: Mood, memory, affect and judgment normal.  Nursing note and vitals  reviewed.      Assessment & Plan  1. Tobacco abuse Started on Nicotrol inhaler to be used for smoking cessation. Advised to inhaler more than 10 cartridges per day, patient to quit smoking at the onset of therapy, reassess in 6 weeks to taper off - nicotine (NICOTROL) 10 MG inhaler; 10 cartridges/day maximum, use frequent  puffing x 20 mins for each cartridge  Dispense: 420 each; Refill: 0  2. Fibromyalgia Patient is no longer an opioid therapy for fibromyalgia, she wishes to try a low-dose of Lyrica for symptoms of chronic generalized musculoskeletal pain, we'll start at 25 mg daily, follow-up in 3 months. -pregabalin (LYRICA) 25 MG capsule; Take 1 capsule (25 mg total) by mouth daily.  Dispense: 90 capsule; Refill: 0   Danyel Griess [facility] 02/14/2017 4:12 PM

## 2017-02-19 ENCOUNTER — Ambulatory Visit: Payer: Medicaid Other | Admitting: Family Medicine

## 2017-02-26 ENCOUNTER — Ambulatory Visit (INDEPENDENT_AMBULATORY_CARE_PROVIDER_SITE_OTHER): Payer: Medicaid Other | Admitting: Vascular Surgery

## 2017-02-26 ENCOUNTER — Encounter (INDEPENDENT_AMBULATORY_CARE_PROVIDER_SITE_OTHER): Payer: Self-pay | Admitting: Vascular Surgery

## 2017-02-26 ENCOUNTER — Ambulatory Visit (INDEPENDENT_AMBULATORY_CARE_PROVIDER_SITE_OTHER): Payer: Medicaid Other

## 2017-02-26 ENCOUNTER — Other Ambulatory Visit (INDEPENDENT_AMBULATORY_CARE_PROVIDER_SITE_OTHER): Payer: Self-pay | Admitting: Nephrology

## 2017-02-26 VITALS — BP 115/85 | HR 66 | Resp 16 | Ht 64.0 in | Wt 144.0 lb

## 2017-02-26 DIAGNOSIS — I10 Essential (primary) hypertension: Secondary | ICD-10-CM

## 2017-02-26 DIAGNOSIS — R1084 Generalized abdominal pain: Secondary | ICD-10-CM

## 2017-02-26 DIAGNOSIS — R109 Unspecified abdominal pain: Secondary | ICD-10-CM | POA: Insufficient documentation

## 2017-02-26 DIAGNOSIS — G629 Polyneuropathy, unspecified: Secondary | ICD-10-CM | POA: Diagnosis not present

## 2017-02-26 DIAGNOSIS — F17200 Nicotine dependence, unspecified, uncomplicated: Secondary | ICD-10-CM

## 2017-02-26 DIAGNOSIS — F17210 Nicotine dependence, cigarettes, uncomplicated: Secondary | ICD-10-CM

## 2017-02-26 DIAGNOSIS — N280 Ischemia and infarction of kidney: Secondary | ICD-10-CM | POA: Diagnosis not present

## 2017-02-26 NOTE — Assessment & Plan Note (Signed)
The patient has generalized abdominal pain that is worse after eating with atherosclerotic risk factors including ongoing tobacco use. Given this, it is certainly reasonable to assess her for chronic mesenteric ischemia. Her symptoms are not classic for chronic mesenteric ischemia, but that is certainly a possibility. We can perform a mesenteric duplex to evaluate this. I would recommend she go ahead and get her endoscopy done first as that is more likely to be revealing.

## 2017-02-26 NOTE — Progress Notes (Signed)
Patient ID: Patricia Rollins, female   DOB: 1961/04/04, 56 y.o.   MRN: [id number]  Chief Complaint  Patient presents with  . New Evaluation    Renal ultrasound follow up    HPI Patricia Rollins is a 56 y.o. female.  I am asked to see the patient by Dr. Holley Raring for evaluation of renal infarct and abdominal pain.  The patient reports Months of postprandial abdominal pain with no clear inciting event or cause. She says it happens after most meals and she stops eating to relieve the pain. She has not had significant weight loss. She has a previous history of a small left renal infarct for which we performed a renal artery duplex today. She has normal renal artery velocities and flow characteristics bilaterally with normal renal sizes bilaterally. No hemodynamically significant stenosis or cause of the infarct was identified. As far she knows, her renal function has remained okay. She has atherosclerotic risk factors including ongoing tobacco use. She denies fever or chills.   Past Medical History:  Diagnosis Date  . Anxiety   . Anxiety   . Arthritis    patient has bilateral bursitis in hips  . Depression   . Fibromyalgia   . Hematuria 06/21/2016  . Neuromuscular disorder [facility])     Past Surgical History:  Procedure Laterality Date  . ABDOMINAL HYSTERECTOMY    . APPENDECTOMY    . OOPHORECTOMY    . TONSILLECTOMY AND ADENOIDECTOMY Bilateral     Family History  Problem Relation Age of Onset  . Ovarian cancer Mother   . Pancreatic cancer Mother   . Bladder Cancer Neg Hx   . Kidney cancer Neg Hx   . Prostate cancer Neg Hx   No bleeding or clotting disorders  Social History Social History  Substance Use Topics  . Smoking status: Current Every Day Smoker    Packs/day: 0.25    Types: Cigarettes  . Smokeless tobacco: Never Used  . Alcohol use No  uses marijuana  Allergies  Allergen Reactions  . Prozac [Fluoxetine] Other (See Comments)    Confusion  . Sulfa Antibiotics Nausea Only     Current Outpatient Prescriptions  Medication Sig Dispense Refill  . ALPRAZolam (XANAX) 1 MG tablet Take 1 tablet (1 mg total) by mouth 4 (four) times daily. 84 tablet 0  . aspirin 325 MG tablet Take 325 mg by mouth daily.    Marland Kitchen conjugated estrogens (PREMARIN) vaginal cream Use 1/2 gram twice weekly 42.5 g 3  . lactulose (CEPHULAC) 10 g packet Take 1 packet (10 g total) by mouth 3 (three) times daily. 30 each 0  . metaxalone (SKELAXIN) 800 MG tablet Take 1 tablet (800 mg total) by mouth at bedtime as needed for muscle spasms. 30 tablet 0  . nicotine (NICOTROL) 10 MG inhaler 10 cartridges/day maximum, use frequent  puffing x 20 mins for each cartridge 420 each 0  . pregabalin (LYRICA) 25 MG capsule Take 1 capsule (25 mg total) by mouth daily. 90 capsule 0  . promethazine (PHENERGAN) 12.5 MG tablet Take 1 tablet (12.5 mg total) by mouth every 6 (six) hours as needed for nausea or vomiting. 12 tablet 0  . sucralfate (CARAFATE) 1 g tablet Take 1 tablet (1 g total) by mouth 4 (four) times daily -  with meals and at bedtime. 90 tablet 2   No current facility-administered medications for this visit.       REVIEW OF SYSTEMS (Negative unless checked)  Constitutional: '[]'$ Weight  loss  '[]'$ Fever  '[]'$ Chills Cardiac: '[]'$ Chest pain   '[]'$ Chest pressure   '[]'$ Palpitations   '[]'$ Shortness of breath when laying flat   '[]'$ Shortness of breath at rest   '[]'$ Shortness of breath with exertion. Vascular:  '[]'$ Pain in legs with walking   '[]'$ Pain in legs at rest   '[]'$ Pain in legs when laying flat   '[]'$ Claudication   '[]'$ Pain in feet when walking  '[]'$ Pain in feet at rest  '[]'$ Pain in feet when laying flat   '[]'$ History of DVT   '[]'$ Phlebitis   '[]'$ Swelling in legs   '[]'$ Varicose veins   '[]'$ Non-healing ulcers Pulmonary:   '[]'$ Uses home oxygen   '[]'$ Productive cough   '[]'$ Hemoptysis   '[]'$ Wheeze  '[]'$ COPD   '[]'$ Asthma Neurologic:  '[]'$ Dizziness  '[]'$ Blackouts   '[]'$ Seizures   '[]'$ History of stroke   '[]'$ History of TIA  '[]'$ Aphasia   '[]'$ Temporary blindness   '[]'$ Dysphagia    '[]'$ Weakness or numbness in arms   '[]'$ Weakness or numbness in legs Musculoskeletal:  '[]'$ Arthritis   '[]'$ Joint swelling   '[]'$ Joint pain   '[]'$ Low back pain Hematologic:  '[]'$ Easy bruising  '[]'$ Easy bleeding   '[]'$ Hypercoagulable state   '[]'$ Anemic  '[]'$ Hepatitis Gastrointestinal:  '[]'$ Blood in stool   '[]'$ Vomiting blood  '[x]'$ Gastroesophageal reflux/heartburn   '[x]'$ Abdominal pain Genitourinary:  '[]'$ Chronic kidney disease   '[]'$ Difficult urination  '[]'$ Frequent urination  '[]'$ Burning with urination   '[x]'$ Hematuria Skin:  '[]'$ Rashes   '[]'$ Ulcers   '[]'$ Wounds Psychological:  '[]'$ History of anxiety   '[]'$  History of major depression.    Physical Exam BP 115/85 (BP Location: Right Arm)   Pulse 66   Resp 16   Ht '5\' 4"'$  (1.626 m)   Wt 65.3 kg (144 lb)   BMI 24.72 kg/m  Gen:  WD/WN, NAD. Appears older than stated age. Head: [place]/AT, No temporalis wasting.  Ear/Nose/Throat: Hearing grossly intact, nares w/o erythema or drainage, oropharynx w/o Erythema/Exudate Eyes: Conjunctiva clear, sclera non-icteric  Neck: trachea midline.  No bruit or JVD.  Pulmonary:  Good air movement, clear to auscultation bilaterally.  Cardiac: RRR, normal S1, S2, no Murmurs, rubs or gallops. Vascular:  Vessel Right Left  Radial Palpable Palpable  Ulnar Palpable Palpable  Brachial Palpable Palpable  Carotid Palpable, without bruit Palpable, without bruit  Aorta Not palpable N/A  Femoral Palpable Palpable  Popliteal Palpable Palpable  PT Palpable Palpable  DP Palpable 1+ Palpable   Gastrointestinal: soft, non-tender/non-distended.  Musculoskeletal: M/S 5/5 throughout.  Extremities without ischemic changes.  No deformity or atrophy.  Neurologic: Sensation grossly intact in extremities.  Symmetrical.  Speech is fluent. Motor exam as listed above. Psychiatric: Judgment intact, Mood & affect appropriate for pt's clinical situation. Dermatologic: No rashes or ulcers noted.  No cellulitis or open wounds.    Radiology Dg Chest 2 View  Result Date:  01/30/2017 CLINICAL DATA:  Back pain and pelvic pain EXAM: CHEST  2 VIEW COMPARISON:  Chest CT 07/31/2016 FINDINGS: The heart size and mediastinal contours are within normal limits. Both lungs are clear. 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 IMPRESSION: No active cardiopulmonary disease. Electronically Signed   By: Ulyses Jarred M.D.   On: 01/30/2017 00:54   Ct Renal Stone Study  Result Date: 01/30/2017 CLINICAL DATA:  Chronic worsening lower abdominal pain and vaginal pain. Chronic left shoulder pain. Initial encounter. EXAM: CT ABDOMEN AND PELVIS WITHOUT CONTRAST TECHNIQUE: Multidetector CT imaging of the abdomen and pelvis was performed following the standard protocol without IV contrast. COMPARISON:  CT of the abdomen and pelvis from 07/31/2016, and abdominal ultrasound performed  01/07/2017 FINDINGS: Lower chest: The visualized lung bases are grossly clear. The visualized portions of the mediastinum are unremarkable. Hepatobiliary: The liver is unremarkable in appearance. The gallbladder is unremarkable in appearance. The common bile duct remains normal in caliber. Pancreas: The pancreas is within normal limits. Spleen: The spleen is unremarkable in appearance. Adrenals/Urinary Tract: The adrenal glands are unremarkable in appearance. The kidneys are within normal limits. There is no evidence of hydronephrosis. No renal or ureteral stones are identified. No perinephric stranding is seen. Stomach/Bowel: The stomach is unremarkable in appearance. The small bowel is within normal limits. The patient is status post appendectomy. The colon is unremarkable in appearance. Vascular/Lymphatic: Scattered calcification is seen along the abdominal aorta and its branches. The abdominal aorta is otherwise grossly unremarkable. The inferior vena cava is grossly unremarkable. No retroperitoneal lymphadenopathy is seen. No pelvic sidewall lymphadenopathy is identified. Reproductive: The bladder is mildly distended and  grossly unremarkable. The patient is status post hysterectomy. No suspicious adnexal masses are seen. Other: No additional soft tissue abnormalities are seen. Musculoskeletal: No acute osseous abnormalities are identified. Multiple posterior disc protrusions are noted along the lumbar spine. There is mild grade 1 retrolisthesis of L2 on L3 and of L3 on L4, and mild grade 1 anterolisthesis of L4 on L5. The visualized musculature is unremarkable in appearance. IMPRESSION: 1. No acute abnormality seen within the abdomen or pelvis. 2. Scattered aortic atherosclerosis. 3. Degenerative change along the lumbar spine, with multiple posterior disc protrusions noted, similar in appearance to 2017. Electronically Signed   By: Garald Balding M.D.   On: 01/30/2017 00:59    Labs Recent Results (from the past 2160 hour(s))  BASIC METABOLIC PANEL WITH GFR     Status: Abnormal   Collection Time: 12/27/16  3:35 PM  Result Value Ref Range   Sodium 142 135 - 146 mmol/L   Potassium 4.1 3.5 - 5.3 mmol/L   Chloride 106 98 - 110 mmol/L   CO2 26 20 - 31 mmol/L   Glucose, Bld 102 (H) 65 - 99 mg/dL   BUN 7 7 - 25 mg/dL   Creat 0.59 0.50 - 1.05 mg/dL    Comment:   For patients > or = 56 years of age: The upper reference limit for Creatinine is approximately 13% higher for people identified as African-American.      Calcium 8.9 8.6 - 10.4 mg/dL   GFR, Est African American >89 >=60 mL/min   GFR, Est Non African American >89 >=60 mL/min  Hepatitis C Antibody     Status: None   Collection Time: 12/28/16  2:55 PM  Result Value Ref Range   HCV Ab NEGATIVE NEGATIVE  POCT Occult Blood Stool     Status: Abnormal   Collection Time: 01/03/17  2:27 PM  Result Value Ref Range   Fecal Occult Blood, POC Positive (A) Negative   Card #1 Date     Card #2 Fecal Occult Blod, POC     Card #2 Date     Card #3 Fecal Occult Blood, POC     Card #3 Date    Lipase, blood     Status: None   Collection Time: 01/29/17 10:32 PM  Result  Value Ref Range   Lipase 40 11 - 51 U/L  Comprehensive metabolic panel     Status: Abnormal   Collection Time: 01/29/17 10:32 PM  Result Value Ref Range   Sodium 138 135 - 145 mmol/L   Potassium 2.8 (L) 3.5 - 5.1  mmol/L   Chloride 103 101 - 111 mmol/L   CO2 27 22 - 32 mmol/L   Glucose, Bld 118 (H) 65 - 99 mg/dL   BUN <5 (L) 6 - 20 mg/dL   Creatinine, Ser 0.54 0.44 - 1.00 mg/dL   Calcium 8.8 (L) 8.9 - 10.3 mg/dL   Total Protein 7.2 6.5 - 8.1 g/dL   Albumin 3.6 3.5 - 5.0 g/dL   AST 24 15 - 41 U/L   ALT 12 (L) 14 - 54 U/L   Alkaline Phosphatase 74 38 - 126 U/L   Total Bilirubin 0.4 0.3 - 1.2 mg/dL   GFR calc non Af Amer >60 >60 mL/min   GFR calc Af Amer >60 >60 mL/min    Comment: (NOTE) The eGFR has been calculated using the CKD EPI equation. This calculation has not been validated in all clinical situations. eGFR's persistently <60 mL/min signify possible Chronic Kidney Disease.    Anion gap 8 5 - 15  CBC     Status: None   Collection Time: 01/29/17 10:32 PM  Result Value Ref Range   WBC 9.8 3.6 - 11.0 K/uL   RBC 4.47 3.80 - 5.20 MIL/uL   Hemoglobin 14.1 12.0 - 16.0 g/dL   HCT 41.1 35.0 - 47.0 %   MCV 92.1 80.0 - 100.0 fL   MCH 31.6 26.0 - 34.0 pg   MCHC 34.3 32.0 - 36.0 g/dL   RDW 12.9 11.5 - 14.5 %   Platelets 353 150 - 440 K/uL  Urinalysis, Complete w Microscopic     Status: Abnormal   Collection Time: 01/29/17 10:32 PM  Result Value Ref Range   Color, Urine YELLOW (A) YELLOW   APPearance CLEAR (A) CLEAR   Specific Gravity, Urine 1.010 1.005 - 1.030   pH 6.0 5.0 - 8.0   Glucose, UA NEGATIVE NEGATIVE mg/dL   Hgb urine dipstick LARGE (A) NEGATIVE   Bilirubin Urine NEGATIVE NEGATIVE   Ketones, ur NEGATIVE NEGATIVE mg/dL   Protein, ur NEGATIVE NEGATIVE mg/dL   Nitrite NEGATIVE NEGATIVE   Leukocytes, UA NEGATIVE NEGATIVE   RBC / HPF 6-30 0 - 5 RBC/hpf   WBC, UA 0-5 0 - 5 WBC/hpf   Bacteria, UA NONE SEEN NONE SEEN   Squamous Epithelial / LPF 0-5 (A) NONE SEEN    Mucous PRESENT   Troponin I     Status: None   Collection Time: 01/29/17 10:32 PM  Result Value Ref Range   Troponin I <0.03 <0.03 ng/mL  Wet prep, genital     Status: Abnormal   Collection Time: 01/30/17 12:15 AM  Result Value Ref Range   Yeast Wet Prep HPF POC NONE SEEN NONE SEEN   Trich, Wet Prep NONE SEEN NONE SEEN   Clue Cells Wet Prep HPF POC PRESENT (A) NONE SEEN   WBC, Wet Prep HPF POC MODERATE (A) NONE SEEN   Sperm NONE SEEN   Troponin I     Status: None   Collection Time: 01/30/17  1:29 AM  Result Value Ref Range   Troponin I <0.03 <0.03 ng/mL    Assessment/Plan:  Peripheral neuropathy (HCC) (lower extremity) (Bilateral) (L>R) On lyrica and pain medications  Tobacco use disorder We had a discussion for approximately 3 minutes regarding the absolute need for smoking cessation due to the deleterious nature of tobacco on the vascular system. We discussed the tobacco use would diminish patency of any intervention, and likely significantly worsen progressio of disease. We discussed multiple agents for quitting  including replacement therapy or medications to reduce cravings such as Chantix. The patient voices their understanding of the importance of smoking cessation. She is trying ecigs now for smoking cessation   Renal infarct ([place]) Her overall kidney sizes preserved and her renal arteries appear normal without any significant stenosis. She should continue aspirin therapy. No further renal arterial evaluation is planned at this time.  Abdominal pain The patient has generalized abdominal pain that is worse after eating with atherosclerotic risk factors including ongoing tobacco use. Given this, it is certainly reasonable to assess her for chronic mesenteric ischemia. Her symptoms are not classic for chronic mesenteric ischemia, but that is certainly a possibility. We can perform a mesenteric duplex to evaluate this. I would recommend she go ahead and get her endoscopy done first  as that is more likely to be revealing.      Leotis Pain 02/26/2017, 10:16 AM   This note was created with Dragon medical transcription system.  Any errors from dictation are unintentional.

## 2017-02-26 NOTE — Patient Instructions (Signed)
Chronic Mesenteric Ischemia Mesenteric ischemia is poor blood flow (circulation) in the vessels that supply blood to the stomach, intestines, and liver (mesenteric organs). Chronic mesenteric ischemia, also called mesenteric angina or intestinal angina, is a long-term (chronic) condition. It happens when an artery or vein that provides blood to the mesenteric organs gradually becomes blocked or narrow, restricting the blood supply to the organs. When the blood supply is severely restricted, the mesenteric organs cannot work properly. What are the causes? This condition is commonly caused by fatty deposits that build up in an artery (plaque), which can narrow the artery and restrict blood flow. Other causes include:  Weakened areas in blood vessel walls (aneurysms).  Conditions that cause twisting or inflammation of blood vessels, such as fibromuscular dysplasia or arteritis.  A disorder in which blood clots form in the veins (venous thrombosis).  Scarring and thickening (fibrosis) of blood vessels caused by radiation therapy.  A tear in the aorta, the body's main artery (aortic dissection).  Blood vessel problems after illegal drug use, such as use of cocaine.  Tumors in the nervous system (neurofibromatosis).  Certain autoimmune diseases, such as lupus.  What increases the risk? The following factors may make you more likely to develop this condition:  Being female.  Being over age 50, especially if you have a history of heart problems.  Smoking.  Congestive heart failure.  Irregular heartbeat (arrhythmia).  Having a history of heart attack or stroke.  Diabetes.  High cholesterol.  High blood pressure (hypertension).  Being overweight or obese.  Kidney disease (renal disease) requiring dialysis.  What are the signs or symptoms? Symptoms of this condition include:  Abdomen (abdominal) pain or cramps that develop 15-60 minutes after a meal. This pain may last for 1-3  hours. Some people may develop a fear of eating because of this symptom.  Weight loss.  Diarrhea.  Bloody stool.  Nausea.  Vomiting.  Bloating.  Abdominal pain after stress or with exercise.  How is this diagnosed? This condition is diagnosed based on:  Your medical history.  A physical exam.  Tests, such as: ? Ultrasound. ? CT scan. ? Blood tests. ? Urine tests. ? An imaging test that involves injecting a dye into your arteries to show blood flow through blood vessels (angiogram). This can help to show if there are any blockages in the vessels that lead to the intestines. ? Passing a small probe through the mouth and into the stomach to measure the output of carbon dioxide (gastric tonometry). This can help to indicate whether there is decreased blood flow to the stomach and intestines.  How is this treated? This condition may be treated with:  Dietary changes such as eating smaller, low-fat, meals more frequently.  Lifestyle changes to treat underlying conditions that contribute to the disease, such as high cholesterol and high blood pressure.  Medicines to reduce blood clotting and increase blood flow.  Surgery to remove the blockage, repair arteries or veins, and restore blood flow. This may involve: ? Angioplasty. This is surgery to widen the affected artery, reduce the blockage, and sometimes insert a small, mesh tube (stent). ? Bypass surgery. This may be done to go around (bypass) the blockage and reconnect healthy arteries or veins. ? Placing a stent in the affected area. This may be done to help keep blocked arteries open.  Follow these instructions at home: Eating and drinking  Eat a heart-healthy diet. This includes fresh fruits and vegetables, whole grains, and lean proteins   like chicken, fish, eggs, and beans.  Avoid foods that contain a lot of: ? Salt (sodium). ? Sugar. ? Saturated fat (such as red meat). ? Trans fat (such as fried foods).  Stay  hydrated. Drink enough fluid to keep your urine clear or pale yellow. Lifestyle  Stay active and get regular exercise as told by your health care provider. Aim for 150 minutes of moderate activity or 75 minutes of vigorous activity a week. Ask your health care provider what activities and forms of exercise are safe for you.  Maintain a healthy weight.  Work with your health care provider to manage your cholesterol.  Manage any other health problems you have, such as high blood pressure, diabetes, or heart rhythm problems.  Do not use any products that contain nicotine or tobacco, such as cigarettes and e-cigarettes. If you need help quitting, ask your health care provider. General instructions  Take over-the-counter and prescription medicines only as told by your health care provider.  Keep all follow-up visits as told by your health care provider. This is important. Contact a health care provider if:  Your symptoms do not improve or they return after treatment.  You have a fever. Get help right away if:  You have severe abdominal pain.  You have severe chest pain.  You have shortness of breath.  You feel weak or dizzy.  You have palpitations.  You have numbness or weakness in your face, arm, or leg.  You are confused.  You have trouble speaking or people have trouble understanding what you are saying.  You are constipated.  You have trouble urinating.  You have blood in your stool.  You have severe nausea, vomiting, or persistent diarrhea. Summary  Mesenteric ischemia is poor circulation in the vessels that supply blood to the the stomach, intestines, and liver (mesenteric organs).  This condition happens when an artery or vein that provides blood to the mesenteric organs gradually becomes blocked or narrow, restricting the blood supply to the organs.  This condition is commonly caused by fatty deposits that build up in an artery (plaque), which can narrow the  artery and restrict blood flow.  You are more likely to develop this condition if you are over age 50 and have a history of heart problems, high blood pressure, diabetes, or high cholesterol.  This condition is usually treated with medicines, dietary and lifestyle changes, and surgery to remove the blockage, repair arteries or veins, and restore blood flow. This information is not intended to replace advice given to you by your health care provider. Make sure you discuss any questions you have with your health care provider. Document Released: 06/18/2011 Document Revised: 10/13/2016 Document Reviewed: 10/13/2016 Elsevier Interactive Patient Education  2017 [employer]  

## 2017-02-26 NOTE — Assessment & Plan Note (Signed)
On lyrica and pain medications

## 2017-02-26 NOTE — Assessment & Plan Note (Signed)
We had a discussion for approximately 3 minutes regarding the absolute need for smoking cessation due to the deleterious nature of tobacco on the vascular system. We discussed the tobacco use would diminish patency of any intervention, and likely significantly worsen progressio of disease. We discussed multiple agents for quitting including replacement therapy or medications to reduce cravings such as Chantix. The patient voices their understanding of the importance of smoking cessation. She is trying ecigs now for smoking cessation

## 2017-02-26 NOTE — Assessment & Plan Note (Signed)
Her overall kidney sizes preserved and her renal arteries appear normal without any significant stenosis. She should continue aspirin therapy. No further renal arterial evaluation is planned at this time.

## 2017-03-05 ENCOUNTER — Ambulatory Visit (INDEPENDENT_AMBULATORY_CARE_PROVIDER_SITE_OTHER): Payer: Medicaid Other | Admitting: Vascular Surgery

## 2017-03-05 ENCOUNTER — Encounter (INDEPENDENT_AMBULATORY_CARE_PROVIDER_SITE_OTHER): Payer: Medicaid Other

## 2017-03-06 DIAGNOSIS — I208 Other forms of angina pectoris: Secondary | ICD-10-CM | POA: Insufficient documentation

## 2017-03-06 DIAGNOSIS — I2510 Atherosclerotic heart disease of native coronary artery without angina pectoris: Secondary | ICD-10-CM | POA: Insufficient documentation

## 2017-03-07 ENCOUNTER — Encounter: Payer: Self-pay | Admitting: *Deleted

## 2017-03-07 NOTE — Progress Notes (Signed)
Per Gwinda Passe at [facility], patient did not have stress test or ECHO completed. Cardiac clearance from Dr. Nehemiah Massed for upper and lower endoscopy is pending testing.

## 2017-03-13 ENCOUNTER — Telehealth: Payer: Self-pay | Admitting: General Surgery

## 2017-03-13 NOTE — Telephone Encounter (Signed)
She will need to be seen prior to her colonoscopy ( once she has had clearance)  and the hernia can be evaluated at that time.

## 2017-03-13 NOTE — Telephone Encounter (Signed)
Patient called today to make an appointment for a hernia.DOES PATIENT NEED TO SEE DR SHAW FIRST BEFORE APPOINTMENT CAN BE MADE TO OBTAIN REFERRAL?. SHE WAS REFERRED FOR AND UPPER & LOWER BUT NEEDS TO OBTAIN CARDIAC CLEARANCE WHICH HAS NOT BEEN DONE.SHE STATES SHE HAS AN APPOINTMENT ON 03-19-17 FOR CLEARANCE.

## 2017-03-14 ENCOUNTER — Telehealth: Payer: Self-pay | Admitting: General Surgery

## 2017-03-14 NOTE — Telephone Encounter (Signed)
LEFT MESSAGE FOR PATIENT TO CALL BACK TO LET HER KNOW PER DR BYRNETT She will need to be seen prior to her colonoscopy ( once she has had clearance)  and the hernia can be evaluated at that time.

## 2017-03-18 ENCOUNTER — Ambulatory Visit (INDEPENDENT_AMBULATORY_CARE_PROVIDER_SITE_OTHER): Payer: Medicaid Other | Admitting: Obstetrics and Gynecology

## 2017-03-18 ENCOUNTER — Encounter: Payer: Medicaid Other | Admitting: Obstetrics and Gynecology

## 2017-03-18 VITALS — BP 122/86 | HR 82 | Ht 66.0 in | Wt 142.7 lb

## 2017-03-18 DIAGNOSIS — N760 Acute vaginitis: Secondary | ICD-10-CM

## 2017-03-18 DIAGNOSIS — R1030 Lower abdominal pain, unspecified: Secondary | ICD-10-CM

## 2017-03-18 DIAGNOSIS — N952 Postmenopausal atrophic vaginitis: Secondary | ICD-10-CM

## 2017-03-18 DIAGNOSIS — Z79890 Hormone replacement therapy (postmenopausal): Secondary | ICD-10-CM | POA: Diagnosis not present

## 2017-03-18 DIAGNOSIS — B9689 Other specified bacterial agents as the cause of diseases classified elsewhere: Secondary | ICD-10-CM

## 2017-03-18 MED ORDER — METRONIDAZOLE 0.75 % VA GEL: 1.0000 | Freq: Every day | VAGINAL | 1 refills | Status: DC

## 2017-03-18 NOTE — Progress Notes (Signed)
GYNECOLOGY CLINIC PROGRESS NOTE  Subjective:     Patricia Rollins is a 56 y.o. G38P3003 female who presents for evaluation of a possible BV infection. Symptoms have been present for 2 months. Vaginal symptoms: local irritation and pain. Denies abnormal discharge or odor.  Had a BV infection in February for which she was treated with Flagyl.  She denies abnormal bleeding, discharge and lesions.   Patient also notes that she has questions regarding her use of oral HRT.  Patient currently taking Premarin 0.625 mg tablets daily.  Had previously attempted to get the patient to wean from HRT (went down to 0.3 mg tablets, however notes hot flushes returned, so she went back to original dosing.  Notes that she was seen by Dr. Amalia Hailey and at that time was desiring to switch from oral HRT to vaginal estrogen due to her health issues, however has been hesitant as she understands that the vaginal estrogen will not control her vasomotor symptoms.  Desires to discuss other options.   The following portions of the patient's history were reviewed and updated as appropriate: allergies, current medications, past family history, past medical history, past social history, past surgical history and problem list.   Review of Systems A comprehensive review of systems was negative except for: Gastrointestinal: positive for abdominal pain and constipation.  Is taking a laxative almost daily.    Objective:    BP 122/86 (BP Location: Left Arm, Patient Position: Sitting, Cuff Size: Normal)   Pulse 82   Ht 5\' 6"  (1.676 m)   Wt 142 lb 11.2 oz (64.7 kg)   BMI 23.03 kg/m  General appearance: alert and no distress   Abdomen: soft, non-tender; bowel sounds normal; no masses,  no organomegaly Pelvic: external genitalia normal, rectovaginal septum normal.  Vagina atrophic with small amount of thin white discharge.  Uterus and cervix surgically absent.      Microscopic wet-mount exam shows clue cells, few WBCs, no  trichomonads, yeast or RBCs.  KOH done.   Assessment:    Bacterial vaginosis.  Menopausal female on HRT therapy Vaginal atrophy Abdominal pain  Plan:   - Patient with second infection of bacterial vaginosis. Discussed treatment options. Patient notes that oral tablets caused GI upset.  Will prescribe Metrogel, see orders. Discussed hygiene measures, and advised to consume more yogurt or take a daily probiotic.  - Patient with bothersome menopausal vasomotor symptoms, currently on HRT but desiring to discontinue. Discussed lifestyle interventions such as wearing light clothing, remaining in cool environments, having fan/air conditioner in the room, avoiding hot beverages etc.  Reiterated using hormone therapy and concerns about increased risk of heart disease, cerebrovascular disease, thromboembolic disease,  and breast cancer.  Also discussed other medical options such as Paxil, Effexor or Neurontin.   Patient notes that she would not like to be on Paxil because it scares her, and has tried Neurontin in the past with no results.  Also discussed alternative therapies such as herbal remedies but cautioned that most of the products contained phytoestrogens (plant estrogens) in unregulated amounts which can have the same effects on the body as the pharmaceutical estrogen preparations.  Patient reports taking Estroven in the past which really helped her symptoms.  Discussed several times that she could not take this as well as initiating vaginal estrogen therapy.  Given handouts on all options and encouraged patient to try weaning again and can try a non-hormonal therapy for her vasomotor symptoms and the vaginal cream for her vaginal  atrophy and irritation symptoms. Multiple questions answered on today's visit.  - Abdominal pain with constipation, patient notes that she has been seen by GI, and is to be scheduled for an endoscopy and colonoscopy, but has to undergo cardiac clearance first.    To f/u 2-3  weeks after patient has weaned from HRT and has initiated vaginal cream.    A total of 25 minutes were spent face-to-face with the patient during the encounter and over half of that time involved counseling and coordination of care.   Rubie Maid, MD [facility]

## 2017-03-19 ENCOUNTER — Ambulatory Visit: Payer: Medicaid Other | Admitting: Cardiovascular Disease

## 2017-03-27 ENCOUNTER — Encounter (INDEPENDENT_AMBULATORY_CARE_PROVIDER_SITE_OTHER): Payer: Medicaid Other

## 2017-03-27 ENCOUNTER — Ambulatory Visit (INDEPENDENT_AMBULATORY_CARE_PROVIDER_SITE_OTHER): Payer: Medicaid Other | Admitting: Vascular Surgery

## 2017-03-27 NOTE — Progress Notes (Signed)
Cardiology Office Note  Date:  03/28/2017   ID:  Patricia Rollins, DOB 05/19/1961, MRN [id number]  PCP:  Patricia Nova, MD   Chief Complaint  Patient presents with  . other    Needs a cardiac clearance for an endoscopy and colonoscopy. Meds reviewed by the pt. verbally.     HPI:   Patricia Rollins is a pleasant 56 year old woman with  long history of smoking who continues to smoke one pack per day,  In nic inhaler COPD, CAD  PAD noted on CT scan of the chest and abdomen,  hyperlipidemia,  who for follow-up of her coronary disease and peripheral arterial disease.  Having stomach problems, bloating Pressure between shoulder blades after eating Scheduled for EGD, colo Tried protonix, and pepcidHad to stop the medication as she was having headaches  Recent CT scan abdomen for abdominal pain Results reviewed with her   CT scan chest and abdomen/pelvis performed in September and again in October 2017  CT scan  results discussed with her again today   mild to moderate coronary calcification in the mid LAD, proximal RCA Also has mild to moderate aortic arch calcification, moderate distal descending aorta disease, mild to moderate common iliac and bilateral femoral arterial disease   continues to smoke, trying to quit  She reports that she started smoking age 34  Tried chantix, had vivid dreams,    denies any chest pain concerning for angina  Has not been taking her lovastatin on a regular basis as it upsets her stomach   EKG personally reviewed by myself on todays visit Shows normal sinus rhythm with rate 91 bpm no significant ST or T-wave changes    PMH:   has a past medical history of Anxiety; Anxiety; Arthritis; Depression; Fibromyalgia; Hematuria (06/21/2016); and Neuromuscular disorder ([place]).  PSH:    Past Surgical History:  Procedure Laterality Date  . ABDOMINAL HYSTERECTOMY    . APPENDECTOMY    . OOPHORECTOMY    . TONSILLECTOMY AND ADENOIDECTOMY Bilateral      Current Outpatient Prescriptions  Medication Sig Dispense Refill  . ALPRAZolam (XANAX) 1 MG tablet Take 1 tablet (1 mg total) by mouth 4 (four) times daily. 84 tablet 0  . aspirin EC 81 MG tablet Take 81 mg by mouth daily.    Marland Kitchen estrogens, conjugated, (PREMARIN) 0.625 MG tablet Take 0.625 mg by mouth daily. Take daily for 21 days then do not take for 7 days.    Marland Kitchen lactulose (CEPHULAC) 10 g packet Take 1 packet (10 g total) by mouth 3 (three) times daily. 30 each 0  . lovastatin (MEVACOR) 20 MG tablet Take 20 mg by mouth at bedtime.    . nicotine (NICOTROL) 10 MG inhaler 10 cartridges/day maximum, use frequent  puffing x 20 mins for each cartridge 420 each 0  . pregabalin (LYRICA) 25 MG capsule Take 1 capsule (25 mg total) by mouth daily. 90 capsule 0  . promethazine (PHENERGAN) 12.5 MG tablet Take 1 tablet (12.5 mg total) by mouth every 6 (six) hours as needed for nausea or vomiting. 12 tablet 0  . tiZANidine (ZANAFLEX) 4 MG tablet Take 4 mg by mouth every 6 (six) hours as needed for muscle spasms.     No current facility-administered medications for this visit.      Allergies:   Prozac [fluoxetine] and Sulfa antibiotics   Social History:  The patient  reports that she has been smoking Cigarettes.  She has been smoking about 0.25 packs per  day. She has never used smokeless tobacco. She reports that she does not drink alcohol or use drugs.   Family History:   family history includes Ovarian cancer in her mother; Pancreatic cancer in her mother.    Review of Systems: Review of Systems  Constitutional: Negative.   Respiratory: Negative.   Cardiovascular: Negative.   Gastrointestinal: Positive for abdominal pain.       Bloating  Musculoskeletal: Negative.   Neurological: Negative.   Psychiatric/Behavioral: Negative.   All other systems reviewed and are negative.    PHYSICAL EXAM: VS:  BP 104/72 (BP Location: Left Arm, Patient Position: Sitting, Cuff Size: Normal)   Ht 5\' 6"   (1.676 m)   Wt 143 lb (64.9 kg)   BMI 23.08 kg/m  , BMI Body mass index is 23.08 kg/m. GEN: Well nourished, well developed, in no acute distress  HEENT: normal  Neck: no JVD, carotid bruits, or masses Cardiac: RRR; no murmurs, rubs, or gallops,no edema  Respiratory:  clear to auscultation bilaterally, normal work of breathing GI: soft, nontender, nondistended, + BS MS: no deformity or atrophy  Skin: warm and dry, no rash Neuro:  Strength and sensation are intact Psych: euthymic mood, full affect    Recent Labs: 07/18/2016: TSH 0.70 01/29/2017: ALT 12; BUN <5; Creatinine, Ser 0.54; Hemoglobin 14.1; Platelets 353; Potassium 2.8; Sodium 138    Lipid Panel Lab Results  Component Value Date   CHOL 205 (H) 07/18/2016   HDL 54 07/18/2016   LDLCALC 122 07/18/2016   TRIG 143 07/18/2016      Wt Readings from Last 3 Encounters:  03/28/17 143 lb (64.9 kg)  03/18/17 142 lb 11.2 oz (64.7 kg)  02/26/17 144 lb (65.3 kg)       ASSESSMENT AND PLAN:  Preop cardiovascular Acceptable risk for EGD and colonoscopy No further testing needed  Smoker Long discussion concerning smoking cessation She does not want Chantix or other modalities Strongly recommended she quit  Coronary artery disease involving coronary bypass graft of native heart without angina pectoris Currently with no symptoms of angina. No further workup at this time. Continue current medication regimen.  Aortic atherosclerosis (HCC)  mild to moderate in nature,  Recommended she stay on her cholesterol medication, square smoking   PAD (peripheral artery disease) ([place])  denies any claudication type symptoms  Mild to moderate disease of the common iliac, femoral arteries bilaterally, distal descending aorta   Pure hypercholesterolemia Taking lovastatin inconsistently secondary to stomach issues recommended when she takes medication on a regular basis for at least 2 months we would recheck lipid panel    Total  encounter time more than 25 minutes  Greater than 50% was spent in counseling and coordination of care with the patient   Disposition:   F/U  12 months  No orders of the defined types were placed in this encounter.    Signed, Esmond Plants, M.D., Ph.D. 03/28/2017  [place], [place]

## 2017-03-28 ENCOUNTER — Ambulatory Visit (INDEPENDENT_AMBULATORY_CARE_PROVIDER_SITE_OTHER): Payer: Medicaid Other | Admitting: Cardiovascular Disease

## 2017-03-28 ENCOUNTER — Ambulatory Visit: Payer: Medicaid Other | Admitting: Family Medicine

## 2017-03-28 ENCOUNTER — Encounter: Payer: Self-pay | Admitting: Cardiovascular Disease

## 2017-03-28 ENCOUNTER — Telehealth: Payer: Self-pay | Admitting: *Deleted

## 2017-03-28 VITALS — BP 104/72 | Ht 66.0 in | Wt 143.0 lb

## 2017-03-28 DIAGNOSIS — E7800 Pure hypercholesterolemia, unspecified: Secondary | ICD-10-CM

## 2017-03-28 DIAGNOSIS — I25810 Atherosclerosis of coronary artery bypass graft(s) without angina pectoris: Secondary | ICD-10-CM | POA: Diagnosis not present

## 2017-03-28 DIAGNOSIS — I700 Atherosclerosis of aorta: Secondary | ICD-10-CM

## 2017-03-28 DIAGNOSIS — F17210 Nicotine dependence, cigarettes, uncomplicated: Secondary | ICD-10-CM | POA: Diagnosis not present

## 2017-03-28 DIAGNOSIS — I739 Peripheral vascular disease, unspecified: Secondary | ICD-10-CM

## 2017-03-28 NOTE — Patient Instructions (Signed)

## 2017-03-28 NOTE — Telephone Encounter (Signed)
Patient called the office wanting to arrange for upper and lower endoscopy. She has received cardiac clearance from Dr. Rockey Situ. Note is in EPIC.   Patient has been scheduled for an upper and lower endoscopy on 05-08-17 at [facility]. Miralax prescription will be sent in to the patient's pharmacy at time of pre-op visit on 04-24-17. Instructions will be reviewed with the patient at pre-op visit as well. This patient is aware to call the office if she has further questions.   Hernia will be discussed at time of office visit as well.

## 2017-03-29 ENCOUNTER — Telehealth: Payer: Self-pay

## 2017-03-29 NOTE — Telephone Encounter (Signed)
Pt left a vm at 4:23 pm stating she is still having the same problem. She would like to know what to do.

## 2017-04-01 NOTE — Telephone Encounter (Signed)
Patient was expecting call back with answers today - she requested to schedule an appointment - she is scheduled for 04/04/2017.  She would still like a call back

## 2017-04-01 NOTE — Telephone Encounter (Signed)
Called pt no answer. LM for pt informing her to call back.

## 2017-04-02 ENCOUNTER — Other Ambulatory Visit (INDEPENDENT_AMBULATORY_CARE_PROVIDER_SITE_OTHER): Payer: Self-pay | Admitting: Vascular Surgery

## 2017-04-02 DIAGNOSIS — K551 Chronic vascular disorders of intestine: Secondary | ICD-10-CM

## 2017-04-02 NOTE — Telephone Encounter (Signed)
Called pt informed her that she needs to be seen for recurrent BV and that there is nothing I can do for her via phone until she is seen. Pt has appt in 2 days. Pt gave verbal understanding

## 2017-04-02 NOTE — Telephone Encounter (Signed)
Patient is calling again to speak with someone  Please call

## 2017-04-04 ENCOUNTER — Ambulatory Visit (INDEPENDENT_AMBULATORY_CARE_PROVIDER_SITE_OTHER): Payer: Medicaid Other | Admitting: Obstetrics and Gynecology

## 2017-04-04 ENCOUNTER — Encounter: Payer: Self-pay | Admitting: Obstetrics and Gynecology

## 2017-04-04 VITALS — BP 111/72 | HR 84 | Ht 66.0 in | Wt 142.0 lb

## 2017-04-04 DIAGNOSIS — R1030 Lower abdominal pain, unspecified: Secondary | ICD-10-CM

## 2017-04-04 DIAGNOSIS — R102 Pelvic and perineal pain: Secondary | ICD-10-CM | POA: Diagnosis not present

## 2017-04-04 NOTE — Progress Notes (Signed)
GYNECOLOGY PROGRESS NOTE  Subjective:    Patient ID: Patricia Rollins, female    DOB: 04-08-1961, 56 y.o.   MRN: [id number]  HPI  Patient is a 56 y.o. G71P3003 female who presents for multiple complaints. Patient notes continued vaginal discomfort and pain.  States that she took the medication for the BV infection, which helped for a short while, but symptoms returned.  Wonders if her bowel infection could be causing an infection in her vagina.  Notes she is consuming more yogurt, and using mild soaps in vaginal region. Is also using Vagisil wipes which offer minimal relief. Patient also notes that she is having abominal pain.  Has been seen by GI, who have her scheduled for an endoscopy, but this is 3 weeks away.  Notes she needs it to be sooner because she is hurting.  States that she needs a colonoscopy, however when attempted in October she could not tolerate the bowel prep and so it had to be postponed. Is taking laxatives prn for bowel movements, currently denies constipation. She notes that she has had several CT scans that have not shown anything. Patient states that she is just miserable and tired of feeling this way.   The following portions of the patient's history were reviewed and updated as appropriate: allergies, current medications, past family history, past medical history, past social history, past surgical history and problem list.   Review of Systems Pertinent items noted in HPI and remainder of comprehensive ROS otherwise negative.   Objective:   Blood pressure 111/72, pulse 84, height 5\' 6"  (1.676 m), weight 142 lb (64.4 kg). General appearance: alert and no distress Abdomen: soft, non-tender; bowel sounds normal; no masses,  no organomegaly Pelvic: external genitalia normal, rectovaginal septum normal.  Vagina without discharge. Mild vaginal atrophy noted.  Tenderness along bladder neck and right vaginal sidewall. No lesions visible or palpable. Cervix and uterus surgically  absent.  Adnexae surgically absent.  Extremities: extremities normal, atraumatic, no cyanosis or edema Neurologic: Grossly normal   Assessment:   Abdominal pain  Vaginal pain  Plan:   - Abdominal pain present.  No evidence of pain on today's exam.  Has endoscopy scheduled. Discussed with patient that she would need to speak with her GI doctor regarding rescheduling her colonoscopy.  Patient wonders if her pain could also be due to her bladder as bladder neck tenderness noted today.  Notes she has had problems with her bladder in the past.  Had a workup with Urology last year which was normal, however patient notes "she thinks it was not done properly".  Desires a referral to another Urologist. Given list of Urologists in the surrounding areas.  - Vaginal pain - patient s/p treatment with Flagyl, still noting residual symptoms despite changes in hygiene, treatment with medication.  Discussed that she would likely benefit from vaginal estrogen therapy, however is currently taking oral HRT.  Patient notes that she would like to try to come off medication, as she is now realizing that she started the medication in October and all of her problems began appearing in November.  Discussed that cessation of hormones would lead to return of her vasomotor symptoms, which could be managed by other medications.  Patient notes that if she is able to discontinue the tablets and her symptoms resolve, she would like to go back to use of OTC Estroven. Patient to inform in 2 weeks if symptoms improve or not.  Discussed that if symptoms do not resolve,  she may need to have a vaginal biopsy performed.    A total of 25 minutes were spent face-to-face with the patient during this encounter and over half of that time dealt with counseling and coordination of care.   Rubie Maid, MD [facility]

## 2017-04-06 NOTE — Patient Instructions (Addendum)

## 2017-04-15 ENCOUNTER — Ambulatory Visit: Payer: Medicaid Other

## 2017-04-18 ENCOUNTER — Ambulatory Visit (INDEPENDENT_AMBULATORY_CARE_PROVIDER_SITE_OTHER): Payer: Medicaid Other | Admitting: Vascular Surgery

## 2017-04-18 ENCOUNTER — Telehealth: Payer: Self-pay

## 2017-04-18 ENCOUNTER — Encounter (INDEPENDENT_AMBULATORY_CARE_PROVIDER_SITE_OTHER): Payer: Medicaid Other

## 2017-04-18 NOTE — Telephone Encounter (Signed)
Pt wants to know if a CAT scan is needed that id scheduled for her on Mon, she wanted to know of a CT and a CAT scan are the same and wanted to know what you wanted her to do

## 2017-04-18 NOTE — Telephone Encounter (Signed)
Patient is scheduled to have a CT scan angiogram on Monday, June 11, this was ordered by Dr. Bunnie Domino staff. I suspect that this may have been ordered to rule out  mesenteric ischemia since she is still experiencing abdominal pain. I explained that the test is necessary to rule out any blockages in her gut which may be contributing to abdominal pain. She should increase fluid consumption and make sure her kidneys are working fine as she is going to receive the dye for the angiogram. Patient verbalized agreement

## 2017-04-22 ENCOUNTER — Ambulatory Visit
Admission: RE | Admit: 2017-04-22 | Discharge: 2017-04-22 | Disposition: A | Discharge status: Home / Self Care | Payer: Medicaid Other | Source: Ambulatory Visit | Attending: Vascular Surgery | Admitting: Vascular Surgery

## 2017-04-22 DIAGNOSIS — M5136 Other intervertebral disc degeneration, lumbar region: Secondary | ICD-10-CM | POA: Insufficient documentation

## 2017-04-22 DIAGNOSIS — K551 Chronic vascular disorders of intestine: Secondary | ICD-10-CM

## 2017-04-22 MED ORDER — IOPAMIDOL (ISOVUE-370) INJECTION 76%
100.0000 mL | Freq: Once | INTRAVENOUS | Status: AC | PRN
  Administered 2017-04-22: 100 mL via INTRAVENOUS

## 2017-04-24 ENCOUNTER — Encounter: Payer: Self-pay | Admitting: General Surgery

## 2017-04-24 ENCOUNTER — Ambulatory Visit (INDEPENDENT_AMBULATORY_CARE_PROVIDER_SITE_OTHER): Payer: Medicaid Other | Admitting: General Surgery

## 2017-04-24 VITALS — BP 116/68 | HR 74 | Resp 12 | Ht 66.0 in | Wt 141.0 lb

## 2017-04-24 DIAGNOSIS — K5909 Other constipation: Secondary | ICD-10-CM | POA: Diagnosis not present

## 2017-04-24 DIAGNOSIS — R634 Abnormal weight loss: Secondary | ICD-10-CM | POA: Diagnosis not present

## 2017-04-24 DIAGNOSIS — R109 Unspecified abdominal pain: Secondary | ICD-10-CM

## 2017-04-24 MED ORDER — METOCLOPRAMIDE HCL 10 MG PO TABS: 10.0000 mg | ORAL_TABLET | Freq: Once | ORAL | 0 refills | Status: DC

## 2017-04-24 MED ORDER — MAGNESIUM CITRATE PO SOLN: 1.0000 | Freq: Once | ORAL | 0 refills | Status: AC

## 2017-04-24 NOTE — Patient Instructions (Addendum)
Patient to be on a liquid diet for 2 days prior to procedure.   Colonoscopy, Adult A colonoscopy is an exam to look at the entire large intestine. During the exam, a lubricated, bendable tube is inserted into the anus and then passed into the rectum, colon, and other parts of the large intestine. A colonoscopy is often done as a part of normal colorectal screening or in response to certain symptoms, such as anemia, persistent diarrhea, abdominal pain, and blood in the stool. The exam can help screen for and diagnose medical problems, including:  Tumors.  Polyps.  Inflammation.  Areas of bleeding.  Tell a health care provider about:  Any allergies you have.  All medicines you are taking, including vitamins, herbs, eye drops, creams, and over-the-counter medicines.  Any problems you or family members have had with anesthetic medicines.  Any blood disorders you have.  Any surgeries you have had.  Any medical conditions you have.  Any problems you have had passing stool. What are the risks? Generally, this is a safe procedure. However, problems may occur, including:  Bleeding.  A tear in the intestine.  A reaction to medicines given during the exam.  Infection (rare).  What happens before the procedure? Eating and drinking restrictions Follow instructions from your health care provider about eating and drinking, which may include:  A few days before the procedure - follow a low-fiber diet. Avoid nuts, seeds, dried fruit, raw fruits, and vegetables.  1-3 days before the procedure - follow a clear liquid diet. Drink only clear liquids, such as clear broth or bouillon, black coffee or tea, clear juice, clear soft drinks or sports drinks, gelatin dessert, and popsicles. Avoid any liquids that contain red or purple dye.  On the day of the procedure - do not eat or drink anything during the 2 hours before the procedure, or within the time period that your health care provider  recommends.  Bowel prep If you were prescribed an oral bowel prep to clean out your colon:  Take it as told by your health care provider. Starting the day before your procedure, you will need to drink a large amount of medicated liquid. The liquid will cause you to have multiple loose stools until your stool is almost clear or light green.  If your skin or anus gets irritated from diarrhea, you may use these to relieve the irritation: ? Medicated wipes, such as adult wet wipes with aloe and vitamin E. ? A skin soothing-product like petroleum jelly.  If you vomit while drinking the bowel prep, take a break for up to 60 minutes and then begin the bowel prep again. If vomiting continues and you cannot take the bowel prep without vomiting, call your health care provider.  General instructions  Ask your health care provider about changing or stopping your regular medicines. This is especially important if you are taking diabetes medicines or blood thinners.  Plan to have someone take you home from the hospital or clinic. What happens during the procedure?  An IV tube may be inserted into one of your veins.  You will be given medicine to help you relax (sedative).  To reduce your risk of infection: ? Your health care team will wash or sanitize their hands. ? Your anal area will be washed with soap.  You will be asked to lie on your side with your knees bent.  Your health care provider will lubricate a long, thin, flexible tube. The tube will have a  camera and a light on the end.  The tube will be inserted into your anus.  The tube will be gently eased through your rectum and colon.  Air will be delivered into your colon to keep it open. You may feel some pressure or cramping.  The camera will be used to take images during the procedure.  A small tissue sample may be removed from your body to be examined under a microscope (biopsy). If any potential problems are found, the tissue  will be sent to a lab for testing.  If small polyps are found, your health care provider may remove them and have them checked for cancer cells.  The tube that was inserted into your anus will be slowly removed. The procedure may vary among health care providers and hospitals. What happens after the procedure?  Your blood pressure, heart rate, breathing rate, and blood oxygen level will be monitored until the medicines you were given have worn off.  Do not drive for 24 hours after the exam.  You may have a small amount of blood in your stool.  You may pass gas and have mild abdominal cramping or bloating due to the air that was used to inflate your colon during the exam.  It is up to you to get the results of your procedure. Ask your health care provider, or the department performing the procedure, when your results will be ready. This information is not intended to replace advice given to you by your health care provider. Make sure you discuss any questions you have with your health care provider. Document Released: 10/26/2000 Document Revised: 08/29/2016 Document Reviewed: 01/10/2016 Elsevier Interactive Patient Education  2018 [employer].

## 2017-04-24 NOTE — Progress Notes (Signed)
Patient ID: Patricia Rollins, female   DOB: 16-Dec-1960, 56 y.o.   MRN: [id number]  Chief Complaint  Patient presents with  . Pre-op Exam    Upper and Lower endoscopy 05/08/17    HPI Patricia Rollins is a 56 y.o. female is here today for pre-op upper and lower endoscopy scheduled for 05/08/17. Patient reports she is having abdominal pain located in her lower abdomen. This has been going on since September 2017.Patient saw Dr. Rockey Situ for cardiac clearance on 03/29/17. Patient states she had a CT ordered by Dr. Jairo Ben is unaware of the results-states this was ordered to check her blood flow to her lower intestines. Moving bowels daily as long as she takes her Dulcolax.   The patient was evaluated by Dr. Vicente Males in the past and she underwent 2 colonoscopy preps but was unable to complete the procedure due to vomiting.  She states she was supposed to have a colonoscopy in November 2017 with a different office but she vomited during her prep for the procedure she was unable to tolerate it. Previously placed on [place] without improvement. Prior plain films that she ingested right sided stool.      lHPI  Past Medical History:  Diagnosis Date  . Anxiety   . Anxiety   . Arthritis    patient has bilateral bursitis in hips  . Depression   . Fibromyalgia   . Hematuria 06/21/2016  . Neuromuscular disorder [facility])     Past Surgical History:  Procedure Laterality Date  . ABDOMINAL HYSTERECTOMY    . APPENDECTOMY    . OOPHORECTOMY    . TONSILLECTOMY AND ADENOIDECTOMY Bilateral     Family History  Problem Relation Age of Onset  . Ovarian cancer Mother   . Pancreatic cancer Mother   . Bladder Cancer Neg Hx   . Kidney cancer Neg Hx   . Prostate cancer Neg Hx     Social History Social History  Substance Use Topics  . Smoking status: Current Every Day Smoker    Packs/day: 0.25    Types: Cigarettes  . Smokeless tobacco: Never Used  . Alcohol use No    Allergies  Allergen Reactions  . Prozac  [Fluoxetine] Other (See Comments)    Confusion  . Sulfa Antibiotics Nausea Only    Current Outpatient Prescriptions  Medication Sig Dispense Refill  . ALPRAZolam (XANAX) 1 MG tablet Take 1 tablet (1 mg total) by mouth 4 (four) times daily. 84 tablet 0  . aspirin EC 81 MG tablet Take 81 mg by mouth daily.    . bisacodyl (DULCOLAX) 5 MG EC tablet Take 5 mg by mouth daily as needed for moderate constipation.    . lovastatin (MEVACOR) 20 MG tablet Take 20 mg by mouth at bedtime.    Marland Kitchen tiZANidine (ZANAFLEX) 4 MG tablet Take 4 mg by mouth every 6 (six) hours as needed for muscle spasms.    . metoCLOPramide (REGLAN) 10 MG tablet Take 1 tablet (10 mg total) by mouth once. 2 tablet 0   No current facility-administered medications for this visit.     Review of Systems Review of Systems  Constitutional: Negative.   Respiratory: Negative.   Gastrointestinal: Positive for abdominal pain.    Blood pressure 116/68, pulse 74, resp. rate 12, height 5\' 6"  (1.676 m), weight 141 lb (64 kg).  Physical Exam Physical Exam  Constitutional: She is oriented to person, place, and time. She appears well-developed and well-nourished.  Eyes: Conjunctivae are normal. No scleral  icterus.  Cardiovascular: Normal rate, regular rhythm, normal heart sounds and intact distal pulses.   Pulmonary/Chest: Effort normal and breath sounds normal.  Abdominal: Soft. Bowel sounds are normal. There is tenderness (lower abdominal). A hernia (5 mm defect at umbilicus) is present.  Neurological: She is alert and oriented to person, place, and time.  Skin: Skin is warm and dry.    Data Reviewed Cardiology evaluation of 03/29/2017 put the patient except risk for endoscopy without additional testing. GI ( Dr. Shanon Rosser of 10/30/2016 reviewed. Recommendation for low fiber diet. Recommendation to avoid narcotics.  [facility] evaluation (Melissa Rich,MD) reviewed. Recommendation for hydrogen breath test. No results. Suggestion  for HIDA with CCK.  CT Angie of the abdomen and pelvis dated 04/22/2017 reviewed. Normal vasculature. Lumbar disc disease.  Assessment    Long-standing diffuse abdominal pain, progressive constipation. Mild weight loss.    Plan    Indications for upper and lower endoscopy reviewed.  The patient reported that she had no bowel movements after making use of a bottle of MiraLAX. This also resulted in severe nausea.     Plan to proceed with upper endoscopy and colonoscopy.  Patient to be on a liquid diet for 2 days prior to procedure.    Colonoscopy/upper endoscopy with possible biopsy/polypectomy prn: Information regarding the procedure, including its potential risks and complications (including but not limited to perforation of the bowel, which may require emergency surgery to repair, and bleeding) was verbally given to the patient. Educational information regarding lower intestinal endoscopy was given to the patient. Written instructions for how to complete the bowel prep using Miralax were provided. The importance of drinking ample fluids to avoid dehydration as a result of the prep emphasized.    HPI, Physical Exam, Assessment and Plan have been scribed under the direction and in the presence of Hervey Ard, MD.  Verlene Mayer, CMA   I have completed the exam and reviewed the above documentation for accuracy and completeness.  I agree with the above.  Haematologist has been used and any errors in dictation or transcription are unintentional.  Hervey Ard, M.D., F.A.C.S.  Robert Bellow 04/25/2017, 9:14 PM  Patient has been scheduled for an upper and lower endoscopy on 05-08-17 at [facility]. Patient has been asked to complete liquid diet for 2 days prior to procedure. In addition, she will complete a prep of 10 oz citrate magnesia on Monday, 05-06-17 and Tuesday, 05-07-17 instead of the regular Miralax prep. Patient instructed to take Reglan 10 mg tablet 30 minutes prior to  citrate magnesia. Colonoscopy instructions have been reviewed with the patient. This patient is aware to call the office if they have further questions.   Dominga Ferry, CMA

## 2017-04-25 DIAGNOSIS — R634 Abnormal weight loss: Secondary | ICD-10-CM | POA: Insufficient documentation

## 2017-04-30 ENCOUNTER — Encounter (INDEPENDENT_AMBULATORY_CARE_PROVIDER_SITE_OTHER): Payer: Self-pay | Admitting: Vascular Surgery

## 2017-04-30 ENCOUNTER — Ambulatory Visit (INDEPENDENT_AMBULATORY_CARE_PROVIDER_SITE_OTHER): Payer: Medicaid Other | Admitting: Vascular Surgery

## 2017-04-30 VITALS — BP 124/81 | HR 74 | Resp 16 | Wt 143.4 lb

## 2017-04-30 DIAGNOSIS — N280 Ischemia and infarction of kidney: Secondary | ICD-10-CM | POA: Diagnosis not present

## 2017-04-30 DIAGNOSIS — I700 Atherosclerosis of aorta: Secondary | ICD-10-CM

## 2017-04-30 DIAGNOSIS — R1013 Epigastric pain: Secondary | ICD-10-CM | POA: Diagnosis not present

## 2017-04-30 DIAGNOSIS — E7800 Pure hypercholesterolemia, unspecified: Secondary | ICD-10-CM

## 2017-04-30 NOTE — Assessment & Plan Note (Signed)
This appears healed on her CT scan. No renal artery stenosis was present. No further evaluation from a vascular standpoint is warranted.

## 2017-04-30 NOTE — Assessment & Plan Note (Signed)
Without significant stenosis on her CT scan. No intervention will be required. Consider repeat imaging in 2-3 years for further evaluation.

## 2017-04-30 NOTE — Assessment & Plan Note (Signed)
CT scan of the abdomen and pelvis with contrast which I have independently reviewed and gone over with her in great detail today. She does have diffuse scattered atherosclerosis without hemodynamically significant stenosis of the aorta, iliac arteries, or common femoral arteries. The scan stops in the proximal thigh so I cannot speak to her lower extremities beyond that. She does not really have claudication. She describes focal pain in the left medial thigh that is not related to activity. Also on her scan, her mesenteric vessels and renal arteries appear to have mild atherosclerosis without hemodynamically significant stenosis. 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 Essentially, from a vascular standpoint I find no cause of her abdominal pain or lower extremity symptoms. I will defer further workup of her abdominal pain and her gastroenterologist and hopefully her upcoming colonoscopy may shed some light on her symptoms.

## 2017-04-30 NOTE — Progress Notes (Signed)
MRN : [id number]  Patricia Rollins is a 56 y.o. ([birth year]) female who presents with chief complaint of  Chief Complaint  Patient presents with  . ct follow up  .  History of Present Illness: Patient returns today in follow up of her abdominal pain.  She is very frustrated that there has not been a cause of her abdominal pain found. She is scheduled to have a colonoscopy next week. I saw her several months ago when she had a small renal infarct. Her renal arteries did not demonstrate hemodynamically significant stenosis. She has undergone another CT scan of the abdomen and pelvis with contrast which I have independently reviewed and gone over with her in great detail today. She does have diffuse scattered atherosclerosis without hemodynamically significant stenosis of the aorta, iliac arteries, or common femoral arteries. The scan stops in the proximal thigh so I cannot speak to her lower extremities beyond that. She does not really have claudication. She describes focal pain in the left medial thigh that is not related to activity. Also on her scan, her mesenteric vessels and renal arteries appear to have mild atherosclerosis without hemodynamically significant stenosis. 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 Essentially, from a vascular standpoint I find no cause of her abdominal pain or lower extremity symptoms.       Past Medical History:  Diagnosis Date  . Anxiety   . Anxiety   . Arthritis    patient has bilateral bursitis in hips  . Depression   . Fibromyalgia   . Hematuria 06/21/2016  . Neuromuscular disorder [facility])          Past Surgical History:  Procedure Laterality Date  . ABDOMINAL HYSTERECTOMY    . APPENDECTOMY    . OOPHORECTOMY    . TONSILLECTOMY AND ADENOIDECTOMY Bilateral          Family History  Problem Relation Age of Onset  . Ovarian cancer Mother   . Pancreatic cancer Mother   .  Bladder Cancer Neg Hx   . Kidney cancer Neg Hx   . Prostate cancer Neg Hx   No bleeding or clotting disorders  Social History      Social History  Substance Use Topics  . Smoking status: Current Every Day Smoker    Packs/day: 0.25    Types: Cigarettes  . Smokeless tobacco: Never Used  . Alcohol use No  uses marijuana       Allergies  Allergen Reactions  . Prozac [Fluoxetine] Other (See Comments)    Confusion  . Sulfa Antibiotics Nausea Only          Current Outpatient Prescriptions  Medication Sig Dispense Refill  . ALPRAZolam (XANAX) 1 MG tablet Take 1 tablet (1 mg total) by mouth 4 (four) times daily. 84 tablet 0  . aspirin 325 MG tablet Take 325 mg by mouth daily.    Marland Kitchen conjugated estrogens (PREMARIN) vaginal cream Use 1/2 gram twice weekly 42.5 g 3  . lactulose (CEPHULAC) 10 g packet Take 1 packet (10 g total) by mouth 3 (three) times daily. 30 each 0  . metaxalone (SKELAXIN) 800 MG tablet Take 1 tablet (800 mg total) by mouth at bedtime as needed for muscle spasms. 30 tablet 0  . nicotine (NICOTROL) 10 MG inhaler 10 cartridges/day maximum, use frequent  puffing x 20 mins for each cartridge 420 each 0  . pregabalin (LYRICA) 25 MG capsule Take 1 capsule (25 mg total)  by mouth daily. 90 capsule 0  . promethazine (PHENERGAN) 12.5 MG tablet Take 1 tablet (12.5 mg total) by mouth every 6 (six) hours as needed for nausea or vomiting. 12 tablet 0  . sucralfate (CARAFATE) 1 g tablet Take 1 tablet (1 g total) by mouth 4 (four) times daily -  with meals and at bedtime. 90 tablet 2   No current facility-administered medications for this visit.       REVIEW OF SYSTEMS (Negative unless checked)  Constitutional: [] Weight loss  [] Fever  [] Chills Cardiac: [] Chest pain   [] Chest pressure   [] Palpitations   [] Shortness of breath when laying flat   [] Shortness of breath at rest   [] Shortness of breath with exertion. Vascular:  [] Pain in legs with walking   [x] Pain  in legs at rest   [] Pain in legs when laying flat   [] Claudication   [] Pain in feet when walking  [] Pain in feet at rest  [] Pain in feet when laying flat   [] History of DVT   [] Phlebitis   [] Swelling in legs   [] Varicose veins   [] Non-healing ulcers Pulmonary:   [] Uses home oxygen   [] Productive cough   [] Hemoptysis   [] Wheeze  [] COPD   [] Asthma Neurologic:  [] Dizziness  [] Blackouts   [] Seizures   [] History of stroke   [] History of TIA  [] Aphasia   [] Temporary blindness   [] Dysphagia   [] Weakness or numbness in arms   [] Weakness or numbness in legs Musculoskeletal:  [] Arthritis   [] Joint swelling   [] Joint pain   [] Low back pain Hematologic:  [] Easy bruising  [] Easy bleeding   [] Hypercoagulable state   [] Anemic  [] Hepatitis Gastrointestinal:  [x] Blood in stool   [] Vomiting blood  [x] Gastroesophageal reflux/heartburn   [x] Abdominal pain Genitourinary:  [] Chronic kidney disease   [] Difficult urination  [] Frequent urination  [] Burning with urination   [x] Hematuria Skin:  [] Rashes   [] Ulcers   [] Wounds Psychological:  [] History of anxiety   []  History of major depression.    Physical Examination  BP 124/81   Pulse 74   Resp 16   Wt 143 lb 6.4 oz (65 kg)   BMI 23.15 kg/m  Gen:  WD/WN, NAD Head: [place]/AT, No temporalis wasting. Ear/Nose/Throat: Hearing grossly intact, nares w/o erythema or drainage, trachea midline Eyes: Conjunctiva clear. Sclera non-icteric Neck: Supple.  No JVD.  Pulmonary:  Good air movement, no use of accessory muscles.  Cardiac: RRR, normal S1, S2 Vascular:  Vessel Right Left  Radial Palpable Palpable                                    Musculoskeletal: M/S 5/5 throughout.  No deformity or atrophy. No edema. Neurologic: Sensation grossly intact in extremities.  Symmetrical.  Speech is fluent.  Psychiatric: Judgment intact, Mood & affect appropriate for pt's clinical situation. Dermatologic: No rashes or ulcers noted.  No cellulitis or open  wounds.       Labs No results found for this or any previous visit (from the past 2160 hour(s)).  Radiology Ct Angio Abd/pel W/ And/or W/o  Result Date: 04/22/2017 CLINICAL DATA:  Generalized abdominal pain and chronic constipation. EXAM: CT ANGIOGRAPHY ABDOMEN AND PELVIS WITH CONTRAST AND WITHOUT CONTRAST TECHNIQUE: Multidetector CT imaging of the abdomen and pelvis was performed using the standard protocol during bolus administration of intravenous contrast. Multiplanar reconstructed images and MIPs were obtained and reviewed to evaluate the vascular anatomy. CONTRAST:  100 mL Isovue 370 IV COMPARISON:  CT of the abdomen and pelvis without contrast on 01/30/2017 and CT of the abdomen and pelvis with contrast on 07/31/2016. FINDINGS: VASCULAR Aorta: The abdominal aorta demonstrates scattered calcified plaque without evidence of aneurysmal disease or stenosis. Celiac: Normally patent. Branch vessels show normal branching anatomy and patency. SMA: Minimal plaque without evidence of stenosis. Distal branch vessels are well demonstrated and normally patent. Renals: Single bilateral renal arteries show normal patency. Minimal eccentric plaque in the proximal right renal artery is not causing significant stenosis. The right renal artery demonstrates early bifurcation. IMA: Normally patent. Inflow: Bilateral common iliac arteries demonstrate diffuse calcified plaque without significant stenosis or aneurysmal disease. Scattered plaque is present throughout the internal iliac arteries bilaterally. Proximal Outflow: The external iliac and common femoral arteries demonstrate mild plaque without stenosis. The femoral bifurcations are normally patent bilaterally. Veins: Venous phase imaging demonstrates normal patency of the portal vein, mesenteric veins and splenic vein. Review of the MIP images confirms the above findings. NON-VASCULAR Lower chest: No acute abnormality. Hepatobiliary: No focal liver abnormality  is seen. No gallstones, gallbladder wall thickening, or biliary dilatation. Pancreas: Unremarkable. No pancreatic ductal dilatation or surrounding inflammatory changes. Spleen: Normal in size without focal abnormality. Adrenals/Urinary Tract: Adrenal glands are unremarkable. Kidneys are normal, without renal calculi, focal lesion, or hydronephrosis. Bladder is unremarkable. Stomach/Bowel: Bowel shows no evidence of obstruction or inflammation. No focal lesions are identified. No evidence of free air or abnormal fluid collections. Lymphatic: No enlarged lymph nodes identified. Reproductive: Status post hysterectomy. No adnexal masses. Other: No abdominal wall hernia or abnormality. No abdominopelvic ascites. Musculoskeletal: Mild degenerative disc disease of the lumbar spine with some disc space narrowing present at L2-3. There is a mild anterolisthesis of L4 on L5 of approximately 5 mm. IMPRESSION: VASCULAR No evidence of mesenteric arterial occlusive disease. Aortic and iliac atherosclerosis without evidence of aneurysmal disease or significant obstructive disease. NON-VASCULAR No significant nonvascular abnormalities identified in the abdomen or pelvis. Lumbar degenerative disc disease present. Electronically Signed   By: Aletta Edouard M.D.   On: 04/22/2017 10:45      Assessment/Plan  Aortic atherosclerosis ([place]) Without significant stenosis on her CT scan. No intervention will be required. Consider repeat imaging in 2-3 years for further evaluation.  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 No renal artery stenosis was present. No further evaluation from a vascular standpoint is warranted.  Pure hypercholesterolemia lipid control important in reducing the progression of atherosclerotic disease. Continue statin therapy   Epigastric abdominal pain CT scan of the abdomen and pelvis with contrast which I have independently reviewed and gone over with her in great detail today. She  does have diffuse scattered atherosclerosis without hemodynamically significant stenosis of the aorta, iliac arteries, or common femoral arteries. The scan stops in the proximal thigh so I cannot speak to her lower extremities beyond that. She does not really have claudication. She describes focal pain in the left medial thigh that is not related to activity. Also on her scan, her mesenteric vessels and renal arteries appear to have mild atherosclerosis without hemodynamically significant stenosis. 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 Essentially, from a vascular standpoint I find no cause of her abdominal pain or lower extremity symptoms. I will defer further workup of her abdominal pain and her gastroenterologist and hopefully her upcoming colonoscopy may shed some light on her symptoms.    Leotis Pain, MD  04/30/2017 5:11 PM    This note was created with Dragon medical transcription system.  Any errors from dictation are purely unintentional

## 2017-04-30 NOTE — Assessment & Plan Note (Signed)
lipid control important in reducing the progression of atherosclerotic disease. Continue statin therapy  

## 2017-05-08 ENCOUNTER — Encounter
Admission: RE | Disposition: A | Discharge status: Home / Self Care | Payer: Self-pay | Source: Ambulatory Visit | Attending: General Surgery

## 2017-05-08 ENCOUNTER — Ambulatory Visit
Admission: RE | Admit: 2017-05-08 | Discharge: 2017-05-08 | Disposition: A | Discharge status: Home / Self Care | Payer: Medicaid Other | Source: Ambulatory Visit | Attending: General Surgery | Admitting: General Surgery

## 2017-05-08 ENCOUNTER — Encounter: Payer: Self-pay | Admitting: *Deleted

## 2017-05-08 ENCOUNTER — Ambulatory Visit: Payer: Medicaid Other | Admitting: Anesthesiology

## 2017-05-08 DIAGNOSIS — D124 Benign neoplasm of descending colon: Secondary | ICD-10-CM | POA: Insufficient documentation

## 2017-05-08 DIAGNOSIS — Z79899 Other long term (current) drug therapy: Secondary | ICD-10-CM | POA: Insufficient documentation

## 2017-05-08 DIAGNOSIS — F419 Anxiety disorder, unspecified: Secondary | ICD-10-CM | POA: Diagnosis not present

## 2017-05-08 DIAGNOSIS — F329 Major depressive disorder, single episode, unspecified: Secondary | ICD-10-CM | POA: Insufficient documentation

## 2017-05-08 DIAGNOSIS — I10 Essential (primary) hypertension: Secondary | ICD-10-CM | POA: Insufficient documentation

## 2017-05-08 DIAGNOSIS — K3189 Other diseases of stomach and duodenum: Secondary | ICD-10-CM | POA: Insufficient documentation

## 2017-05-08 DIAGNOSIS — K449 Diaphragmatic hernia without obstruction or gangrene: Secondary | ICD-10-CM | POA: Diagnosis not present

## 2017-05-08 DIAGNOSIS — K5909 Other constipation: Secondary | ICD-10-CM

## 2017-05-08 DIAGNOSIS — F17210 Nicotine dependence, cigarettes, uncomplicated: Secondary | ICD-10-CM | POA: Diagnosis not present

## 2017-05-08 DIAGNOSIS — Z7982 Long term (current) use of aspirin: Secondary | ICD-10-CM | POA: Insufficient documentation

## 2017-05-08 DIAGNOSIS — K2950 Unspecified chronic gastritis without bleeding: Secondary | ICD-10-CM | POA: Diagnosis not present

## 2017-05-08 DIAGNOSIS — M797 Fibromyalgia: Secondary | ICD-10-CM | POA: Insufficient documentation

## 2017-05-08 DIAGNOSIS — K635 Polyp of colon: Secondary | ICD-10-CM

## 2017-05-08 DIAGNOSIS — R1013 Epigastric pain: Secondary | ICD-10-CM | POA: Diagnosis not present

## 2017-05-08 HISTORY — PX: COLONOSCOPY WITH PROPOFOL: SHX5780

## 2017-05-08 HISTORY — DX: Polyp of colon: K63.5

## 2017-05-08 HISTORY — PX: ESOPHAGOGASTRODUODENOSCOPY (EGD) WITH PROPOFOL: SHX5813

## 2017-05-08 SURGERY — ESOPHAGOGASTRODUODENOSCOPY (EGD) WITH PROPOFOL
Anesthesia: General

## 2017-05-08 MED ORDER — PROPOFOL 10 MG/ML IV BOLUS
INTRAVENOUS | Status: DC | PRN
  Administered 2017-05-08: 30 mg via INTRAVENOUS
  Administered 2017-05-08: 20 mg via INTRAVENOUS

## 2017-05-08 MED ORDER — PROPOFOL 500 MG/50ML IV EMUL
INTRAVENOUS | Status: DC | PRN
  Administered 2017-05-08: 125 ug/kg/min via INTRAVENOUS

## 2017-05-08 MED ORDER — GLYCOPYRROLATE 0.2 MG/ML IJ SOLN
INTRAMUSCULAR | Status: DC | PRN
  Administered 2017-05-08: 0.2 mg via INTRAVENOUS

## 2017-05-08 MED ORDER — DEXMEDETOMIDINE HCL 200 MCG/2ML IV SOLN
INTRAVENOUS | Status: DC | PRN
  Administered 2017-05-08: 8 ug via INTRAVENOUS

## 2017-05-08 MED ORDER — PROPOFOL 500 MG/50ML IV EMUL
INTRAVENOUS | Status: AC
  Filled 2017-05-08: qty 50

## 2017-05-08 MED ORDER — SODIUM CHLORIDE 0.9 % IV SOLN
INTRAVENOUS | Status: DC
  Administered 2017-05-08: 09:00:00 via INTRAVENOUS

## 2017-05-08 NOTE — H&P (Signed)
Patient tolerated the moderate side prep, 2 days of clear liquids and 10 ounces of mag citrate on a 1 and a to prior the procedure well. Mild nausea but no vomiting. Reports stool is watery.  Patricia Rollins proceed with upper and lower endoscopy to assess for a source for her chronic abdominal pain and obstipation.

## 2017-05-08 NOTE — Op Note (Signed)
[facility] Gastroenterology Patient Name: Patricia Rollins Procedure Date: 05/08/2017 9:05 AM MRN: [id number] Account #: [id number] Date of Birth: Apr 08, 1961 Admit Type: Outpatient Age: 56 Room: [facility] ENDO ROOM 1 Gender: Female Note Status: Finalized Procedure:            Colonoscopy Indications:          Epigastric abdominal pain Providers:            Robert Bellow, MD Referring MD:         Otila Back. Manuella Ghazi (Referring MD) Medicines:            Monitored Anesthesia Care Complications:        No immediate complications. Procedure:            Pre-Anesthesia Assessment:                       - Prior to the procedure, a History and Physical was                        performed, and patient medications, allergies and                        sensitivities were reviewed. The patient's tolerance of                        previous anesthesia was reviewed.                       - The risks and benefits of the procedure and the                        sedation options and risks were discussed with the                        patient. All questions were answered and informed                        consent was obtained.                       After obtaining informed consent, the colonoscope was                        passed under direct vision. Throughout the procedure,                        the patient's blood pressure, pulse, and oxygen                        saturations were monitored continuously. The                        Colonoscope was introduced through the anus and                        advanced to the the cecum, identified by appendiceal                        orifice and ileocecal valve. The colonoscopy was  somewhat difficult due to significant looping.                        Successful completion of the procedure was aided by                        using manual pressure. The patient tolerated the                        procedure well. The  quality of the bowel preparation                        was excellent. Findings:      A 8 mm polyp was found in the descending colon. The polyp was sessile.       Biopsies were taken with a cold forceps for histology.      The retroflexed view of the distal rectum and anal verge was normal and       showed no anal or rectal abnormalities. Impression:           - One 8 mm polyp in the descending colon. Biopsied.                       - The distal rectum and anal verge are normal on                        retroflexion view. Recommendation:       - Return to endoscopist in 1 week. Procedure Code(s):    --- Professional ---                       [phone], Colonoscopy, flexible; with biopsy, single or                        multiple Diagnosis Code(s):    --- Professional ---                       R10.13, Epigastric pain                       D12.4, Benign neoplasm of descending colon CPT copyright 2016 American Medical Association. All rights reserved. The codes documented in this report are preliminary and upon coder review may  be revised to meet current compliance requirements. Robert Bellow, MD 05/08/2017 9:58:07 AM This report has been signed electronically. Number of Addenda: 0 Note Initiated On: 05/08/2017 9:05 AM Scope Withdrawal Time: 0 hours 8 minutes 54 seconds  Total Procedure Duration: 0 hours 22 minutes 26 seconds       [facility]

## 2017-05-08 NOTE — OR Nursing (Signed)
Pt. C/o severe llq pain . On assessment she stated she she had pain in that area and has had pain in the same place over a year. VItal signs remain stable, abdomen soft and no abject pain on palatation. DR Bary Castilla examined pt and oked for discharge.

## 2017-05-08 NOTE — Transfer of Care (Signed)
Immediate Anesthesia Transfer of Care Note  Patient: Patricia Rollins  Procedure(s) Performed: Procedure(s): ESOPHAGOGASTRODUODENOSCOPY (EGD) WITH PROPOFOL (N/A) COLONOSCOPY WITH PROPOFOL (N/A)  Patient Location: PACU and Endoscopy Unit  Anesthesia Type:General  Level of Consciousness: awake, alert  and oriented  Airway & Oxygen Therapy: Patient Spontanous Breathing and Patient connected to nasal cannula oxygen  Post-op Assessment: Report given to RN and Post -op Vital signs reviewed and stable  Post vital signs: Reviewed and stable  Last Vitals:  Vitals:   05/08/17 0836  BP: 139/85  Pulse: 94  Resp: 16  Temp: 36.3 C    Last Pain:  Vitals:   05/08/17 0836  TempSrc: Tympanic         Complications: No apparent anesthesia complications

## 2017-05-08 NOTE — Op Note (Signed)
[facility] Gastroenterology Patient Name: Patricia Rollins Procedure Date: 05/08/2017 9:06 AM MRN: [id number] Account #: [id number] Date of Birth: 07-Dec-1960 Admit Type: Outpatient Age: 56 Room: [facility] ENDO ROOM 1 Gender: Female Note Status: Finalized Procedure:            Upper GI endoscopy Indications:          Epigastric abdominal pain Providers:            Robert Bellow, MD Referring MD:         Otila Back. Manuella Ghazi (Referring MD) Medicines:            Monitored Anesthesia Care Complications:        No immediate complications. Procedure:            Pre-Anesthesia Assessment:                       - Prior to the procedure, a History and Physical was                        performed, and patient medications, allergies and                        sensitivities were reviewed. The patient's tolerance of                        previous anesthesia was reviewed.                       - The risks and benefits of the procedure and the                        sedation options and risks were discussed with the                        patient. All questions were answered and informed                        consent was obtained.                       After obtaining informed consent, the endoscope was                        passed under direct vision. Throughout the procedure,                        the patient's blood pressure, pulse, and oxygen                        saturations were monitored continuously. The Endoscope                        was introduced through the mouth, and advanced to the                        third part of duodenum. The upper GI endoscopy was                        accomplished without difficulty. The patient tolerated  the procedure well. Findings:      A small hiatal hernia was present. Biopsies obtained as patient is at       high risk for Barrett's changes.      Diffuse severely erythematous mucosa without bleeding was found in  the       cardia. Biopsies were taken with a cold forceps for histology.      Patchy mild inflammation characterized by erythema was found in the       prepyloric region of the stomach. Biopsies were taken with a cold       forceps for histology.      The examined duodenum was normal. Impression:           - Small hiatal hernia.                       - Erythematous mucosa in the cardia. Biopsied.                       - Chronic gastritis. Biopsied.                       - Normal examined duodenum. Recommendation:       - Perform a colonoscopy today. Procedure Code(s):    --- Professional ---                       [phone], Esophagogastroduodenoscopy, flexible, transoral;                        with biopsy, single or multiple Diagnosis Code(s):    --- Professional ---                       K44.9, Diaphragmatic hernia without obstruction or                        gangrene                       K31.89, Other diseases of stomach and duodenum                       K29.50, Unspecified chronic gastritis without bleeding                       R10.13, Epigastric pain CPT copyright 2016 American Medical Association. All rights reserved. The codes documented in this report are preliminary and upon coder review may  be revised to meet current compliance requirements. Robert Bellow, MD 05/08/2017 9:30:47 AM This report has been signed electronically. Number of Addenda: 0 Note Initiated On: 05/08/2017 9:06 AM      [facility]

## 2017-05-08 NOTE — Anesthesia Post-op Follow-up Note (Cosign Needed)
Anesthesia QCDR form completed.        

## 2017-05-08 NOTE — Anesthesia Postprocedure Evaluation (Signed)
Anesthesia Post Note  Patient: CALLE SCHADER  Procedure(s) Performed: Procedure(s) (LRB): ESOPHAGOGASTRODUODENOSCOPY (EGD) WITH PROPOFOL (N/A) COLONOSCOPY WITH PROPOFOL (N/A)  Patient location during evaluation: Endoscopy Anesthesia Type: General Level of consciousness: awake and alert and oriented Pain management: pain level controlled Vital Signs Assessment: post-procedure vital signs reviewed and stable Respiratory status: spontaneous breathing, nonlabored ventilation and respiratory function stable Cardiovascular status: blood pressure returned to baseline and stable Postop Assessment: no signs of nausea or vomiting Anesthetic complications: no     Last Vitals:  Vitals:   05/08/17 1040 05/08/17 1050  BP: 114/70 115/78  Pulse:  72  Resp:  (!) 22  Temp:      Last Pain:  Vitals:   05/08/17 0836  TempSrc: Tympanic                 Danisa Kopec

## 2017-05-08 NOTE — Anesthesia Preprocedure Evaluation (Signed)
Anesthesia Evaluation  Patient identified by MRN, date of birth, ID band Patient awake    Reviewed: Allergy & Precautions, NPO status , Patient's Chart, lab work & pertinent test results  History of Anesthesia Complications Negative for: history of anesthetic complications  Airway Mallampati: II  TM Distance: >3 FB Neck ROM: Full    Dental  (+) Poor Dentition   Pulmonary neg sleep apnea, neg COPD, Current Smoker,    breath sounds clear to auscultation- rhonchi (-) wheezing      Cardiovascular Exercise Tolerance: Good (-) hypertension+ Peripheral Vascular Disease  (-) CAD, (-) Past MI and (-) Cardiac Stents  Rhythm:Regular Rate:Normal - Systolic murmurs and - Diastolic murmurs    Neuro/Psych PSYCHIATRIC DISORDERS Anxiety Depression    GI/Hepatic negative GI ROS, Neg liver ROS,   Endo/Other  negative endocrine ROSneg diabetes  Renal/GU negative Renal ROS     Musculoskeletal  (+) Arthritis , Fibromyalgia -  Abdominal (+) - obese,   Peds  Hematology negative hematology ROS (+)   Anesthesia Other Findings Past Medical History: No date: Anxiety No date: Anxiety No date: Arthritis     Comment: patient has bilateral bursitis in hips No date: Depression No date: Fibromyalgia 06/21/2016: Hematuria No date: Neuromuscular disorder ([facility])   Reproductive/Obstetrics                             Anesthesia Physical Anesthesia Plan  ASA: II  Anesthesia Plan: General   Post-op Pain Management:    Induction: Intravenous  PONV Risk Score and Plan: 1 and Propofol  Airway Management Planned: Natural Airway  Additional Equipment:   Intra-op Plan:   Post-operative Plan:   Informed Consent: I have reviewed the patients History and Physical, chart, labs and discussed the procedure including the risks, benefits and alternatives for the proposed anesthesia with the patient or authorized  representative who has indicated his/her understanding and acceptance.   Dental advisory given  Plan Discussed with: CRNA and Anesthesiologist  Anesthesia Plan Comments:         Anesthesia Quick Evaluation

## 2017-05-09 ENCOUNTER — Encounter: Payer: Self-pay | Admitting: General Surgery

## 2017-05-09 LAB — SURGICAL PATHOLOGY

## 2017-05-09 NOTE — OR Nursing (Signed)
Talked with patient, some diarrhea some pain during night, instructed  Pt to call Dr Terri Piedra this am to discuss pain.  Pt concerned about d/c instruction says she does not have them though she has others sheets. Went over d/c insructions with patient once again verbally. She stated she understood instructions.

## 2017-05-10 ENCOUNTER — Telehealth: Payer: Self-pay

## 2017-05-10 ENCOUNTER — Other Ambulatory Visit: Payer: Self-pay

## 2017-05-10 ENCOUNTER — Ambulatory Visit: Payer: Medicaid Other | Admitting: Family Medicine

## 2017-05-10 MED ORDER — OMEPRAZOLE 20 MG PO CPDR: 20.0000 mg | DELAYED_RELEASE_CAPSULE | Freq: Two times a day (BID) | ORAL | 3 refills | Status: DC

## 2017-05-10 NOTE — Telephone Encounter (Signed)
Notified patient as instructed, patient pleased. Discussed follow-up appointments, patient agrees. Prescription has been sent into the patient's pharmacy. The patient has been placed in recalls. She will call and cancel her follow up appointment if feeling better early next week.

## 2017-05-10 NOTE — Telephone Encounter (Signed)
-----   Message from Robert Bellow, MD sent at 05/09/2017  4:38 PM EDT ----- Please notify the patient that the single polyp in her colon was benign but she should plan on a repeat exam in 5 years. Please place in recalls.  Biopsy showed that she does have esophagitis and mild gastritis. Please send an prescription for omeprazole 20 mg #60 one by mouth twice a day with 3 refills. I would like her to give a phone follow-up in one month with her progress.  If she is doing as well as I expect she will not need to keep her post procedure appointment scheduled for one-2 weeks from now. Thank you ----- Message ----- From: Interface, Lab In Three Zero One Sent: 05/09/2017   4:19 PM To: Robert Bellow, MD

## 2017-05-14 ENCOUNTER — Other Ambulatory Visit: Payer: Self-pay

## 2017-05-14 ENCOUNTER — Telehealth: Payer: Self-pay

## 2017-05-14 MED ORDER — SUCRALFATE 1 GM/10ML PO SUSP: 1.0000 g | Freq: Three times a day (TID) | ORAL | 0 refills | Status: DC

## 2017-05-14 NOTE — Telephone Encounter (Signed)
Patient states that she has not tried Carafate. Prescription has been sent into her pharmacy. She will call in 1 month with an update on how she is doing.

## 2017-05-14 NOTE — Telephone Encounter (Signed)
-----   Message from Robert Bellow, MD sent at 05/11/2017 10:46 AM EDT ----- Regarding: RE: Medication Contact patient and see if she has ever used Carafate liquid to coat her stomach. If not: send in rx for carafate liquid, 1 gm three times a day.  If she has used it, then send in RX for zantac, 150 mg twice a day, #30, with 6 refills.  ----- Message ----- From: Lesly Rubenstein, LPN Sent: 11/30/4172  12:19 PM To: Robert Bellow, MD Subject: Medication                                     Ndeye says that Omeprazole is the same medication she was prescribed over 6 months ago and it gave her terrible headaches. Then Dr Vicente Males switched her to Protonix and it gave her the same type of problem.

## 2017-05-16 ENCOUNTER — Encounter: Payer: Self-pay | Admitting: General Surgery

## 2017-05-16 ENCOUNTER — Ambulatory Visit (INDEPENDENT_AMBULATORY_CARE_PROVIDER_SITE_OTHER): Payer: Medicaid Other | Admitting: General Surgery

## 2017-05-16 VITALS — BP 114/70 | HR 88 | Resp 14 | Ht 66.0 in | Wt 145.0 lb

## 2017-05-16 DIAGNOSIS — R140 Abdominal distension (gaseous): Secondary | ICD-10-CM | POA: Diagnosis not present

## 2017-05-16 DIAGNOSIS — K2990 Gastroduodenitis, unspecified, without bleeding: Secondary | ICD-10-CM | POA: Diagnosis not present

## 2017-05-16 DIAGNOSIS — R109 Unspecified abdominal pain: Secondary | ICD-10-CM

## 2017-05-16 DIAGNOSIS — K2970 Gastritis, unspecified, without bleeding: Secondary | ICD-10-CM

## 2017-05-16 NOTE — Progress Notes (Signed)
Patient ID: Patricia Rollins, female   DOB: April 20, 1961, 56 y.o.   MRN: [id number]  Chief Complaint  Patient presents with  . Follow-up    HPI Patricia Rollins is a 56 y.o. female.  Here today for follow up upper and lower endoscopy done 05-08-17. She still has pressure between her shoulder blades with abdominal pain. No specific foods trigger the discomfort. She admits to pressure in the rectum area like she "needs to have a BM'. She states she is still having vaginal pain as well. She stopped the premarin Initiated by Dr. Marcelline Mates for vasomotor symptoms about one month ago and the vaginal condition did improve but is not gone. She has  had a return of her vasomotor symptoms with cessation of hormone replacement.  The patient reports she was seen by Dr. Amalia Hailey from GYN as well who had suggested that vaginal estrogens might be appropriate.  The patient reports that she had not of the present symptoms of abdominal bloating and pressure in the area between her shoulder blades prior to the initiation of the Premarin therapy.  The patient reports she is unable to eat hot (temperature) foods as this produces dyskinesia in the lower abdomen which radiates down her legs to her feet.  She denies any reflux, surprising in light of the significant gastritis evident in the gastric fundus.  She was originally on the Carafate tablet for 3 months with no relief (it caused constipation). She did pick up the Carafate suspension from pharmacy yesterday. Zantac, protonix and omeprazole causes headaches.  She denies heartburn, only experienced during pregnancy.  Weight gain of 4 pounds since 05-08-17.  She has seen Dr Jacquiline Doe at [place] and she has scheduled appointment 06-05-17.  HPI  Past Medical History:  Diagnosis Date  . Anxiety   . Anxiety   . Arthritis    patient has bilateral bursitis in hips  . Colon polyp 05/08/2017   Dr Bary Castilla  . Depression   . Fibromyalgia   . Hematuria 06/21/2016  .  Neuromuscular disorder [facility])     Past Surgical History:  Procedure Laterality Date  . ABDOMINAL HYSTERECTOMY    . APPENDECTOMY    . COLONOSCOPY WITH PROPOFOL N/A 05/08/2017   Procedure: COLONOSCOPY WITH PROPOFOL;  Surgeon: Robert Bellow, MD;  Location: [facility] ENDOSCOPY;  Service: Endoscopy;  Laterality: N/A;  . ESOPHAGOGASTRODUODENOSCOPY (EGD) WITH PROPOFOL N/A 05/08/2017   Procedure: ESOPHAGOGASTRODUODENOSCOPY (EGD) WITH PROPOFOL;  Surgeon: Robert Bellow, MD;  Location: [facility] ENDOSCOPY;  Service: Endoscopy;  Laterality: N/A;  . OOPHORECTOMY    . TONSILLECTOMY AND ADENOIDECTOMY Bilateral     Family History  Problem Relation Age of Onset  . Ovarian cancer Mother   . Pancreatic cancer Mother   . Bladder Cancer Neg Hx   . Kidney cancer Neg Hx   . Prostate cancer Neg Hx     Social History Social History  Substance Use Topics  . Smoking status: Current Every Day Smoker    Packs/day: 0.25    Types: Cigarettes  . Smokeless tobacco: Never Used  . Alcohol use No    Allergies  Allergen Reactions  . Sulfa Antibiotics Nausea Only    Current Outpatient Prescriptions  Medication Sig Dispense Refill  . ALPRAZolam (XANAX) 1 MG tablet Take 1 tablet (1 mg total) by mouth 4 (four) times daily. 84 tablet 0  . aspirin EC 81 MG tablet Take 81 mg by mouth daily.    . bisacodyl (DULCOLAX) 5 MG EC tablet  Take 5 mg by mouth daily as needed for moderate constipation.    . lovastatin (MEVACOR) 20 MG tablet Take 20 mg by mouth at bedtime.    . sucralfate (CARAFATE) 1 g tablet Take 1 g by mouth 4 (four) times daily -  with meals and at bedtime.    . sucralfate (CARAFATE) 1 GM/10ML suspension Take 10 mLs (1 g total) by mouth 3 (three) times daily. 420 mL 0  . tiZANidine (ZANAFLEX) 4 MG tablet Take 4 mg by mouth every 6 (six) hours as needed for muscle spasms.     No current facility-administered medications for this visit.     Review of Systems Review of Systems  Constitutional: Negative.    Respiratory: Negative.   Cardiovascular: Negative.   Gastrointestinal: Positive for abdominal pain.    Blood pressure 114/70, pulse 88, resp. rate 14, height 5\' 6"  (1.676 m), weight 145 lb (65.8 kg).  Physical Exam Physical Exam  Constitutional: She is oriented to person, place, and time. She appears well-developed and well-nourished.  Neurological: She is alert and oriented to person, place, and time.  Skin: Skin is warm and dry.  Psychiatric: Her behavior is normal.    Data Reviewed DIAGNOSIS:  A. STOMACH, PREPYLORIC AREA; COLD BIOPSY:  - MINIMAL CHRONIC GASTRITIS AND FEATURES OF A HEALING MUCOSAL INJURY.  - NEGATIVE FOR H. PYLORI, DYSPLASIA AND MALIGNANCY.   B. STOMACH, HIATAL HERNIA AREA; COLD BIOPSY:  - MILD CHRONIC GASTRITIS AND FEATURES OF A HEALING MUCOSAL INJURY.  - NEGATIVE FOR H. PYLORI, DYSPLASIA AND MALIGNANCY.   C. DISTAL ESOPHAGUS, 35 CM; COLD BIOPSY:  - ACUTE ESOPHAGITIS.  - NEGATIVE FOR DYSPLASIA AND MALIGNANCY. Negative of visual inspection.   D. COLON POLYP, DESCENDING; COLD BIOPSY: 0.3 cm. - TUBULAR ADENOMA.  - NEGATIVE FOR HIGH-GRADE DYSPLASIA AND MALIGNANCY.    Assessment    Colonic polyp, five-year follow-up recommended.  Biopsy-proven esophagitis with no visible correlate and no symptoms of reflux or esophagitis.  Intrascapular pressure, possibly related to esophageal dysmotility.  Report of abdominal bloating.    Plan    With the most prominent mucosal abnormality being in the small hiatal hernia, I recommended a week trial of Carafate liquid to see if this provides any relief for the retrosternal discomfort.  She has been unable to tolerate Zantac, omeprazole or Protonix. If the Carafate is not a benefit a trial of Tagamet may be appropriate.  The patient has scheduled herself a follow-up with the [facility] GI specialist, and I think this will be helpful as hers is a very perplexing case.  She was encouraged to bring the previously  provided color copies from her recent upper and lower endoscopy to that appointment for Dr. Denice Paradise to review.    Recommend trying the suspension Carafate for the coating effects. Call our office with a status report in one week. May consider tagamet or prevacid. Colonoscopy in 5 years    HPI, Physical Exam, Assessment and Plan have been scribed under the direction and in the presence of Robert Bellow, MD. Karie Fetch, RN  I have completed the exam and reviewed the above documentation for accuracy and completeness.  I agree with the above.  Haematologist has been used and any errors in dictation or transcription are unintentional.  Hervey Ard, M.D., F.A.C.S.  Robert Bellow 05/16/2017, 9:39 PM

## 2017-05-16 NOTE — Patient Instructions (Addendum)
The patient is aware to call back for any questions or concerns. Recommend trying the suspension Carafate for the coating effects. Call our office with a status report in one week. colonoscopy in 5 years

## 2017-05-22 ENCOUNTER — Telehealth: Payer: Self-pay | Admitting: *Deleted

## 2017-05-22 NOTE — Telephone Encounter (Signed)
She called for her phone follow up. She wanted Dr Bary Castilla to know that the liquid Carafate has not helped. She states she can not tolerate taking it any more. She is still having the pressure and abdominal pain. She states she ate an activa yogurt last night and her stomach started hurting worse. She has eaten these before about 1-2 months ago and can't remember if it caused this pain. Aware we will be back in touch with her.

## 2017-05-23 ENCOUNTER — Telehealth: Payer: Self-pay | Admitting: *Deleted

## 2017-05-23 MED ORDER — CIMETIDINE 400 MG PO TABS: 400.0000 mg | ORAL_TABLET | Freq: Two times a day (BID) | ORAL | 0 refills | Status: DC

## 2017-05-23 MED ORDER — DICYCLOMINE HCL 20 MG PO TABS: 20.0000 mg | ORAL_TABLET | Freq: Three times a day (TID) | ORAL | 0 refills | Status: DC

## 2017-05-23 MED ORDER — FAMOTIDINE 40 MG PO TABS: 40.0000 mg | ORAL_TABLET | Freq: Two times a day (BID) | ORAL | 0 refills | Status: DC

## 2017-05-23 NOTE — Telephone Encounter (Signed)
RX sent as instructed, pt aware. She will keep track of response to meds so she can inform the physician at her GI consult as well.

## 2017-05-23 NOTE — Telephone Encounter (Signed)
Tagamet not available Pepcid 40 mg BID RX sent.

## 2017-05-23 NOTE — Addendum Note (Signed)
Addended by: Carson Myrtle on: 05/23/2017 08:43 AM   Modules accepted: Orders

## 2017-05-23 NOTE — Telephone Encounter (Addendum)
Review of the record shows the patient has had a CT angiogram including the chest and abdomen ruling out mesenteric ischemia. No intrathoracic or abdominal lesions to account for reported retrosternal pressure. Prior abdominal ultrasound was negative for gallstones.  Patient has reported allergic response to most PPIs.  Carafate has not been beneficial for the esophagitis present on biopsy or the gastritis evident on endoscopy.  Pending her GI second opinion on 06/05/2017 the patient will be asked to make use cimetidine 400 mg twice a day.  Will add Bentyl 20 mg QID to address possible esophageal spasm.   Continue Carafate tablets if tolerated.

## 2017-05-24 ENCOUNTER — Encounter: Payer: Self-pay | Admitting: Family Medicine

## 2017-05-24 ENCOUNTER — Ambulatory Visit (INDEPENDENT_AMBULATORY_CARE_PROVIDER_SITE_OTHER): Payer: Medicaid Other | Admitting: Family Medicine

## 2017-05-24 VITALS — BP 118/75 | HR 101 | Temp 97.9°F | Resp 17 | Ht 66.0 in | Wt 144.9 lb

## 2017-05-24 DIAGNOSIS — L989 Disorder of the skin and subcutaneous tissue, unspecified: Secondary | ICD-10-CM | POA: Diagnosis not present

## 2017-05-24 DIAGNOSIS — K2990 Gastroduodenitis, unspecified, without bleeding: Secondary | ICD-10-CM

## 2017-05-24 MED ORDER — MUPIROCIN 2 % EX OINT: 1.0000 "application " | TOPICAL_OINTMENT | Freq: Two times a day (BID) | CUTANEOUS | 0 refills | Status: DC

## 2017-05-24 NOTE — Progress Notes (Signed)
Name: Patricia Rollins   MRN: [id number]    DOB: August 28, 1961   Date:05/24/2017       Progress Note  Subjective  Chief Complaint  Chief Complaint  Patient presents with  . Allergic Reaction    swelling in throat, bumps, and itching x1 mo    HPI  Pt. Presents with concern for multiple skin lesions (present on her neck, left and right arms, abdomen, and legs), these are described as raised pink colored pruritic lesions. These lesions resolve and then come back, she has applied cortisone cream with some relief. She believes these lesions are caused by a food allergy.    Past Medical History:  Diagnosis Date  . Anxiety   . Anxiety   . Arthritis    patient has bilateral bursitis in hips  . Colon polyp 05/08/2017   Dr Bary Castilla  . Depression   . Fibromyalgia   . Hematuria 06/21/2016  . Neuromuscular disorder [facility])     Past Surgical History:  Procedure Laterality Date  . ABDOMINAL HYSTERECTOMY    . APPENDECTOMY    . COLONOSCOPY WITH PROPOFOL N/A 05/08/2017   Procedure: COLONOSCOPY WITH PROPOFOL;  Surgeon: Robert Bellow, MD;  Location: [facility] ENDOSCOPY;  Service: Endoscopy;  Laterality: N/A;  . ESOPHAGOGASTRODUODENOSCOPY (EGD) WITH PROPOFOL N/A 05/08/2017   Procedure: ESOPHAGOGASTRODUODENOSCOPY (EGD) WITH PROPOFOL;  Surgeon: Robert Bellow, MD;  Location: [facility] ENDOSCOPY;  Service: Endoscopy;  Laterality: N/A;  . OOPHORECTOMY    . TONSILLECTOMY AND ADENOIDECTOMY Bilateral     Family History  Problem Relation Age of Onset  . Ovarian cancer Mother   . Pancreatic cancer Mother   . Bladder Cancer Neg Hx   . Kidney cancer Neg Hx   . Prostate cancer Neg Hx     Social History   Social History  . Marital status: Single    Spouse name: N/A  . Number of children: N/A  . Years of education: N/A   Occupational History  . Not on file.   Social History Main Topics  . Smoking status: Current Every Day Smoker    Packs/day: 0.25    Types: Cigarettes  . Smokeless tobacco: Never  Used  . Alcohol use No  . Drug use: No  . Sexual activity: No   Other Topics Concern  . Not on file   Social History Narrative  . No narrative on file     Current Outpatient Prescriptions:  .  ALPRAZolam (XANAX) 1 MG tablet, Take 1 tablet (1 mg total) by mouth 4 (four) times daily., Disp: 84 tablet, Rfl: 0 .  dicyclomine (BENTYL) 20 MG tablet, Take 1 tablet (20 mg total) by mouth 4 (four) times daily -  before meals and at bedtime., Disp: 40 tablet, Rfl: 0 .  famotidine (PEPCID) 40 MG tablet, Take 1 tablet (40 mg total) by mouth 2 (two) times daily., Disp: 60 tablet, Rfl: 0 .  lovastatin (MEVACOR) 20 MG tablet, Take 20 mg by mouth at bedtime., Disp: , Rfl:  .  tiZANidine (ZANAFLEX) 4 MG tablet, Take 4 mg by mouth every 6 (six) hours as needed for muscle spasms., Disp: , Rfl:  .  aspirin EC 81 MG tablet, Take 81 mg by mouth daily., Disp: , Rfl:  .  bisacodyl (DULCOLAX) 5 MG EC tablet, Take 5 mg by mouth daily as needed for moderate constipation., Disp: , Rfl:  .  cimetidine (TAGAMET) 400 MG tablet, Take 1 tablet (400 mg total) by mouth 2 (two) times daily. (Patient  not taking: Reported on 05/24/2017), Disp: 60 tablet, Rfl: 0 .  sucralfate (CARAFATE) 1 g tablet, Take 1 g by mouth 4 (four) times daily -  with meals and at bedtime., Disp: , Rfl:  .  sucralfate (CARAFATE) 1 GM/10ML suspension, Take 10 mLs (1 g total) by mouth 3 (three) times daily. (Patient not taking: Reported on 05/24/2017), Disp: 420 mL, Rfl: 0  Allergies  Allergen Reactions  . Sulfa Antibiotics Nausea Only     ROS  Please see history of present illness for complete discussion of ROS  Objective  Vitals:   05/24/17 1150  BP: 118/75  Pulse: (!) 101  Resp: 17  Temp: 97.9 F (36.6 C)  TempSrc: Oral  SpO2: 96%  Weight: 144 lb 14.4 oz (65.7 kg)  Height: 5\' 6"  (1.676 m)    Physical Exam  Constitutional: She is well-developed, well-nourished, and in no distress.  Neck:    Cardiovascular: Normal rate,  regular rhythm and normal heart sounds.   No murmur heard. Pulmonary/Chest: Effort normal and breath sounds normal. She has no wheezes.  Musculoskeletal:       Arms: Skin: Lesion and rash noted. Rash is maculopapular.  maculo-papular, erythematous, pruritic well defined lesions on the anterior neck and left lateral upper back  Psychiatric: Mood, memory, affect and judgment normal.  Nursing note and vitals reviewed.      Assessment & Plan  1. Skin lesions Suspect localized skin infection, DC OTC steroid cream and start on Bactroban, if symptoms persist, consider referral to dermatology - mupirocin ointment (BACTROBAN) 2 %; Apply 1 application topically 2 (two) times daily.  Dispense: 22 g; Refill: 0  2. Gastritis and duodenitis Reviewed Dr. Dwyane Luo notes, she is now on Pepcid 40 mg twice a day, has used Carafate in the past, will follow-up with GI   Patricia Rollins [facility] 05/24/2017 11:56 AM

## 2017-05-30 ENCOUNTER — Ambulatory Visit (INDEPENDENT_AMBULATORY_CARE_PROVIDER_SITE_OTHER): Payer: Medicaid Other | Admitting: Obstetrics and Gynecology

## 2017-05-30 VITALS — BP 117/84 | HR 82 | Ht 66.0 in | Wt 145.1 lb

## 2017-05-30 DIAGNOSIS — R1030 Lower abdominal pain, unspecified: Secondary | ICD-10-CM

## 2017-05-30 DIAGNOSIS — R102 Pelvic and perineal pain: Secondary | ICD-10-CM | POA: Diagnosis not present

## 2017-05-30 DIAGNOSIS — N952 Postmenopausal atrophic vaginitis: Secondary | ICD-10-CM | POA: Diagnosis not present

## 2017-05-30 DIAGNOSIS — N951 Menopausal and female climacteric states: Secondary | ICD-10-CM

## 2017-05-30 LAB — POCT URINALYSIS DIPSTICK
Bilirubin, UA: NEGATIVE
GLUCOSE UA: NEGATIVE
Ketones, UA: NEGATIVE
Leukocytes, UA: NEGATIVE
NITRITE UA: NEGATIVE
PH UA: 8 (ref 5.0–8.0)
Protein, UA: NEGATIVE
Urobilinogen, UA: 0.2 E.U./dL

## 2017-05-30 MED ORDER — ESTROGENS, CONJUGATED 0.625 MG/GM VA CREA: 1.0000 | TOPICAL_CREAM | VAGINAL | 12 refills | Status: DC

## 2017-05-30 MED ORDER — ESTROGENS CONJUGATED 0.45 MG PO TABS: 0.4500 mg | ORAL_TABLET | Freq: Every day | ORAL | 11 refills | Status: DC

## 2017-05-30 NOTE — Progress Notes (Signed)
GYNECOLOGY PROGRESS NOTE  Subjective:    Patient ID: GLENDER AUGUSTA, female    DOB: 12-01-1960, 56 y.o.   MRN: [id number]  HPI  Patient is a 56 y.o. G12P3003 female who presents for complaints of abdominal pain and vaginal pain and swelling.  She has had these complaints for several months now, and despite negative imaging (has had several CT scans, as well as an ultrasound), and a negative workup (including a recent colonoscopy and endoscopy, only noting a small hiatal hernia), she still notes complaints.   Of note, she states that she did discontinue her Premarin tablets to see if this was the cause of her pain (as she noted the onset of symptoms around the same time she began the medication. States that it helped for a short while, however she is still experiencing the pain, as well as now dealing with hot flushes and night sweats. Desires to resume medication.   The following portions of the patient's history were reviewed and updated as appropriate: allergies, current medications, past family history, past medical history, past social history, past surgical history and problem list.  Review of Systems Pertinent items noted in HPI and remainder of comprehensive ROS otherwise negative.   Objective:   Blood pressure 117/84, pulse 82, height 5\' 6"  (1.676 m), weight 145 lb 1.6 oz (65.8 kg). General appearance: alert and no distress Abdomen: soft, non-tender; bowel sounds normal; no masses,  no organomegaly Pelvic: external genitalia normal, rectovaginal septum normal.  Vagina without discharge. Mild vaginal atrophy noted.  Tenderness along bladder neck and right vaginal sidewall. No lesions visible or palpable. Cervix and uterus surgically absent.  Adnexae surgically absent.  Extremities: extremities normal, atraumatic, no cyanosis or edema Neurologic: Grossly normal   Labs:  Results for orders placed or performed in visit on 05/30/17  POCT urinalysis dipstick  Result Value Ref Range     Color, UA clear    Clarity, UA clear    Glucose, UA neg    Bilirubin, UA neg    Ketones, UA neg    Spec Grav, UA <=1.005 (A) 1.010 - 1.025   Blood, UA MODERATE    pH, UA 8.0 5.0 - 8.0   Protein, UA neg    Urobilinogen, UA 0.2 0.2 or 1.0 E.U./dL   Nitrite, UA neg    Leukocytes, UA Negative Negative      Assessment:   Vaginal pain Vaginal atrophy Abdominal pain Menopausal symptoms  Plan:   Vaginal pain may be secondary to vaginal atrophy.  Discussed that she would likely benefit from vaginal estrogen therapy. She discontinued her HRT, however is now also noting symptoms.  States she did try some OTC remedies without relief.  Advised that we can begin her on a vaginal regimen with premarin cream, and reduce her oral HRT medication dosage, or change to a different non-HRT oral regimen altogether (such as Brisdelle, Clonidine, Effexor).  Patient would like to continue on HRT, but at a lower dose. Will change to 0.45 mg dosage (down from 0.625 mg).  Will also prescribe Premarin cream for short term use until vaginal atrophy improves.  Advised that if pain continues, may need to perform a colposcopy with biopsy to r/o other potential causes.  Patient currently declines referral to physical therapy.  - Abdominal pain with unknown cause.  Negative workup. However with further discussion patient notes the pain when she is feeling stressed or overwhelmed, or when she "overdoes it physically".  Also notes this as well  with her vaginal pain. Possibly pain is psychosomatic.  Discussed possibility with patient.  - Patient desires UA to be performed today to r/o UTI.  Blood noted in urine but otherwise negative. Will order culture.  - F/u in 2 weeks to reassess symptoms  Rubie Maid, MD [facility]

## 2017-05-31 ENCOUNTER — Telehealth: Payer: Self-pay | Admitting: Obstetrics and Gynecology

## 2017-05-31 NOTE — Telephone Encounter (Signed)
Please call patient with results of tests for UTI

## 2017-05-31 NOTE — Telephone Encounter (Signed)
Called pt informed her that urine culture typically takes 72 hours and is not back yet. Pt gave verbal understanding.

## 2017-06-02 LAB — URINE CULTURE

## 2017-06-05 ENCOUNTER — Telehealth: Payer: Self-pay | Admitting: Obstetrics and Gynecology

## 2017-06-05 NOTE — Telephone Encounter (Signed)
Informed pt of urine culture results. Pt is also questioning the directions for use of HRT. States she has been taking it nonstop since Oct. Directions on most recent bottle states 21 days on 7 days off and she is confused. Please call pt to clarify.

## 2017-06-05 NOTE — Telephone Encounter (Signed)
Please call patient about her urine results   Thank you

## 2017-06-11 NOTE — Telephone Encounter (Signed)
Please inform patient that as long as she has an intact uterus (no prior history of hysterectomy), she should have a short rest between her estrogen doses each month (21 days on/7 days off), or she can continue to use daily, but will also need to take a dose of progesterone with it (can be prescribed as a combined pill).  This is because of long term exposure to estrogen therapy does increase slightly the risk of endometrial cancer.  The rest allows time for the tissues to get a break, or the progesterone therapy added allows for continuous use and acts as a buffer to the continuous estrogen. I am especially trying to be cautious as she is also going to be using the Premarin cream in addition to the tablets.  I can prescribe it either way, whatever she would like.Marland Kitchen

## 2017-06-11 NOTE — Telephone Encounter (Signed)
Spoke with pt- she states she had a hysterectomy 20 years ago and she thought provider knew that. Also states she has an appointment later this week and she will discuss this with provider then.

## 2017-06-13 ENCOUNTER — Encounter: Payer: Medicaid Other | Admitting: Obstetrics and Gynecology

## 2017-06-21 ENCOUNTER — Encounter: Payer: Medicaid Other | Admitting: Obstetrics and Gynecology

## 2017-06-21 ENCOUNTER — Telehealth: Payer: Self-pay | Admitting: Obstetrics and Gynecology

## 2017-06-21 NOTE — Telephone Encounter (Signed)
Please advise 

## 2017-06-21 NOTE — Telephone Encounter (Signed)
Patient has 2 bottles of Premarin medication and she needs confirmation on the correct.  Premarin 0.625 mg instructions are - Take one tablet each day   Premarin 0.45mg  instructions are - Take one tablet by mouth daily. Take daily for 21 days and then do not take for 7 days.  Patient also does the Premarin cream @ night 1/2 gram   Please call ASAP - she has been given instructions by 2 different nurses to do 2 different things and now she's unsure of what is correct.

## 2017-06-21 NOTE — Telephone Encounter (Signed)
Called pt she states that the Premarin 0.45mg  "did not agree with her" and therefore she has resumed Premarin 0.625mg . Pt also notes that she is not sure why she needs to take a break from estrogen as she had a hysterectomy 49yrs ago. Advised pt to continue taking Premarin 0.625mg  until her appt with Dr.Cherry this coming week at which time they can discuss further.

## 2017-06-21 NOTE — Telephone Encounter (Signed)
As I have explained to her before, she should no longer be taking the Premarin 0.625 mg. That is her old prescription.  She should be taking the 0.45 mg tablets daily.  She should stop the medication for 7 days each month (this is to protect the uterus from over exposure to estrogen as she is also to be using the Premarin cream as prescribed 2-3 times weekly).  If she still desires to use the estrogen pills every day due to difficulty remembering to stop for 7 days, then I can prescribe another pill for her to take (prometrium for 1-2 weeks out of each month) for protection of her uterus against endometrial cancer from being on the estrogen.

## 2017-06-24 ENCOUNTER — Telehealth: Payer: Self-pay | Admitting: Obstetrics and Gynecology

## 2017-06-24 NOTE — Telephone Encounter (Signed)
Contacted patient regarding clarification of her current prescriptions.  Patient is no longer taking her Premarin 0.45 mg due to it being less effective, and has now changed back to her 0.625 mg prescription. Is currently taking it daily.  Is also using her Premarin cream 2-3 times weekly.  Discussed with patient that as she has increased her estrogen use (now taking higher dose tablets and using cream), I would recommend taking periodic breaks from the tablets (i.e. Taking the 0.625 mg dosing every other day, or 21 days on, 7 days off), to decrease estrogen exposure and lower risks associated with long term use of estrogen therapy (especially at higher dose), including breast cancer.  Other alternative was to add progesterone for 7 days each month.  Patient notes that she will try the 21 days on, 7 days off method.

## 2017-06-27 ENCOUNTER — Ambulatory Visit (INDEPENDENT_AMBULATORY_CARE_PROVIDER_SITE_OTHER): Payer: Medicaid Other | Admitting: Obstetrics and Gynecology

## 2017-06-27 VITALS — BP 112/79 | HR 93 | Wt 148.6 lb

## 2017-06-27 DIAGNOSIS — R102 Pelvic and perineal pain: Secondary | ICD-10-CM | POA: Diagnosis not present

## 2017-06-27 DIAGNOSIS — N952 Postmenopausal atrophic vaginitis: Secondary | ICD-10-CM

## 2017-06-27 DIAGNOSIS — M797 Fibromyalgia: Secondary | ICD-10-CM

## 2017-06-27 DIAGNOSIS — R1013 Epigastric pain: Secondary | ICD-10-CM

## 2017-06-27 DIAGNOSIS — N951 Menopausal and female climacteric states: Secondary | ICD-10-CM

## 2017-06-27 MED ORDER — ESTROGENS CONJUGATED 0.625 MG PO TABS: 0.6250 mg | ORAL_TABLET | Freq: Every day | ORAL | 11 refills | Status: DC

## 2017-06-27 NOTE — Progress Notes (Signed)
    GYNECOLOGY PROGRESS NOTE  Subjective:    Patient ID: Patricia Rollins, female    DOB: 05/30/1961, 56 y.o.   MRN: [id number]  HPI  Patient is a 56 y.o. G67P3003 female who presents for f/u of vaginal pain, vaginal atrophy and vasomotor symptoms. Notes that she took the Premarin 0.45 mg tablets, but was having nausea associated with it, so discontinued and went back to taking her 0.625 mg. Has been using her estrogen cream but notes no relief in vaginal discomfort and pain. Patient also notes confusion regarding the instructions on taking her medications.    She also continues to note abdominal pain (despite negative workup including colonoscopy and endoscopy).  And is also noting pain between her shoulder blades.   The following portions of the patient's history were reviewed and updated as appropriate: allergies, current medications, past family history, past medical history, past social history, past surgical history and problem list.  Review of Systems Pertinent items noted in HPI and remainder of comprehensive ROS otherwise negative.    Objective:   Blood pressure 112/79, pulse 93, weight 148 lb 9 oz (67.4 kg). General appearance: alert and no distress Exam deferred today.    Assessment:   Multiple pain sites Vaginal atrophy Menopausal symptoms  Plan:   - Discussed that optimal results for vaginal atrophy are usually not seen with estrogen vaginal therapy until 12 weeks after initiation.  Will have patient f/u at the end of that time to reassess symptoms. Patient will f/u after 12 weeks of treatment to reassess symptoms. Discussed that if still experiencing pain, will perform vaginal biopsy to assess for other causes.  - Patient to continue using Premarin tablets at current dose.  Discussed directions for use again, typed instructions to be given to patient, as she has called the office several times noting confusion with her medication directions.  - Patient inquires as to if  she needs her hormone levels drawn. Discussed that there may be little information gathered from them that may help her current symptoms, however if patient would like to consider alternative hormonal (i.e. Bioidentical management), then can give information to a location in [place] that may be able to help [facility] MD).  - Reviewed patient's history and symptoms, noted that patient has a h/o fibromyalgia, notes that she has not been on her medication for several months, coinciding with the timing of noting several of these vague pain symptoms.  Advised patient to f/u with PCP to discuss resumption of medications to see if this may be helpful for her symptoms.  Patient notes she will contact PCP.    A total of 15 minutes were spent face-to-face with the patient during this encounter and over half of that time dealt with counseling and coordination of care.   Rubie Maid, MD [facility]  Back, abdomen, and vaginal pain with negative workup

## 2017-06-27 NOTE — Progress Notes (Signed)
Pt is here for a followup on Premarin.Still having vaginal pain and stomach pain.Is wondering if there is a blood test that can be done to check hormones.

## 2017-06-27 NOTE — Patient Instructions (Signed)
1. Continue using Premarin 0.625 mg cream 2-3 times weekly, using 1/2 gram. If no relief by 3 months, can discontinue use of cream.  2. Continue Premarin 0.625 tablets for 21 days, with 7 day break.  If this does not help, can start taking 1 tablet every other day with no days off.  3. Contact PCP to follow up with fibromyalgia.  4. If no change in symptoms after 3 months, will perform vaginal biopsy to rule out other causes of pain.

## 2017-06-28 ENCOUNTER — Ambulatory Visit: Payer: Medicaid Other | Admitting: Family Medicine

## 2017-06-28 DIAGNOSIS — N952 Postmenopausal atrophic vaginitis: Secondary | ICD-10-CM | POA: Insufficient documentation

## 2017-07-01 ENCOUNTER — Telehealth: Payer: Self-pay | Admitting: Obstetrics and Gynecology

## 2017-07-01 NOTE — Telephone Encounter (Signed)
Patient wants the referral to a PCP - but she would like a call to know who you're referring her to. No referral in note and no referral entered on appointment desk.  I encouraged her to call around to PCP offices and ask for a provider that takes Medicaid and treats fibromyalgia.  She insists that you refer her.  Please call

## 2017-07-03 ENCOUNTER — Telehealth: Payer: Self-pay | Admitting: Obstetrics and Gynecology

## 2017-07-03 ENCOUNTER — Encounter: Payer: Self-pay | Admitting: Family Medicine

## 2017-07-03 ENCOUNTER — Ambulatory Visit (INDEPENDENT_AMBULATORY_CARE_PROVIDER_SITE_OTHER): Payer: Medicaid Other | Admitting: Family Medicine

## 2017-07-03 VITALS — BP 130/90 | HR 85 | Temp 97.3°F | Wt 150.9 lb

## 2017-07-03 DIAGNOSIS — M797 Fibromyalgia: Secondary | ICD-10-CM

## 2017-07-03 DIAGNOSIS — Z7689 Persons encountering health services in other specified circumstances: Secondary | ICD-10-CM

## 2017-07-03 NOTE — Progress Notes (Signed)
Name: Patricia Rollins   MRN: [id number]    DOB: Nov 03, 1961   Date:07/03/2017       Progress Note  Subjective  Chief Complaint  Chief Complaint  Patient presents with  . Fibromyalgia    HPI  Fibromyalgia: She has long-standing history of fibromyalgia, being treated with opioids in the past. She describes her symptoms as generalized pain (starting between her shoulder blades and radiating down to the lower back into her feet), and depression, she also feels fatigued. She has been treated in the past with Lyrica (unable to tolerate side effects), Hydrocodone (was sent to Pain clinic and had an inconsistent urine drug screen), and gabapentin. She has been seeing gynecology who referred her to PCP for treatment of fibromyalgia.     Past Medical History:  Diagnosis Date  . Anxiety   . Anxiety   . Arthritis    patient has bilateral bursitis in hips  . Colon polyp 05/08/2017   Dr Bary Castilla  . Depression   . Fibromyalgia   . Hematuria 06/21/2016  . Neuromuscular disorder [facility])     Past Surgical History:  Procedure Laterality Date  . ABDOMINAL HYSTERECTOMY    . APPENDECTOMY    . COLONOSCOPY WITH PROPOFOL N/A 05/08/2017   Procedure: COLONOSCOPY WITH PROPOFOL;  Surgeon: Robert Bellow, MD;  Location: [facility] ENDOSCOPY;  Service: Endoscopy;  Laterality: N/A;  . ESOPHAGOGASTRODUODENOSCOPY (EGD) WITH PROPOFOL N/A 05/08/2017   Procedure: ESOPHAGOGASTRODUODENOSCOPY (EGD) WITH PROPOFOL;  Surgeon: Robert Bellow, MD;  Location: [facility] ENDOSCOPY;  Service: Endoscopy;  Laterality: N/A;  . OOPHORECTOMY    . TONSILLECTOMY AND ADENOIDECTOMY Bilateral     Family History  Problem Relation Age of Onset  . Ovarian cancer Mother   . Pancreatic cancer Mother   . Bladder Cancer Neg Hx   . Kidney cancer Neg Hx   . Prostate cancer Neg Hx     Social History   Social History  . Marital status: Single    Spouse name: N/A  . Number of children: N/A  . Years of education: N/A   Occupational History   . Not on file.   Social History Main Topics  . Smoking status: Current Some Day Smoker    Packs/day: 0.25    Types: Cigarettes  . Smokeless tobacco: Never Used  . Alcohol use No  . Drug use: No  . Sexual activity: No   Other Topics Concern  . Not on file   Social History Narrative  . No narrative on file     Current Outpatient Prescriptions:  .  ALPRAZolam (XANAX) 1 MG tablet, Take 1 tablet (1 mg total) by mouth 4 (four) times daily., Disp: 84 tablet, Rfl: 0 .  aspirin EC 81 MG tablet, Take 81 mg by mouth daily., Disp: , Rfl:  .  conjugated estrogens (PREMARIN) vaginal cream, Place 1 Applicatorful vaginally 2 (two) times a week., Disp: 42.5 g, Rfl: 12 .  estrogens, conjugated, (PREMARIN) 0.625 MG tablet, Take 1 tablet (0.625 mg total) by mouth daily. Take daily for 21 days then do not take for 7 days., Disp: 30 tablet, Rfl: 11 .  tiZANidine (ZANAFLEX) 4 MG tablet, Take 4 mg by mouth every 6 (six) hours as needed for muscle spasms., Disp: , Rfl:  .  bisacodyl (DULCOLAX) 5 MG EC tablet, Take 5 mg by mouth daily as needed for moderate constipation., Disp: , Rfl:  .  cimetidine (TAGAMET) 400 MG tablet, Take 1 tablet (400 mg total) by mouth 2 (two)  times daily. (Patient not taking: Reported on 05/24/2017), Disp: 60 tablet, Rfl: 0 .  dicyclomine (BENTYL) 20 MG tablet, Take 1 tablet (20 mg total) by mouth 4 (four) times daily -  before meals and at bedtime. (Patient not taking: Reported on 06/27/2017), Disp: 40 tablet, Rfl: 0 .  famotidine (PEPCID) 40 MG tablet, Take 1 tablet (40 mg total) by mouth 2 (two) times daily. (Patient not taking: Reported on 06/27/2017), Disp: 60 tablet, Rfl: 0 .  lovastatin (MEVACOR) 20 MG tablet, Take 20 mg by mouth at bedtime., Disp: , Rfl:  .  mupirocin ointment (BACTROBAN) 2 %, Apply 1 application topically 2 (two) times daily. (Patient not taking: Reported on 06/27/2017), Disp: 22 g, Rfl: 0 .  sucralfate (CARAFATE) 1 g tablet, Take 1 g by mouth 4 (four) times  daily -  with meals and at bedtime., Disp: , Rfl:  .  sucralfate (CARAFATE) 1 GM/10ML suspension, Take 10 mLs (1 g total) by mouth 3 (three) times daily. (Patient not taking: Reported on 05/24/2017), Disp: 420 mL, Rfl: 0  Allergies  Allergen Reactions  . Sulfa Antibiotics Nausea Only     Review of Systems  Constitutional: Positive for malaise/fatigue. Negative for chills and fever.  Musculoskeletal: Positive for back pain, myalgias and neck pain.  Psychiatric/Behavioral: Positive for depression.      Objective  Vitals:   07/03/17 1547  BP: 130/90  Pulse: 85  Temp: (!) 97.3 F (36.3 C)  TempSrc: Oral  SpO2: 96%  Weight: 150 lb 14.4 oz (68.4 kg)    Physical Exam  Constitutional: She is oriented to person, place, and time and well-developed, well-nourished, and in no distress.  Neurological: She is alert and oriented to person, place, and time.  Psychiatric: Mood, memory, affect and judgment normal.  Nursing note and vitals reviewed.     Assessment & Plan  1. Fibromyalgia Patient requesting opioid therapy that she was on before for treatment of fibromyalgia. She was referred to pain clinic last year and I have explained that I will not be prescribing any opioid treatment for fibromyalgia, however I will refer her to a local pain clinic once again and will follow-up. Patient verbalized agreement  - Ambulatory referral to [facility] 07/03/2017 4:11 PM

## 2017-07-03 NOTE — Telephone Encounter (Signed)
The patient called and stated that she would like to know if Dr. Marcelline Mates has sent a referral for her yet. The patient did not disclose any other information other than wanting to speak with a nurse or Dr. Marcelline Mates in regards to her referral. Please advise.

## 2017-07-04 NOTE — Telephone Encounter (Signed)
Called pt informed her that referral has been placed for PCP.

## 2017-07-11 ENCOUNTER — Ambulatory Visit: Payer: Medicaid Other | Admitting: Family Medicine

## 2017-07-16 ENCOUNTER — Other Ambulatory Visit: Payer: Self-pay | Admitting: Obstetrics and Gynecology

## 2017-07-16 DIAGNOSIS — M797 Fibromyalgia: Secondary | ICD-10-CM

## 2017-07-19 ENCOUNTER — Ambulatory Visit: Payer: Self-pay | Admitting: Family Medicine

## 2017-07-23 ENCOUNTER — Other Ambulatory Visit: Payer: Self-pay

## 2017-07-23 DIAGNOSIS — M797 Fibromyalgia: Secondary | ICD-10-CM

## 2017-07-25 ENCOUNTER — Ambulatory Visit: Payer: Self-pay | Admitting: Family Medicine

## 2017-07-30 ENCOUNTER — Ambulatory Visit (INDEPENDENT_AMBULATORY_CARE_PROVIDER_SITE_OTHER): Payer: Medicaid Other | Admitting: Family Medicine

## 2017-07-30 ENCOUNTER — Encounter: Payer: Self-pay | Admitting: Family Medicine

## 2017-07-30 VITALS — BP 130/80 | HR 88 | Temp 98.6°F | Resp 16 | Ht 66.0 in | Wt 154.8 lb

## 2017-07-30 DIAGNOSIS — M797 Fibromyalgia: Secondary | ICD-10-CM

## 2017-07-31 NOTE — Progress Notes (Signed)
Office visit was canceled. At her last visit in August 2018, she was referred to pain clini for treatment of fibromyalgia. She has not yet received any phone call other information from the patient regarding her appointment. I checked with my referral staff and provided a number to the patient so she can contact to schedule an appointment.

## 2017-08-08 ENCOUNTER — Other Ambulatory Visit: Payer: Self-pay | Admitting: Family Medicine

## 2017-08-08 ENCOUNTER — Ambulatory Visit (INDEPENDENT_AMBULATORY_CARE_PROVIDER_SITE_OTHER): Payer: Medicaid Other | Admitting: Obstetrics and Gynecology

## 2017-08-08 VITALS — BP 113/72 | HR 88 | Ht 66.0 in | Wt 155.3 lb

## 2017-08-08 DIAGNOSIS — N951 Menopausal and female climacteric states: Secondary | ICD-10-CM

## 2017-08-08 DIAGNOSIS — R1030 Lower abdominal pain, unspecified: Secondary | ICD-10-CM | POA: Diagnosis not present

## 2017-08-08 DIAGNOSIS — N760 Acute vaginitis: Secondary | ICD-10-CM

## 2017-08-08 DIAGNOSIS — N952 Postmenopausal atrophic vaginitis: Secondary | ICD-10-CM | POA: Diagnosis not present

## 2017-08-08 DIAGNOSIS — R928 Other abnormal and inconclusive findings on diagnostic imaging of breast: Secondary | ICD-10-CM

## 2017-08-08 DIAGNOSIS — M797 Fibromyalgia: Secondary | ICD-10-CM

## 2017-08-08 MED ORDER — ESTRADIOL 0.5 MG PO TABS: 0.5000 mg | ORAL_TABLET | Freq: Every day | ORAL | 11 refills | Status: DC

## 2017-08-08 MED ORDER — HYDROCODONE-ACETAMINOPHEN 7.5-325 MG PO TABS: 1.0000 | ORAL_TABLET | Freq: Three times a day (TID) | ORAL | 0 refills | Status: DC | PRN

## 2017-08-08 NOTE — Progress Notes (Signed)
    GYNECOLOGY PROGRESS NOTE  Subjective:    Patient ID: Patricia Rollins, female    DOB: 09-27-1961, 55 y.o.   MRN: [id number]  HPI  Patient is a 56 y.o. G73P3003 female who presents for complaints of suspected yeast infection and vaginal itching and burning.  Began taking an OTC antifungal cream yesterday.  Patient notes that she used the Premarin cream for 2 weeks straight that was prescribed last visit, however after she decreased to twice weekly, is when she began to notice the itching and burning.  Notes she began to have a slight discharge and so began treating for yeast infection.   Patient still also noting hot flushes and abdominal/pelvic pain. Wonders if the Premarin tablets could be causing this.  Would like to switch to Estradiol 0.5 mg (notes she took this a long time ago and did not have any problems with it). Has had full workup from GYN and GI with no significant findings. Patient notes she feels bloated and that her muscles are tight.   Of note, patient states she still has not heard anything back regarding her Rheumatology referral for her fibromyalgia. Notes that she has an appointment with the pain clinic in ~ 2-3 more weeks, but is hurting now. Requests a prescription for pain.   The following portions of the patient's history were reviewed and updated as appropriate: allergies, current medications, past family history, past medical history, past social history, past surgical history and problem list.  Review of Systems Pertinent items noted in HPI and remainder of comprehensive ROS otherwise negative.   Objective:   Blood pressure 113/72, pulse 88, height 5\' 6"  (1.676 m), weight 155 lb 4.8 oz (70.4 kg). General appearance: alert and but at times appears drowsy.  No acute distress.  Abdomen: soft, non-tender; bowel sounds normal; no masses,  no organomegaly Pelvic: external genitalia normal, rectovaginal septum normal.  Vagina with white cream in vaginal vault, thin,  No odor.  Does not appear to be discharge. Uterus and cervix surgically absent.  Adnexae non-palpable, nontender bilaterally.    Assessment:   Menopausal symptoms Vaginal atrophy Vaginitis Fibromyalgia Abdominal pain  Plan:   - Discussed options for patient regarding menopausal symptoms.  Patient desires to switch from Premarin to Estradiol (thinks her pain and uncontrolled hot flushes may be better managed with this drug).  Will change to Estradiol 0.5 mg at patient's request.  - Vaginal atrophy, patient can continue to use Premarin cream as prescribed.  Notes that it worked better when she was using daily. Discussed caution with use as she is also taking estrogen tablets.  - Vaginitis, patient currently self treating for yeast infection. Unable to perform vaginal culture or wet prep due to the presence of cream. Advised to finish treatment, and can reassess if symptoms do not resolve.  - Fibromyalgia, will look into patient's referral as one has been placed twice.  - Abdominal pain, feels like tightening of the muscles. Discussed use of muscle relaxants, patient notes that she has some at home but just is not using them. Encouraged use as she is not currently using. Requests small prescription for pain meds until she is seen for her initial appt with Pain Management (as she has not had a several prescription in several months as she thought it might have been causing her abdominal pain).    Rubie Maid, MD [facility]

## 2017-08-27 ENCOUNTER — Telehealth: Payer: Self-pay | Admitting: Obstetrics and Gynecology

## 2017-08-27 NOTE — Telephone Encounter (Signed)
Patient states that she hasn't received a call from anyone yet from the referral you gave her.  Please call

## 2017-08-28 NOTE — Telephone Encounter (Signed)
Please inform patient that we have sent several referrals out to different Rheumatology offices in the area, and most have stated that they do  Not treat fibromyalgia.  We are still in search of a provider that will.

## 2017-08-28 NOTE — Telephone Encounter (Signed)
Dr. Marcelline Mates,  [place] called because she had not heard anything about her rheumatology referral. I called [facility] and they stated that they sent something back in Sept, stating that they do not treat fibromyalgia. Did you want another referral placed to another office.

## 2017-08-29 ENCOUNTER — Ambulatory Visit
Payer: Medicaid Other | Attending: Student in an Organized Health Care Education/Training Program | Admitting: Student in an Organized Health Care Education/Training Program

## 2017-08-29 NOTE — Telephone Encounter (Signed)
Spoke with pt and updated her. She had no additional questions at this time.

## 2017-08-29 NOTE — Telephone Encounter (Signed)
Dr. Marcelline Mates, I have spoke with Crystal and she is aware of what is going on and states she is aware and has to do some research to discovery which office will treat this. I called pt and gave her an update, she understood and she states she thought you mentioned something about trying [facility]. I informed pt I was unsure of that but would follow up with you

## 2017-08-29 NOTE — Telephone Encounter (Signed)
Yes, this was one of the ones that I tried, and apparently they no longer have a provider there that treats this.

## 2017-09-03 ENCOUNTER — Other Ambulatory Visit: Payer: Medicaid Other

## 2017-09-05 ENCOUNTER — Encounter: Payer: Medicaid Other | Admitting: Obstetrics and Gynecology

## 2017-09-09 NOTE — Telephone Encounter (Signed)
-----   Message from [place], [place] sent at 09/09/2017  8:38 AM EDT ----- Good morning , Pt not due for appointment until 03/2018 don't see where pt is having issues. Please advise if pt needs to reschedule.  Thank you, Lenda Kelp

## 2017-09-09 NOTE — Telephone Encounter (Signed)
l mom for pt to call back  This encounter was created in error - please disregard.

## 2017-09-11 ENCOUNTER — Encounter: Payer: Medicaid Other | Admitting: Obstetrics and Gynecology

## 2017-09-11 ENCOUNTER — Encounter: Payer: Self-pay | Admitting: Obstetrics and Gynecology

## 2017-09-11 ENCOUNTER — Ambulatory Visit (INDEPENDENT_AMBULATORY_CARE_PROVIDER_SITE_OTHER): Payer: Medicaid Other | Admitting: Obstetrics and Gynecology

## 2017-09-11 VITALS — BP 111/79 | HR 90 | Ht 66.0 in | Wt 162.2 lb

## 2017-09-11 DIAGNOSIS — G8929 Other chronic pain: Secondary | ICD-10-CM

## 2017-09-11 DIAGNOSIS — R109 Unspecified abdominal pain: Secondary | ICD-10-CM

## 2017-09-11 MED ORDER — ESTRADIOL 0.1 MG/GM VA CREA: 0.5000 g | TOPICAL_CREAM | VAGINAL | 2 refills | Status: DC

## 2017-09-11 NOTE — Progress Notes (Signed)
    GYNECOLOGY PROGRESS NOTE  Subjective:    Patient ID: Patricia Rollins, female    DOB: November 30, 1960, 56 y.o.   MRN: [id number]  HPI  Patient is a 56 y.o. G8P3003 female who presents for f/u of vaginal pain.  She states that she is finally starting to notice a difference in her symptoms, thinks that the Estradiol tablets are helping her pain as well as her vasomotor symptoms.  She is also still using the the Premarin cream.  Notes that she begins to feel vaginal irritation (itching and burning) the day after she uses the Premarin.  She is currently using 2-3 nights/week.   In addition, patient states that she was reading in the lobby regarding the add for the medication Linzess.  Patient notes that she has had problems with her bowels for many years (constipation, possibly slow transit, and bloating).  Has seen a GI for colonoscopy last year due to complaints of chronic pain, but otherwise has not had any further evaluation and management.   The following portions of the patient's history were reviewed and updated as appropriate: allergies, current medications, past family history, past medical history, past social history, past surgical history and problem list.   Review of Systems Pertinent items noted in HPI and remainder of comprehensive ROS otherwise negative.   Objective:   Blood pressure 111/79, pulse 90, height 5\' 6"  (1.676 m), weight 162 lb 3.2 oz (73.6 kg). General appearance: alert, cooperative and no distress Remainder of exam deferred.   Assessment:   Menopausal symptoms Vaginal atrophy Vaginitis Chronic constipation?  Plan:   - Will continue Estradiol 0.5 mg daily.  - Vaginal atrophy and vaginitis, discussed changing from Premarin to Estrace cream as it has been known to cause less irritation.  -  Chronic constipation and bloating/abdominal pain.  Patient would like to try Linzess.  Samples given of 145 mcg and 290 mcg to try.  To inform which may help with relief of her  symptoms.  Will also refer to GI for further evaluation and management.    Rubie Maid, MD [facility]

## 2017-09-12 ENCOUNTER — Telehealth: Payer: Self-pay | Admitting: Obstetrics and Gynecology

## 2017-09-12 NOTE — Telephone Encounter (Signed)
Patient called stating she needs to speak with a nurse regarding a medication refill. Thanks

## 2017-09-13 ENCOUNTER — Telehealth: Payer: Self-pay | Admitting: Obstetrics and Gynecology

## 2017-09-13 ENCOUNTER — Other Ambulatory Visit: Payer: Self-pay

## 2017-09-13 DIAGNOSIS — M797 Fibromyalgia: Secondary | ICD-10-CM

## 2017-09-13 DIAGNOSIS — Z7689 Persons encountering health services in other specified circumstances: Secondary | ICD-10-CM

## 2017-09-13 NOTE — Telephone Encounter (Signed)
Pt aware PA is complete for estrace cream (our end). Ref #- [id number]- Spoke with Ita at [facility]. Was sent to pharmacist for further review. It may take 24h for an answer. Her drug store can run the rx again tomorrow. Pt request a refill of norco. Will ask AC.

## 2017-09-13 NOTE — Telephone Encounter (Signed)
Patient called stating the estradiol cream needs prior authorization. She also requests a refill on Norco.Thanks

## 2017-09-16 ENCOUNTER — Ambulatory Visit: Payer: Medicaid Other | Admitting: Cardiovascular Disease

## 2017-09-17 ENCOUNTER — Telehealth: Payer: Self-pay | Admitting: Obstetrics and Gynecology

## 2017-09-17 NOTE — Telephone Encounter (Signed)
Pharmacy called and stated they could fill the estradiol name brand but pt requests generic which requires a PA. Looks like phone note from 11\02, nurse started a PA. Still awaiting decision

## 2017-09-17 NOTE — Telephone Encounter (Signed)
Patient's pharmacy called and stated that they are not able to fill the patients medication. The medication is not approved and they would like for a nurse or physician to give them a call back. No other information was disclosed. Please advise.

## 2017-09-17 NOTE — Telephone Encounter (Signed)
Pts insurance will not pay for estrace cream. Per her insurance she needs to fail to preferred drugs first- she has failed premarin (causes itching and irritation)- offered to rx vagifem or estring. Pt declines at this time. Pt requested a pcp referral for her fibromyalgia. Ordered. Pt aware if no call in 10 business days to contact office.

## 2017-09-23 NOTE — Telephone Encounter (Signed)
Crystal Sabra Heck has spoken with pt. Will close my message

## 2017-09-26 ENCOUNTER — Encounter: Payer: Self-pay | Admitting: Gastroenterology

## 2017-10-09 ENCOUNTER — Ambulatory Visit: Payer: Medicaid Other | Admitting: Gastroenterology

## 2017-10-09 ENCOUNTER — Encounter: Payer: Self-pay | Admitting: Gastroenterology

## 2017-10-09 VITALS — BP 111/70 | HR 85 | Temp 98.4°F | Ht 66.0 in | Wt 163.0 lb

## 2017-10-09 DIAGNOSIS — R1084 Generalized abdominal pain: Secondary | ICD-10-CM

## 2017-10-09 DIAGNOSIS — K581 Irritable bowel syndrome with constipation: Secondary | ICD-10-CM | POA: Diagnosis not present

## 2017-10-09 NOTE — Progress Notes (Signed)
Vonda Antigua, MD [address] [place], [place], [place], [address] [address], [place], [place], [place], [address] Phone: [phone]  Fax: [phone]  Consultation  Referring Provider:     Rubie Maid, MD Primary Care Physician:  Patient, No Pcp Per Primary Gastroenterologist:  Virgel Manifold, MD        Reason for Consultation:   Chronic abdominal pain  Date of Consultation:  10/09/2017         HPI:   Patricia Rollins is a 56 y.o. female with history of anxiety, fibromyalgia, referred for chronic GI complaints.  Patient's main complaint today is intrascapular pain, ongoing for 15 months.  She also reports history of chronic constipation and states having a bowel movement does not help with her intrascapular pain.  Also reports abdominal discomfort.  She reports both intrascapular pain and abdominal discomfort are constant dull 5/10 pains that are not relieved with bowel movements.  No blood in stool.  No nausea vomiting.  She is convinced that her intrascapular pain is coming from her abdomen.  She states she was given amoxicillin for dental surgery a few weeks ago, and 3 days of taking amoxicillin helped with her intrascapular pain.  She is convinced that she has a bacterial infection in her stomach and that she needs antibiotics.  Patient is on chronic opioids, and states even when she tried to stop the opioids her constipation did not resolve.  She takes Dulcolax at bedtime every day.  Was given Linzess but does not take it as she states it does not help her.  She was also given omeprazole in the past but stopped it as it caused headaches.  During my interview, she seems very resistant to recommendations and suggestions, looks away, and does not make eye contact when I am asking questions, and does not seem to want to form a therapeutic relationship.   She has had extensive workup with, EGD and colonoscopy and CT scans that have been unrevealing of the etiology of her  symptoms.  Her last EGD was in June 2018 by Dr. Bary Castilla and showed mild chronic gastritis and colonoscopy at the same time with one 8 mm polyp, tubular adenoma, removed.  Biopsies were negative for H. pylori and showed minimal chronic gastritis.  Hiatal hernia was also noted distal esophagus biopsies showed acute esophagitis.  Repeat colonoscopy was recommended in 5 years.  Patient was followed by Dr. Bary Castilla in clinic and patient states she had a hydrogen breath test that was negative.  I am unable to find this report.    She has been seen by our office, Dr. Vicente Males before.  Last time was in March 2018.  Dr. Vicente Males has no states that patient was initially seen for constipation while she was on hydrocodone for fibromyalgia.  X-ray showed a lot of stool in the right colon, she was started on Linzess by Dr. Manuella Ghazi and did not use it.  Dr. Vicente Males recommended EGD and colonoscopy, but it seems that she did not have that done with our practice but instead with Dr. Bary Castilla.  She was also seen by other GI at [facility] in January 2018 at her request and had a normal ultrasound of her abdomen.  They had recommended EGD and colonoscopy at that time as well but she did not get this done with them.  Past Medical History:  Diagnosis Date  . Anxiety   . Anxiety   . Arthritis    patient has bilateral bursitis  in hips  . Colon polyp 05/08/2017   Dr Bary Castilla  . Depression   . Fibromyalgia   . Hematuria 06/21/2016  . Neuromuscular disorder [facility])     Past Surgical History:  Procedure Laterality Date  . ABDOMINAL HYSTERECTOMY    . APPENDECTOMY    . COLONOSCOPY WITH PROPOFOL N/A 05/08/2017   Procedure: COLONOSCOPY WITH PROPOFOL;  Surgeon: Robert Bellow, MD;  Location: [facility] ENDOSCOPY;  Service: Endoscopy;  Laterality: N/A;  . ESOPHAGOGASTRODUODENOSCOPY (EGD) WITH PROPOFOL N/A 05/08/2017   Procedure: ESOPHAGOGASTRODUODENOSCOPY (EGD) WITH PROPOFOL;  Surgeon: Robert Bellow, MD;  Location: [facility] ENDOSCOPY;  Service: Endoscopy;   Laterality: N/A;  . OOPHORECTOMY    . TONSILLECTOMY AND ADENOIDECTOMY Bilateral     Prior to Admission medications   Medication Sig Start Date End Date Taking? Authorizing Provider  ALPRAZolam Duanne Moron) 1 MG tablet Take 1 tablet (1 mg total) by mouth 4 (four) times daily. 06/21/16  Yes Roselee Nova, MD  aspirin EC 81 MG tablet Take by mouth.   Yes [provider]  conjugated estrogens (PREMARIN) vaginal cream Place 1 Applicatorful vaginally 2 (two) times a week. 05/30/17  Yes Rubie Maid, MD  estradiol (ESTRACE) 0.5 MG tablet Take 1 tablet (0.5 mg total) by mouth daily. 08/08/17  Yes Rubie Maid, MD  estrogens, conjugated, (PREMARIN) 0.625 MG tablet Take by mouth.   Yes [provider]  HYDROcodone-acetaminophen (NORCO) 7.5-325 MG tablet Take 1 tablet by mouth every 8 (eight) hours as needed for moderate pain. 08/08/17  Yes Rubie Maid, MD  lactulose (KRISTALOSE) 10 g packet Take by mouth. 01/22/17  Yes [provider]  lovastatin (MEVACOR) 20 MG tablet Take 20 mg by mouth at bedtime.   Yes [provider]  metaxalone (SKELAXIN) 800 MG tablet Take by mouth. 12/25/16  Yes [provider]  pregabalin (LYRICA) 25 MG capsule Take by mouth. 02/14/17  Yes [provider]  tiZANidine (ZANAFLEX) 4 MG tablet Take 4 mg by mouth every 6 (six) hours as needed for muscle spasms.   Yes [provider]  dicyclomine (BENTYL) 20 MG tablet Take 1 tablet (20 mg total) by mouth 4 (four) times daily -  before meals and at bedtime. Patient not taking: Reported on 06/27/2017 05/23/17   Robert Bellow, MD  estradiol (ESTRACE VAGINAL) 0.1 MG/GM vaginal cream Place 0.5 g vaginally 3 (three) times a week. Patient not taking: Reported on 10/09/2017 09/11/17   Rubie Maid, MD  mupirocin ointment (BACTROBAN) 2 % Apply 1 application topically 2 (two) times daily. Patient not taking: Reported on 09/11/2017 05/24/17   Roselee Nova, MD  sucralfate  (CARAFATE) 1 g tablet Take 1 g by mouth 4 (four) times daily -  with meals and at bedtime.    [provider]    Family History  Problem Relation Age of Onset  . Ovarian cancer Mother   . Pancreatic cancer Mother   . Bladder Cancer Neg Hx   . Kidney cancer Neg Hx   . Prostate cancer Neg Hx      Social History   Tobacco Use  . Smoking status: Current Some Day Smoker    Packs/day: 0.25    Types: Cigarettes  . Smokeless tobacco: Never Used  Substance Use Topics  . Alcohol use: No    Alcohol/week: 0.0 oz  . Drug use: No    Allergies as of 10/09/2017 - Review Complete 10/09/2017  Allergen Reaction Noted  . Sulfa antibiotics Nausea Only 05/12/2015  Review of Systems:    All systems reviewed and negative except where noted in HPI.   Physical Exam:  Vital signs in last 24 hours: Vitals:   10/09/17 1443  BP: 111/70  Pulse: 85  Temp: 98.4 F (36.9 C)  TempSrc: Oral  Weight: 73.9 kg (163 lb)  Height: 5\' 6"  (1.676 m)     General:   Pleasant, cooperative in NAD Head:  Normocephalic and atraumatic. Eyes:   No icterus.   Conjunctiva pink. PERRLA. Ears:  Normal auditory acuity. Neck:  Supple; no masses or thyroidomegaly Lungs: Respirations even and unlabored. Lungs clear to auscultation bilaterally.   No wheezes, crackles, or rhonchi.  Heart:  Regular rate and rhythm;  Without murmur, clicks, rubs or gallops Abdomen:  Soft, nondistended, mildly tender to palpation bilateral lower quadrant.  The tenderness was not present when I palpated with my stethoscope, but was present when I palpated with my hand.  Normal bowel sounds. No appreciable masses or hepatomegaly.  No rebound or guarding.  Neurologic:  Alert and oriented x3;  grossly normal neurologically. Skin:  Intact without significant lesions or rashes. Cervical Nodes:  No significant cervical adenopathy. Psych:  Alert and cooperative. Normal affect.  LAB RESULTS: No results for input(s): WBC, HGB, HCT, PLT  in the last 72 hours. BMET No results for input(s): NA, K, CL, CO2, GLUCOSE, BUN, CREATININE, CALCIUM in the last 72 hours. LFT No results for input(s): PROT, ALBUMIN, AST, ALT, ALKPHOS, BILITOT, BILIDIR, IBILI in the last 72 hours. PT/INR No results for input(s): LABPROT, INR in the last 72 hours.   March 2018 labs reviewed and show no anemia, normal liver enzymes Lipase is normal STUDIES: No results found. CT report and images reviewed, EGD colonoscopy reviewed   Impression / Plan:   Patricia Rollins is a 56 y.o. y/o female with chronic abdominal issues, fibromyalgia, anxiety, depression referred for chronic GI issues, and intrascapular pain with symptoms likely due to IBS-C  Patient's previous records show that she has been seeing multiple doctors for the same complaints, with no regular follow-up with one doctor/practice.  Following up with multiple practices like this, definitely affects ideal care of the patient.  During my interview today, she is resistant to recommendations and suggestions, and is Rudene Christians that her symptoms are coming from stomach bacteria and that she needs antibiotics.   I have discussed with her in great detail, that her stomach biopsies for H. pylori were previously negative.  She states her hydrogen breath test was negative and was done by Dr. Bary Castilla (we will need to obtain records of this).  I have also discussed with her that bacterial overgrowth symptoms are very different than her symptoms, and they typically cause diarrhea.  In addition chronic bacterial overgrowth can cause anemia which she does not have.  Her extensive workup does not point to bacterial overgrowth, however repeat bacterial overgrowth testing can be done.  Since this test has already been done and has been made does not want to do it at this time.  She can contact us if she is agreeable to this in the future.  I explained to her in great detail, that we cannot prescribe antibiotics, without a good  indication, as it can lead to other side effects including C. difficile diarrhea, which can cause more chronic GI issues.  Patient does not appear happy with this recommendation however, risks of starting antibiotics without a good indication at this time would outweigh benefits.  I also explained  to her that intrascapular pain is not likely to be coming from her abdomen, and may be related to her C3-C4 disc did not generation.  She should follow-up with her PCP in this regard.  Care everywhere shows that she had cardiology visits for intrascapular pain, and stress test was recommended, I am unable to see any reports that she got this done.  I do believe her chronic abdominal discomfort is related to her IBS-C.  Review of her CT images does show some stool in the colon. She should start a High fiber diet.  I would recommend that she start MiraLAX twice a day.  Patient does not want to take this as she states it has not helped her in the past.  But she has not taking this consistently for a long period of time.  She does not want to take Linzess either as she does not think it has helped her in the past.  She remains very resistant to recommendations. I also recommended Dulcolax suppositories at bedtime for 7-14 days, but she states it does not help and does not want to take it.    We have given her samples of IBS gard she is willing to try this. If this helps, she can continue this over the counter.   No indication for repeat EGD or colonoscopy given that she just had one in June 2018.  We will monitor her for improvement in symptoms on subsequent visits.  She should establish care with a primary care physician.  She should maintain follow-up with one practice, as changing multiple practices affects her care.  She was asked to avoid constipating medications and decrease her narcotics as much as possible.  Thank you for involving me in the care of this patient.     Virgel Manifold, MD   10/09/2017, 3:40 PM

## 2017-11-14 ENCOUNTER — Telehealth: Payer: Self-pay | Admitting: Cardiovascular Disease

## 2017-11-14 MED ORDER — LOVASTATIN 20 MG PO TABS: 20.0000 mg | ORAL_TABLET | Freq: Every day | ORAL | 3 refills | Status: DC

## 2017-11-14 NOTE — Telephone Encounter (Signed)
°*  STAT* If patient is at the pharmacy, call can be transferred to refill team.   1. Which medications need to be refilled? (please list name of each medication and dose if known)    Lovastatin 20 mg po q hs   2. Which pharmacy/location (including street and city if local pharmacy) is medication to be sent to?    [place]   3. Do they need a 30 day or 90 day supply? [place]

## 2017-11-14 NOTE — Telephone Encounter (Signed)
Please advise if this medication is okay to be refilled by Dr. Rockey Situ

## 2017-11-14 NOTE — Telephone Encounter (Signed)
Refill sent in to pharmacy requested. 

## 2017-11-21 ENCOUNTER — Telehealth: Payer: Self-pay | Admitting: Obstetrics and Gynecology

## 2017-11-21 NOTE — Telephone Encounter (Signed)
A nurse from another office called in regards to [facility] stating that the patient is wanting to switch back to Premarin instead of Estrogen, The patient has scheduled an appointment, And would like to speak with a  Nurse if possible. Please advise.

## 2017-11-22 MED ORDER — ESTROGENS CONJUGATED 0.625 MG PO TABS: 0.6250 mg | ORAL_TABLET | Freq: Every day | ORAL | 6 refills | Status: DC

## 2017-11-22 NOTE — Telephone Encounter (Signed)
You did.  She changed from the the Premarin to the Estradiol due to cost.  So now she wants to go back.  She changes her prescription every 3 months. We can send it in but the cost has not changed. I do believe she was on Premarin 0.3 mg.

## 2017-11-22 NOTE — Telephone Encounter (Signed)
LMTRC

## 2017-11-22 NOTE — Telephone Encounter (Signed)
Pt needs 0.625 premarin tabs. Aware med has been erx.

## 2017-11-27 ENCOUNTER — Encounter: Payer: Medicaid Other | Admitting: Obstetrics and Gynecology

## 2017-12-04 ENCOUNTER — Other Ambulatory Visit: Payer: Self-pay | Admitting: Student

## 2017-12-04 ENCOUNTER — Other Ambulatory Visit: Payer: Self-pay | Admitting: Chiropractic Medicine

## 2017-12-04 DIAGNOSIS — R110 Nausea: Secondary | ICD-10-CM

## 2017-12-04 DIAGNOSIS — R109 Unspecified abdominal pain: Principal | ICD-10-CM

## 2017-12-04 DIAGNOSIS — G8929 Other chronic pain: Secondary | ICD-10-CM

## 2017-12-16 ENCOUNTER — Other Ambulatory Visit: Payer: Self-pay

## 2017-12-24 ENCOUNTER — Encounter: Admission: RE | Admit: 2017-12-24 | Payer: Medicaid Other | Source: Ambulatory Visit

## 2017-12-24 ENCOUNTER — Ambulatory Visit: Payer: Medicaid Other

## 2017-12-25 ENCOUNTER — Other Ambulatory Visit
Admission: RE | Admit: 2017-12-25 | Discharge: 2017-12-25 | Disposition: A | Discharge status: Home / Self Care | Payer: Medicaid Other | Source: Ambulatory Visit | Attending: Student | Admitting: Student

## 2017-12-25 DIAGNOSIS — R933 Abnormal findings on diagnostic imaging of other parts of digestive tract: Secondary | ICD-10-CM | POA: Diagnosis present

## 2017-12-26 LAB — PANCREATIC ELASTASE, FECAL

## 2017-12-27 LAB — FECAL FAT, QUALITATIVE
Fat Qual Neutral, Stl: NORMAL
Fat Qual Total, Stl: NORMAL

## 2018-01-01 ENCOUNTER — Telehealth: Payer: Self-pay | Admitting: Gastroenterology

## 2018-01-01 NOTE — Telephone Encounter (Signed)
Records have been placed on DOD 01/01/18 PM Dr.Armbruster's desk for review.

## 2018-01-08 NOTE — Telephone Encounter (Signed)
Spoke with Darlene at [facility], [place] office states her provider is wanting her to be seen in [place], and prefers patient establishes Gi care in [place] instead. Patient states she only wants to go where her PCP recommends she goes. Please advise.

## 2018-01-09 ENCOUNTER — Encounter: Payer: Self-pay | Admitting: Gastroenterology

## 2018-01-09 NOTE — Telephone Encounter (Signed)
Dr.Armbruster reviewed records and accepted for patient to be scheduled for an office visit. Left message for patient to call back and schedule an office visit with him.

## 2018-01-21 ENCOUNTER — Encounter: Payer: Medicaid Other | Admitting: Obstetrics and Gynecology

## 2018-01-29 ENCOUNTER — Ambulatory Visit
Admission: RE | Admit: 2018-01-29 | Discharge: 2018-01-29 | Disposition: A | Discharge status: Home / Self Care | Payer: Medicaid Other | Source: Ambulatory Visit | Attending: Student | Admitting: Student

## 2018-01-29 DIAGNOSIS — G8929 Other chronic pain: Secondary | ICD-10-CM | POA: Insufficient documentation

## 2018-01-29 DIAGNOSIS — R110 Nausea: Secondary | ICD-10-CM

## 2018-01-29 DIAGNOSIS — R109 Unspecified abdominal pain: Principal | ICD-10-CM

## 2018-01-29 MED ORDER — TECHNETIUM TC 99M MEBROFENIN IV KIT
5.0000 | PACK | Freq: Once | INTRAVENOUS | Status: AC | PRN
  Administered 2018-01-29: 5.002 via INTRAVENOUS

## 2018-01-30 ENCOUNTER — Encounter: Payer: Medicaid Other | Admitting: Obstetrics and Gynecology

## 2018-01-31 IMAGING — CR DG CHEST 2V
1 series · 2 of 2 positions shown · non-contrast
Comparison: Chest CT 07/31/2016

CLINICAL DATA: Back pain and pelvic pain

EXAM:
CHEST  2 VIEW

[Series 1: dg chest 2 view · 0.14mm/px · 2 of 2 slices shown]
[im 1/2]
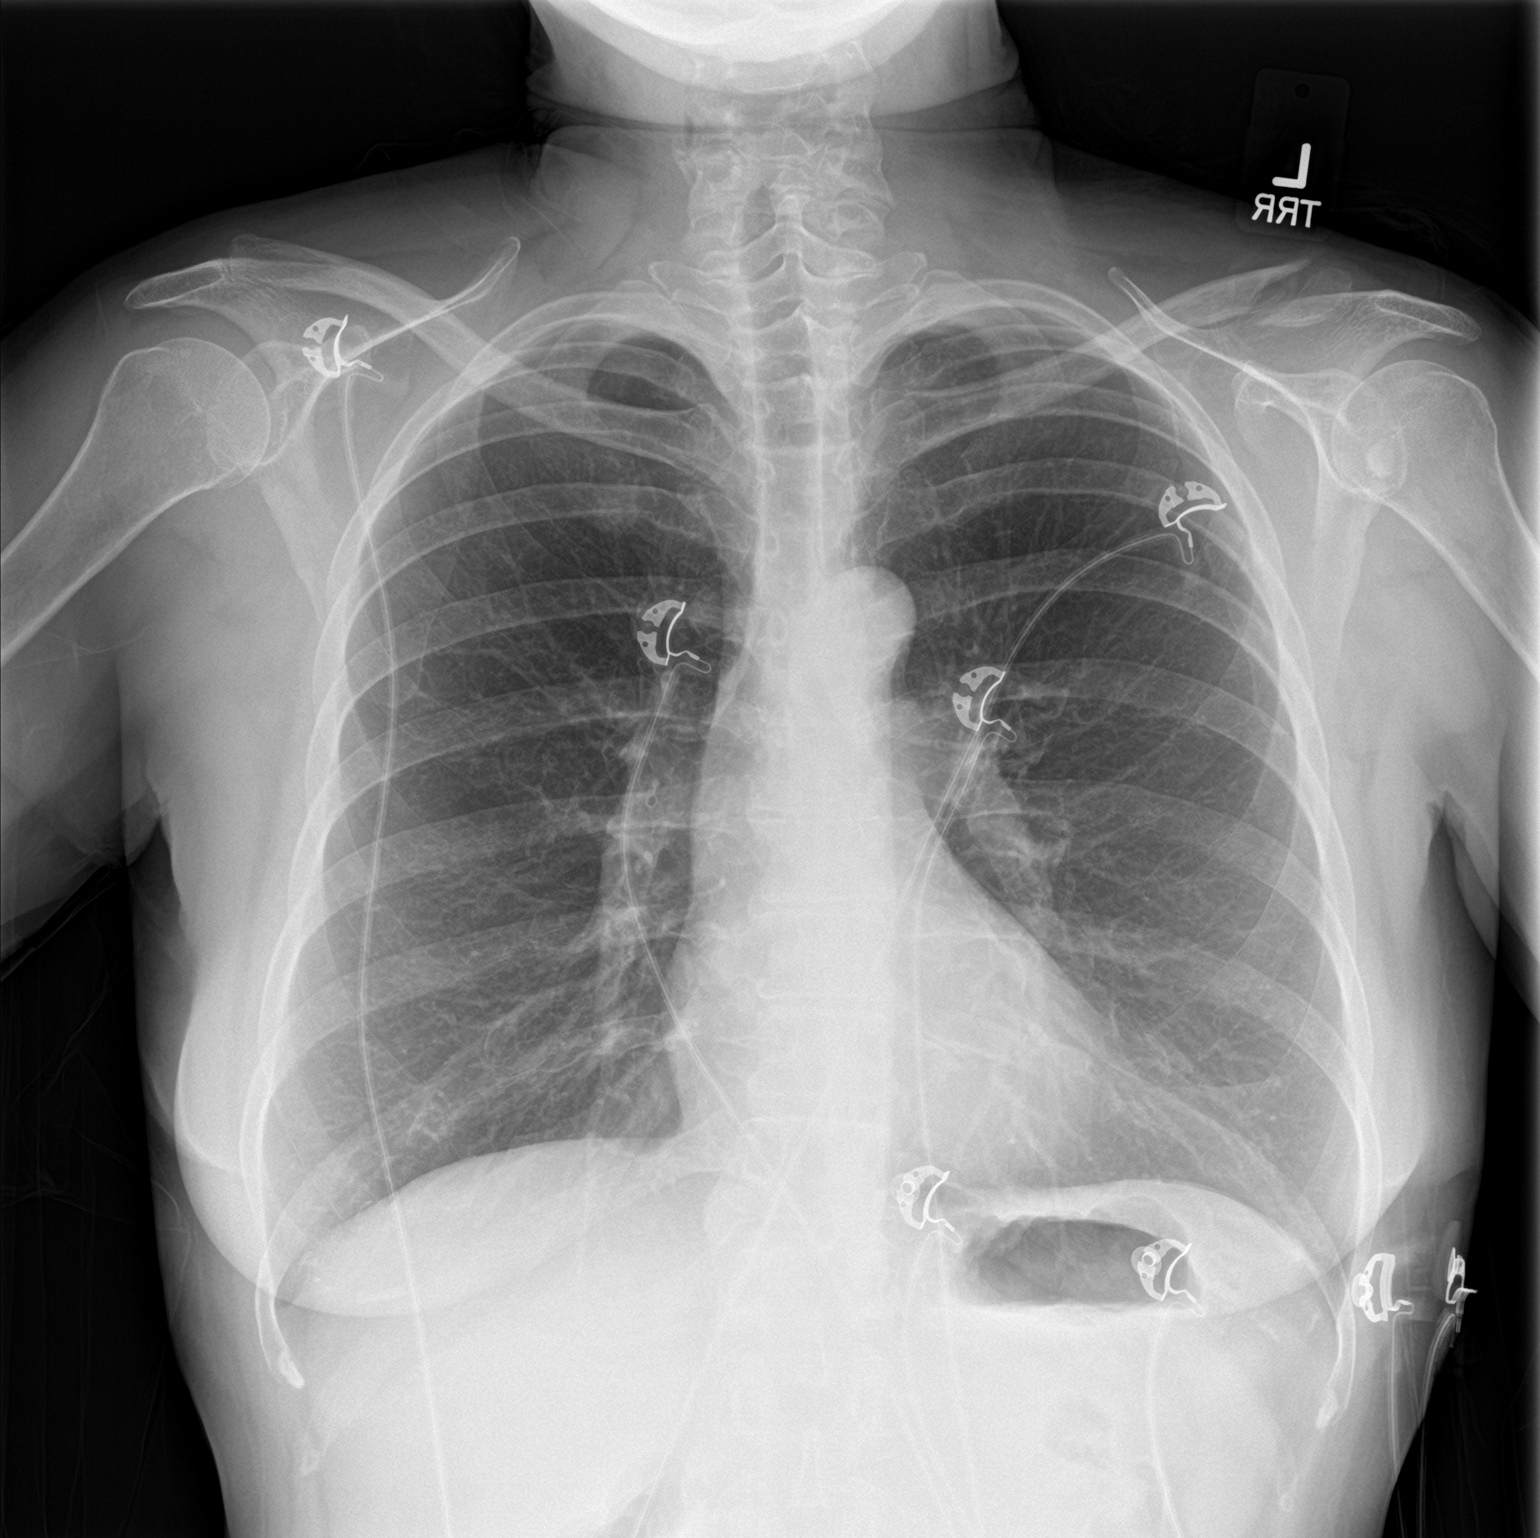
[im 2/2]
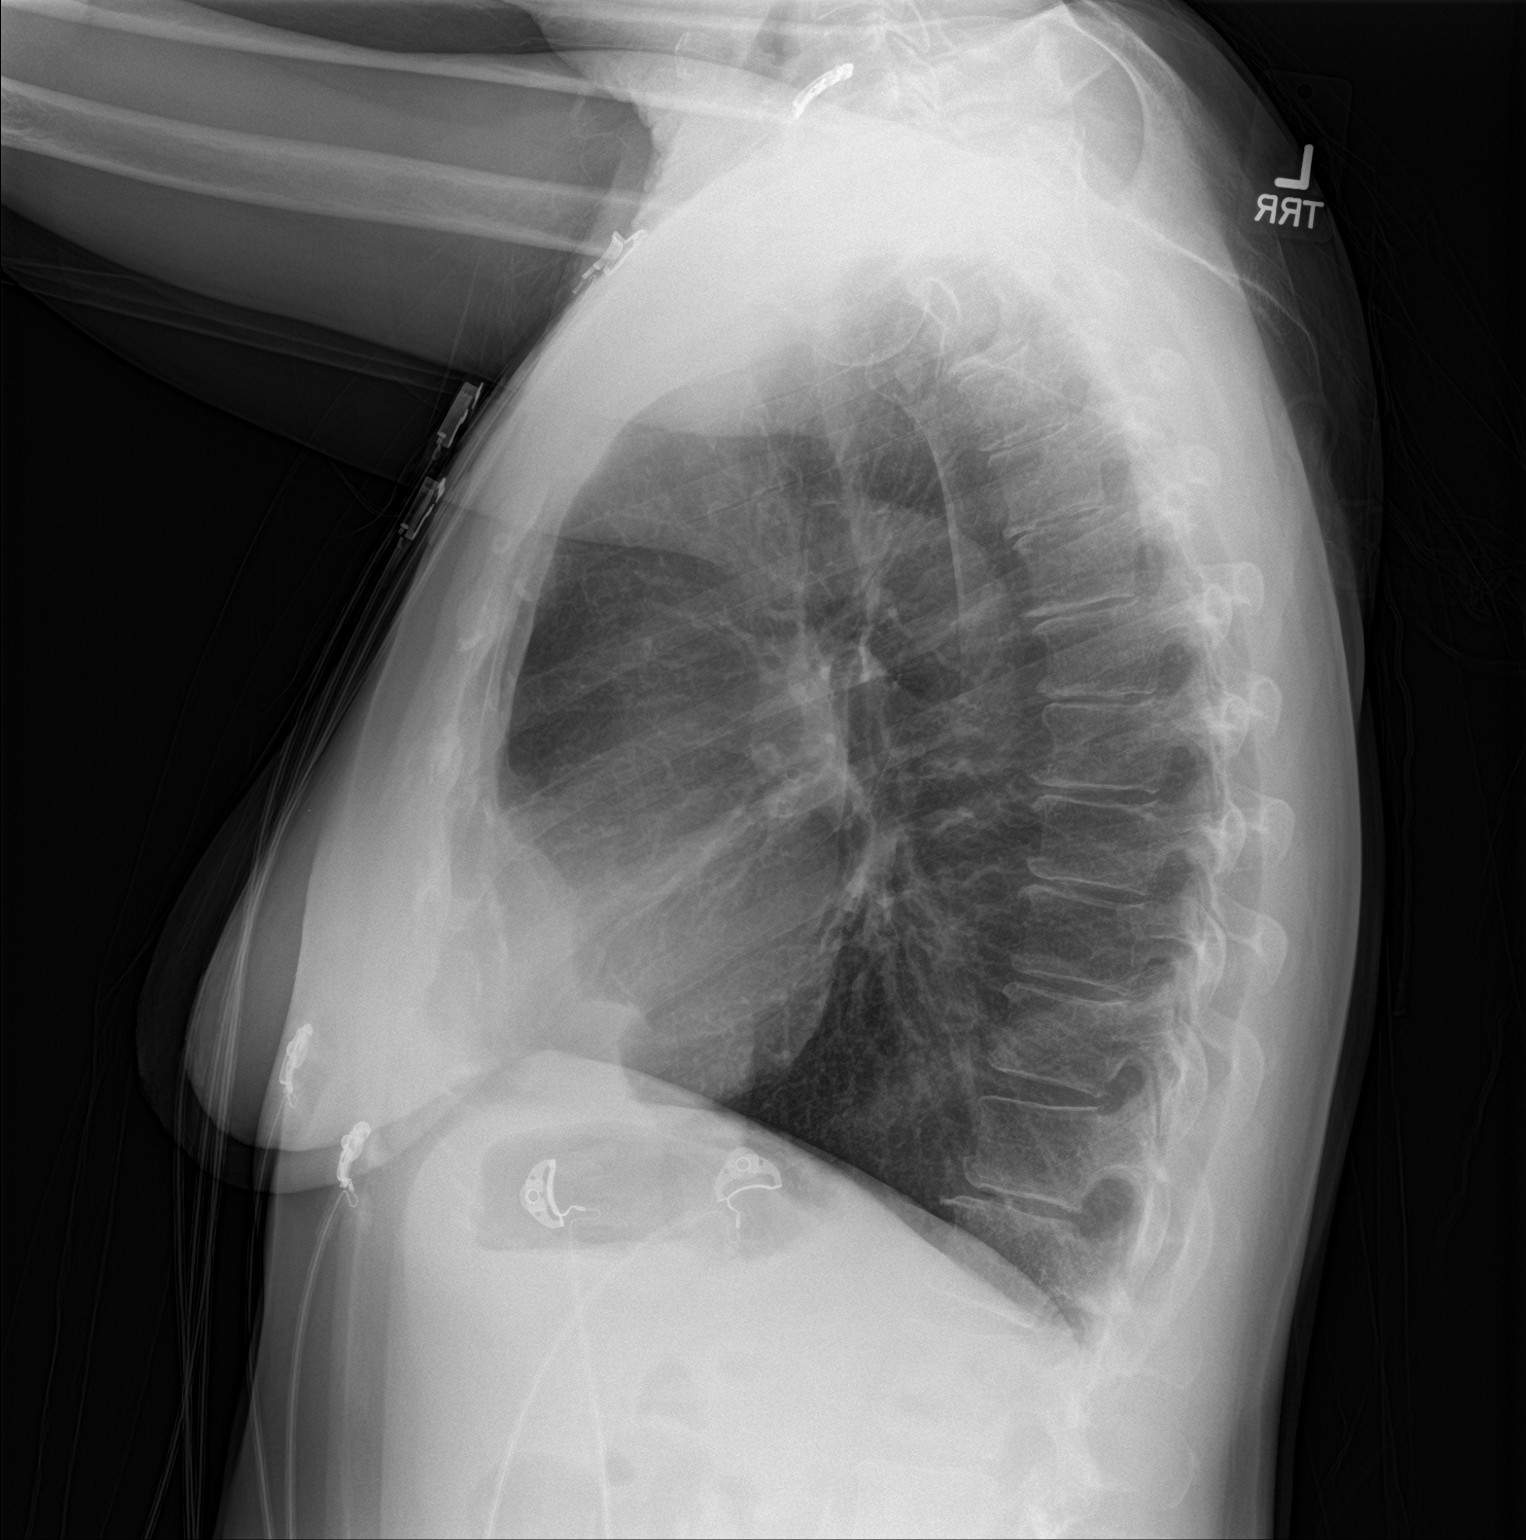

[2 of 2 positions shown; findings below may reference images not displayed]

FINDINGS: The heart size and mediastinal contours are within normal limits.
Both lungs are clear. Left glenoid fossa enostosis is unchanged.
IMPRESSION: No active cardiopulmonary disease.

## 2018-01-31 IMAGING — CT CT RENAL STONE PROTOCOL
2 of 4 series · 16 of 46 positions shown, 18 images · non-contrast
Comparison: CT of the abdomen and pelvis from 07/31/2016, and
abdominal ultrasound performed 01/07/2017

CLINICAL DATA: Chronic worsening lower abdominal pain and vaginal
pain. Chronic left shoulder pain. Initial encounter.

EXAM:
CT ABDOMEN AND PELVIS WITHOUT CONTRAST
TECHNIQUE: Multidetector CT imaging of the abdomen and pelvis was performed
following the standard protocol without IV contrast.

[Series 2: stone full standard · axial · 0.75mm/px · z∈[-779,-409]mm · 13 of 82 slices shown, 15 images]
[im 4/82  soft-tissue]
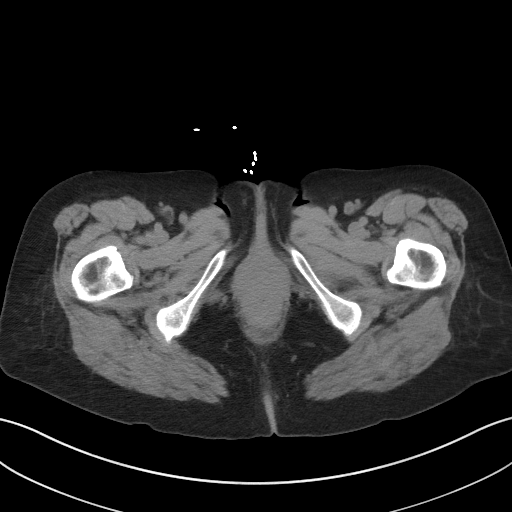
[im 4/82  bone]
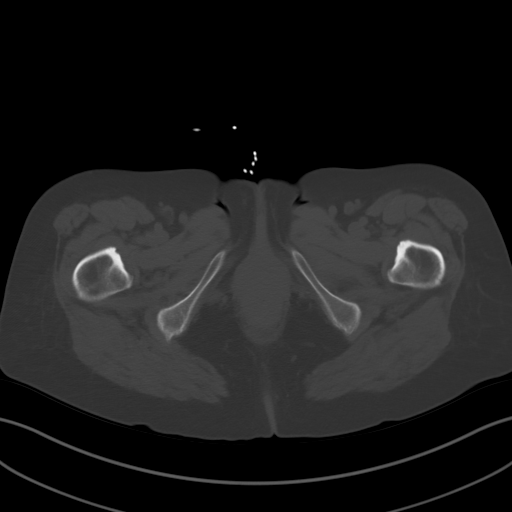
[im 10/82  soft-tissue]
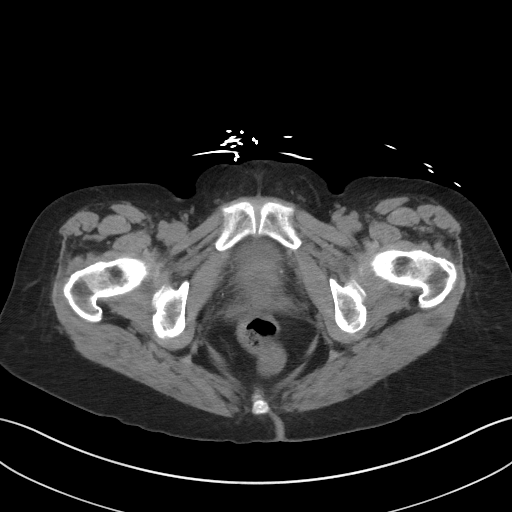
[im 16/82  soft-tissue]
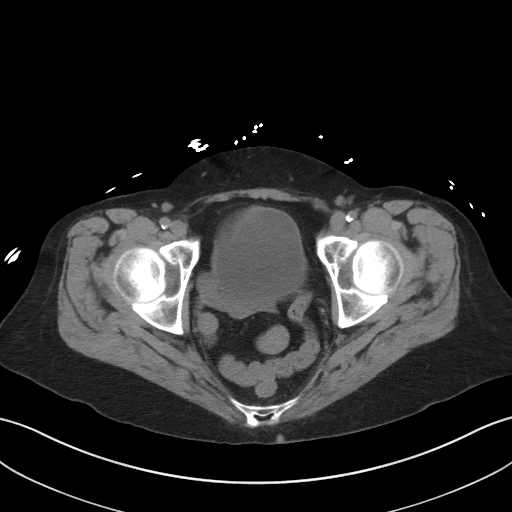
[im 22/82  soft-tissue]
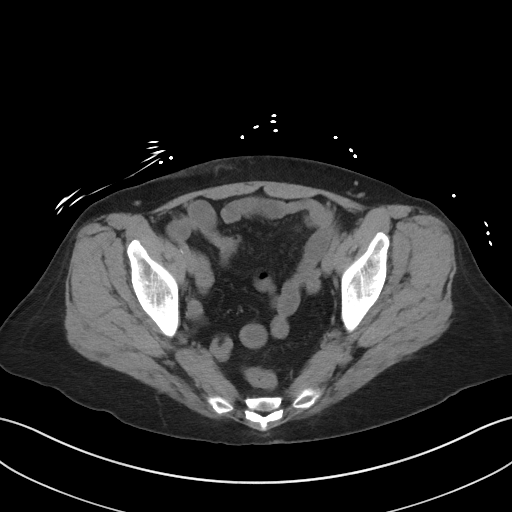
[im 29/82  soft-tissue]
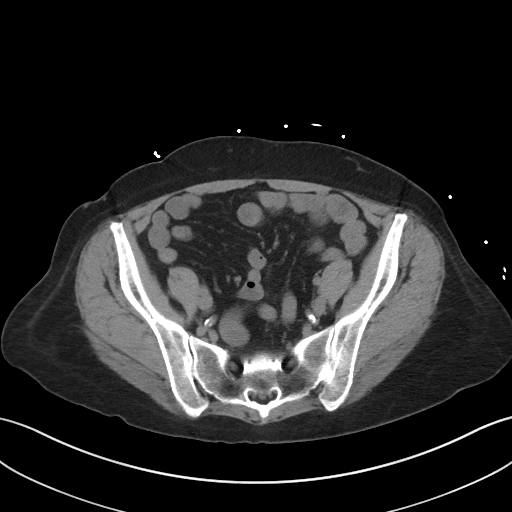
[im 35/82  soft-tissue]
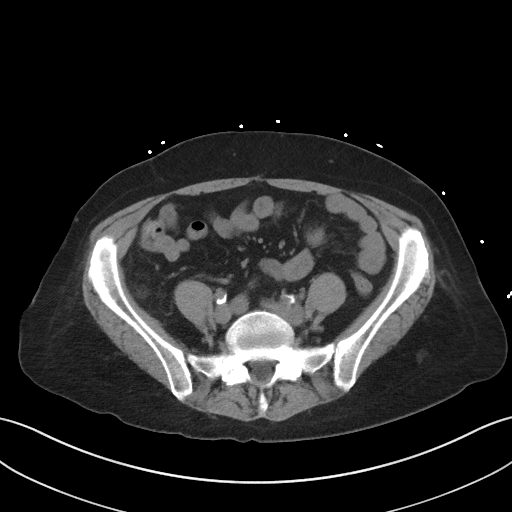
[im 41/82  soft-tissue]
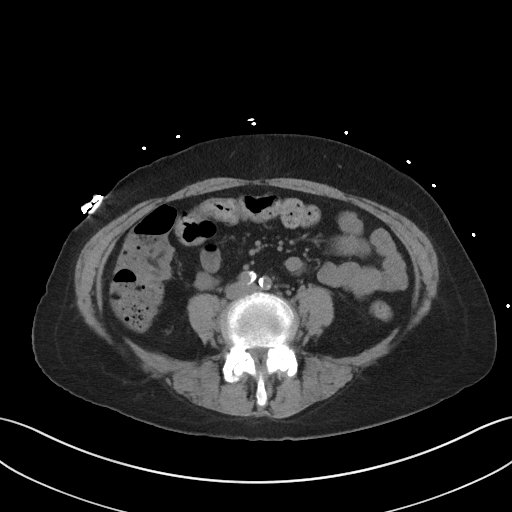
[im 47/82  soft-tissue]
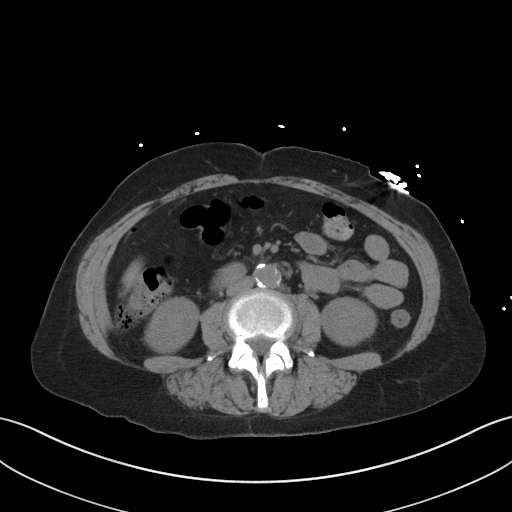
[im 53/82  soft-tissue]
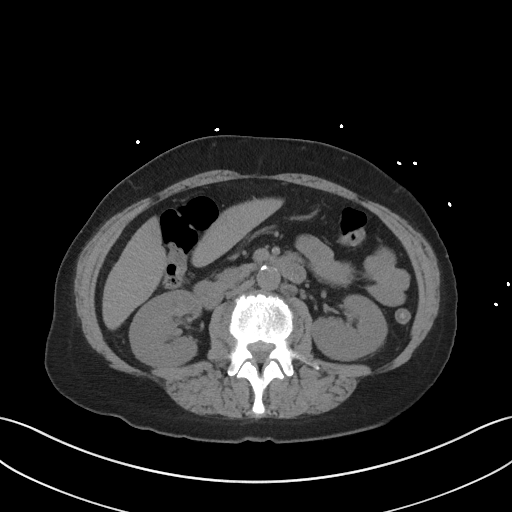
[im 53/82  bone]
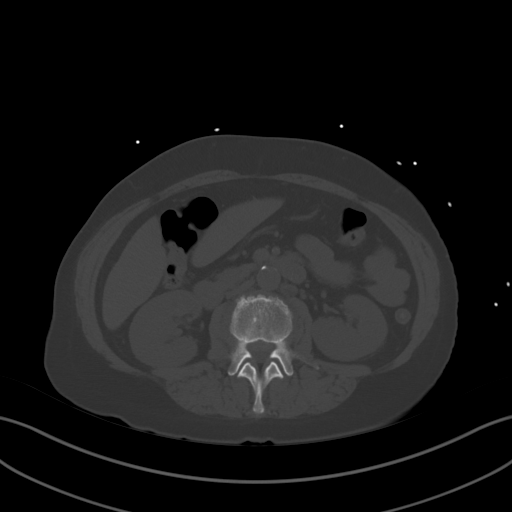
[im 60/82  soft-tissue]
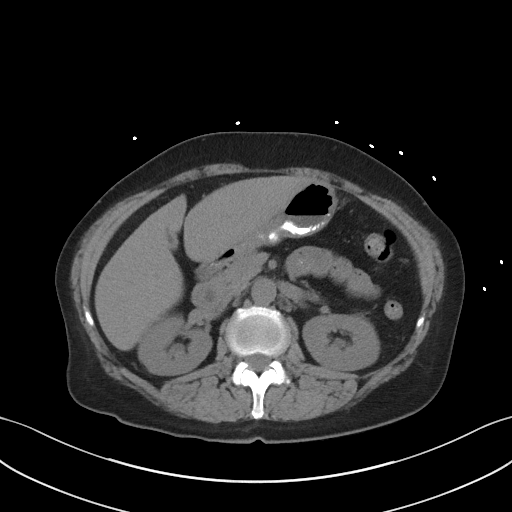
[im 66/82  soft-tissue]
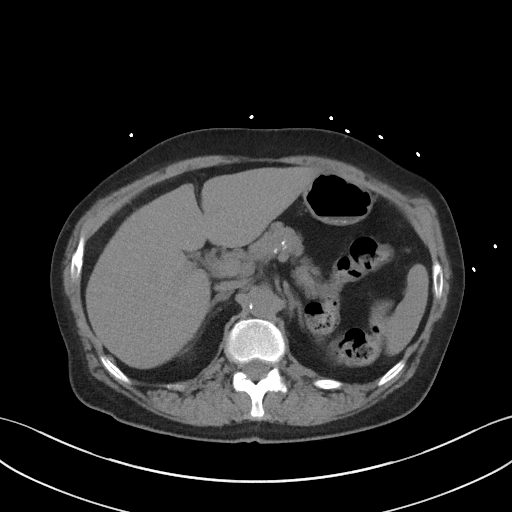
[im 72/82  soft-tissue]
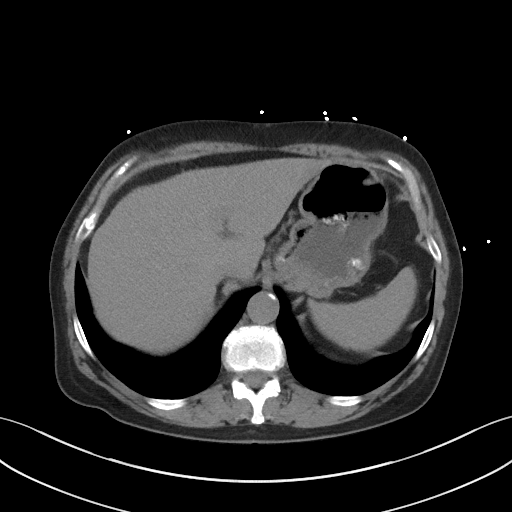
[im 78/82  soft-tissue]
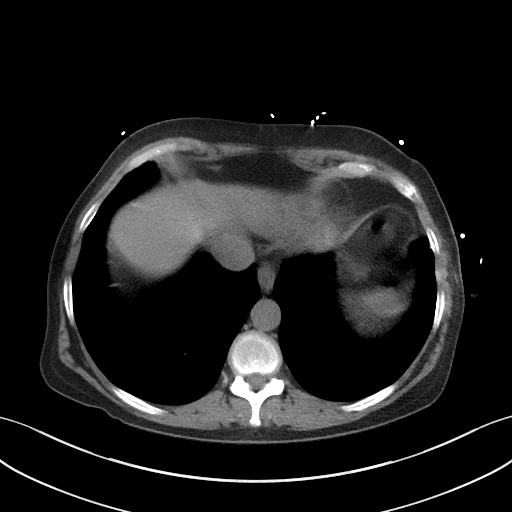

[Series 5: coronal · coronal · 0.69mm/px · 3 of 116 slices shown]
[im 39/116  soft-tissue]
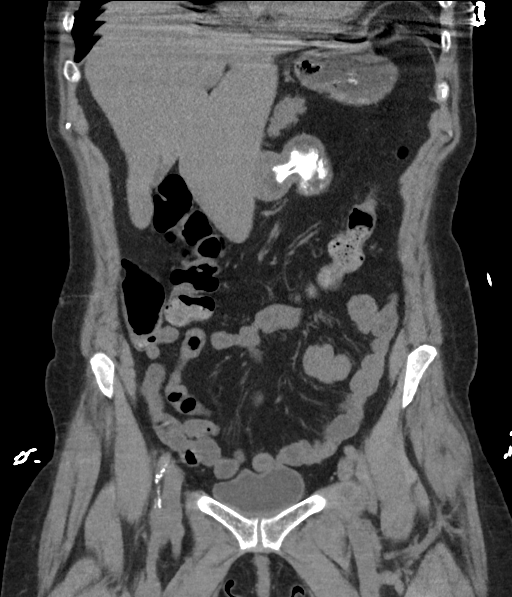
[im 52/116  soft-tissue]
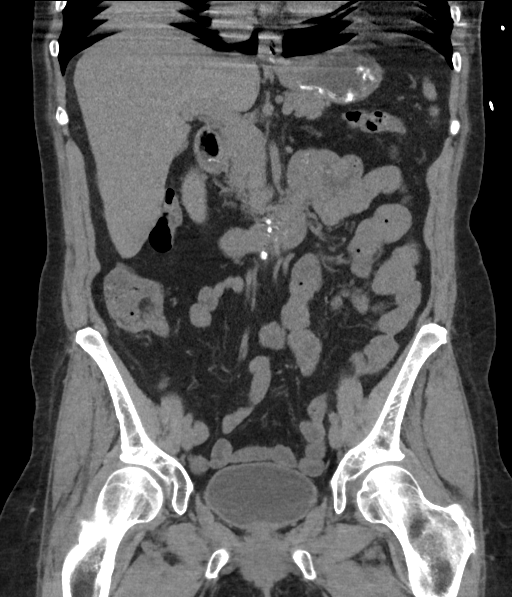
[im 64/116  soft-tissue]
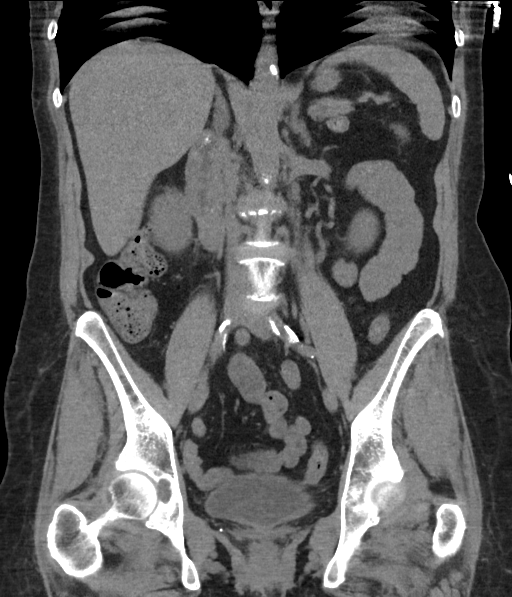

[16 of 46 positions shown; findings below may reference images not displayed]

FINDINGS: Lower chest: The visualized lung bases are grossly clear. The
visualized portions of the mediastinum are unremarkable.

Hepatobiliary: The liver is unremarkable in appearance. The
gallbladder is unremarkable in appearance. The common bile duct
remains normal in caliber.

Pancreas: The pancreas is within normal limits.

Spleen: The spleen is unremarkable in appearance.

Adrenals/Urinary Tract: The adrenal glands are unremarkable in
appearance. The kidneys are within normal limits. There is no
evidence of hydronephrosis. No renal or ureteral stones are
identified. No perinephric stranding is seen.

Stomach/Bowel: The stomach is unremarkable in appearance. The small
bowel is within normal limits. The patient is status post
appendectomy. The colon is unremarkable in appearance.

Vascular/Lymphatic: Scattered calcification is seen along the
abdominal aorta and its branches. The abdominal aorta is otherwise
grossly unremarkable. The inferior vena cava is grossly
unremarkable. No retroperitoneal lymphadenopathy is seen. No pelvic
sidewall lymphadenopathy is identified.

Reproductive: The bladder is mildly distended and grossly
unremarkable. The patient is status post hysterectomy. No suspicious
adnexal masses are seen.

Other: No additional soft tissue abnormalities are seen.

Musculoskeletal: No acute osseous abnormalities are identified.
Multiple posterior disc protrusions are noted along the lumbar
spine. There is mild grade 1 retrolisthesis of L2 on L3 and of L3 on
L4, and mild grade 1 anterolisthesis of L4 on L5. The visualized
musculature is unremarkable in appearance.
IMPRESSION: 1. No acute abnormality seen within the abdomen or pelvis.
2. Scattered aortic atherosclerosis.
3. Degenerative change along the lumbar spine, with multiple
posterior disc protrusions noted, similar in appearance to 9025.

## 2018-02-05 ENCOUNTER — Encounter: Payer: Self-pay | Admitting: Obstetrics and Gynecology

## 2018-02-05 ENCOUNTER — Ambulatory Visit (INDEPENDENT_AMBULATORY_CARE_PROVIDER_SITE_OTHER): Payer: Medicaid Other | Admitting: Obstetrics and Gynecology

## 2018-02-05 VITALS — BP 132/86 | HR 77 | Ht 65.0 in | Wt 171.6 lb

## 2018-02-05 DIAGNOSIS — N952 Postmenopausal atrophic vaginitis: Secondary | ICD-10-CM | POA: Diagnosis not present

## 2018-02-05 DIAGNOSIS — N898 Other specified noninflammatory disorders of vagina: Secondary | ICD-10-CM

## 2018-02-05 NOTE — Progress Notes (Signed)
Pt has some burning in the vaginal area.

## 2018-02-06 ENCOUNTER — Encounter: Payer: Self-pay | Admitting: Obstetrics and Gynecology

## 2018-02-06 NOTE — Progress Notes (Signed)
    GYNECOLOGY PROGRESS NOTE  Subjective:    Patient ID: Patricia Rollins, female    DOB: [birth year], 57 y.o.   MRN: [id number]  HPI  Patient is a 57 y.o. [id number] female who presents for complaints of vaginal irritation. Notes that she is using her Premarin cream as prescribed.  Also notes an occasional watery discharge.   Patient also notes concern as she states that there is a growth coming from her vagina.  Wonders if this is normal.   The following portions of the patient's history were reviewed and updated as appropriate: allergies, current medications, past family history, past medical history, past social history, past surgical history and problem list.  Review of Systems Pertinent items noted in HPI and remainder of comprehensive ROS otherwise negative.   Objective:   Blood pressure 132/86, pulse 77, height 5\' 5"  (1.651 m), weight 171 lb 9.6 oz (77.8 kg). General appearance: alert and no distress Pelvic: external genitalia normal, rectovaginal septum normal.  Vagina mildly atrophic,  with moderate amount of white cream, unable to detect any vaginal discharge due to cream.  No odor.  Hymenal ring with papillary projections present, one protruding just to the border of the introitus.  Cervix normal appearing, no lesions and no motion tenderness. Bimanual exam not performed.   Assessment:   Vaginal irritation Vaginal atrophy  Plan:   - Patient continues to complain of vaginal irritation despite use of vaginal estrogen therapy (has been at least 6-8 months).  Discussed possibility of current cream causing irritation (as we attempted to switch to Estrace cream in the past to reduce symptoms, however patient stated she didn't feel it worked as well for hr vaginal atrophy and desired to switch back to Premarin). Also patient could have other pathology such as VIN (patient is a long-time heavy smoker).  Would recommend a vaginal colposcopy to assess vaginal tissue.  At same time, patient  desires small papillary projections to be removed. Can remove at time of colposcopy.  To f/u in 2 weeks for procedure.    Rubie Maid, MD [facility]

## 2018-02-17 ENCOUNTER — Encounter: Payer: Medicaid Other | Admitting: Obstetrics and Gynecology

## 2018-03-04 ENCOUNTER — Ambulatory Visit: Payer: Medicaid Other | Admitting: Gastroenterology

## 2018-05-01 ENCOUNTER — Ambulatory Visit: Payer: Medicaid Other | Admitting: Gastroenterology

## 2018-05-01 ENCOUNTER — Encounter: Payer: Self-pay | Admitting: Gastroenterology

## 2018-05-01 VITALS — BP 130/80 | HR 82 | Ht 62.5 in | Wt 173.0 lb

## 2018-05-01 DIAGNOSIS — R109 Unspecified abdominal pain: Secondary | ICD-10-CM | POA: Diagnosis not present

## 2018-05-01 DIAGNOSIS — K5909 Other constipation: Secondary | ICD-10-CM

## 2018-05-01 DIAGNOSIS — M549 Dorsalgia, unspecified: Secondary | ICD-10-CM

## 2018-05-01 DIAGNOSIS — G8929 Other chronic pain: Secondary | ICD-10-CM

## 2018-05-01 DIAGNOSIS — Z720 Tobacco use: Secondary | ICD-10-CM | POA: Diagnosis not present

## 2018-05-01 DIAGNOSIS — R9389 Abnormal findings on diagnostic imaging of other specified body structures: Secondary | ICD-10-CM | POA: Diagnosis not present

## 2018-05-01 MED ORDER — DICYCLOMINE HCL 10 MG PO CAPS: 10.0000 mg | ORAL_CAPSULE | Freq: Three times a day (TID) | ORAL | 3 refills | Status: DC | PRN

## 2018-05-01 MED ORDER — LUBIPROSTONE 24 MCG PO CAPS: 24.0000 ug | ORAL_CAPSULE | Freq: Two times a day (BID) | ORAL | 0 refills | Status: DC

## 2018-05-01 NOTE — Patient Instructions (Addendum)
If you are age 57 or older, your body mass index should be between 23-30. Your Body mass index is 31.14 kg/m. If this is out of the aforementioned range listed, please consider follow up with your Primary Care Provider.  If you are age 62 or younger, your body mass index should be between 19-25. Your Body mass index is 31.14 kg/m. If this is out of the aformentioned range listed, please consider follow up with your Primary Care Provider.   We have sent the following medications to your pharmacy for you to pick up at your convenience: Bentyl 10 mg: Take 1 tablet by mouth every 8 hours as needed  We are giving you a sample of Amitiza 24 mcg: Take twice a day   Thank you for entrusting me with your care and for choosing [facility], Dr. Ogden Cellar

## 2018-05-01 NOTE — Progress Notes (Signed)
HPI :  57 year old female with a history of reported chronic pancreatitis on imaging, chronic abdominal pain, chronic back pain, constipation, referred here for new patient evaluation by Remi Haggard, FNP, for chronic abdominal pain and some of these issues.  This patient has chronic abdominal pain and bowel changes for which she has been seen by multiple GI providers [facility]) in recent years. She is here for another opinion on her symptoms. She reports she has had 2 years worth of ongoing lower abdominal pain. Her pain localizes in the lower abdomen diffusely across the areaand somewhat into the pelvis. She states he can come and go, it is not constant. She does experience pain most days of the week. She does have a rare day where she has no pain at all. She wonders if her pain is related to the discomfort in her lower back. She has chronic lower back pain for which she takes chronic narcotics and has been on them for several years. She reports the pain can be related 10 out of 10 at worst, it is usually related 6 obtain a baseline which she feels it. She often has the discomfort with the urge to have a bowel movement, and she feels that having a bowel movement can help it go away. She denies any blood in her stools. She has been using laxatives such as Dulcolax to help produce a bowel movement. She states she has been on MiraLAX in the past high-volume which did not help too much. She was previously on Linzess dosage 145 g day, she states this did help her feel better but she ran out of it. She denies any nausea or vomiting. She states she is eating okay, however does have a decrease in appetite if she is having pain. She endorses weight gain of roughly 20-25 pounds in the past year. She states she feels bloated and distended at times when she is not moving her bowels. If she does not take a laxative she will not have a bowel movement. She does endorse some pain in her mid  back through her chest sometimes. He endorses a lot of bloating which will bother her.  She has had an extensive evaluation in the past for her pain. This has included upper endoscopy and colonoscopy as outlined. Her upper endoscopy was remarkable for gastritis H. Pylori negative, she was placed on a variety of PPIs and Carafate which did not help her symptoms. She is taking tagamet which she states does make her bloating better. She is taking hydrocodone for spinal stenosis 3 times a day. She states this is a lower dose than she has been on in the past. On review of her chart she's had at least 3 CT scans in the past year (CT angio 04/22/2017, CT renal stone study 01/30/2017, CT scan 11/26/2017). She had no significant atherosclerosis or changes of mesenteric ischemia. Her most recent CT scan in January 2019 showed mild chronic appearing calcification of the body of the pancreas at junction of head and body, possibly consistent with "minimal changes of chronic pancreatitis". Pancreatic elastase normal, fecal fat normal. Negative nuclear medicine scan and [place] - 01/29/2018 - normal. GB okay, no gallstones. Colonoscopy 04/2017 showed one 65mm polyp in the descending colon. It appears that she had a breath test done to assess for bacterial overgrowth but I don't see the result of that. Reportedly had negative testing for celiac disease.   She states she has been on a  variety of PPIs in the past which have not provided any benefit, and can cause headache. Trial of Carafate has not provided any benefit. She denies any alcohol use. She has chronic tobacco use and continues to smoke.  Surgical history includes a total hysterectomy, appendectomy, and 3 vaginal births.  Of note the patient's mother was diagnosed with pancreatic cancer at age 38.  Colonoscopy: 05/08/17 (Dr. Bary Castilla) - 8 mm polyp in descending colon, otherwise normal; Endoscopy: 05/08/17 (Dr. Bary Castilla) - small hiatal hernia, diffuse and severely erythematous  mucosa in gastric cardia, patchy mild inflammation with erythema in prepyloric region    Past Medical History:  Diagnosis Date  . Anxiety   . Arthritis    patient has bilateral bursitis in hips  . CAD (coronary artery disease)   . Chronic pancreatitis ([place])    CT of Abd/Pelvis 12-06-17  . Colon polyp 05/08/2017   Dr Bary Castilla  . Depression   . Diverticulosis   . Fibromyalgia   . Hematuria 06/21/2016  . HLD (hyperlipidemia)   . IBS (irritable bowel syndrome)   . Neuromuscular disorder [facility])      Past Surgical History:  Procedure Laterality Date  . ABDOMINAL HYSTERECTOMY    . APPENDECTOMY    . COLONOSCOPY WITH PROPOFOL N/A 05/08/2017   Procedure: COLONOSCOPY WITH PROPOFOL;  Surgeon: Robert Bellow, MD;  Location: [facility] ENDOSCOPY;  Service: Endoscopy;  Laterality: N/A;  . ESOPHAGOGASTRODUODENOSCOPY (EGD) WITH PROPOFOL N/A 05/08/2017   Procedure: ESOPHAGOGASTRODUODENOSCOPY (EGD) WITH PROPOFOL;  Surgeon: Robert Bellow, MD;  Location: [facility] ENDOSCOPY;  Service: Endoscopy;  Laterality: N/A;  . OOPHORECTOMY Right   . TONSILLECTOMY AND ADENOIDECTOMY Bilateral    Family History  Problem Relation Age of Onset  . Ovarian cancer Mother   . Pancreatic cancer Mother   . Bladder Cancer Neg Hx   . Kidney cancer Neg Hx   . Prostate cancer Neg Hx    Social History   Tobacco Use  . Smoking status: Current Some Day Smoker    Packs/day: 0.25    Types: Cigarettes  . Smokeless tobacco: Never Used  Substance Use Topics  . Alcohol use: No    Alcohol/week: 0.0 oz  . Drug use: No   Current Outpatient Medications  Medication Sig Dispense Refill  . ALPRAZolam (XANAX) 1 MG tablet Take 1 tablet by mouth 3 (three) times daily.    Marland Kitchen aspirin EC 81 MG tablet Take by mouth.    . cimetidine (TAGAMET) 200 MG tablet Take 200 mg by mouth 2 (two) times daily.    Marland Kitchen conjugated estrogens (PREMARIN) vaginal cream Place 1 Applicatorful vaginally 2 (two) times a week. 42.5 g 12  . estrogens,  conjugated, (PREMARIN) 0.625 MG tablet Take 1 tablet (0.625 mg total) by mouth daily. 30 tablet 6  . HYDROcodone-acetaminophen (NORCO) 10-325 MG tablet Take 0.5 tablets by mouth 3 (three) times daily.    Marland Kitchen lovastatin (MEVACOR) 20 MG tablet Take 1 tablet (20 mg total) by mouth at bedtime. 90 tablet 3  . promethazine (PHENERGAN) 25 MG tablet Take 25 mg by mouth every 12 (twelve) hours as needed for nausea or vomiting.    . sucralfate (CARAFATE) 1 g tablet Take 1 g by mouth 4 (four) times daily -  with meals and at bedtime.    Marland Kitchen tiZANidine (ZANAFLEX) 4 MG tablet Take 4 mg by mouth every 6 (six) hours as needed for muscle spasms.    . Cholecalciferol (VITAMIN D3) 5000 units TABS Take 1 tablet by mouth  daily.     No current facility-administered medications for this visit.    Allergies  Allergen Reactions  . Sulfa Antibiotics Nausea Only     Review of Systems: All systems reviewed and negative except where noted in HPI.   Lab Results  Component Value Date   WBC 9.8 01/29/2017   HGB 14.1 01/29/2017   HCT 41.1 01/29/2017   MCV 92.1 01/29/2017   PLT 353 01/29/2017    Lab Results  Component Value Date   CREATININE 0.54 01/29/2017   BUN <5 (L) 01/29/2017   NA 138 01/29/2017   K 2.8 (L) 01/29/2017   CL 103 01/29/2017   CO2 27 01/29/2017    Lab Results  Component Value Date   ALT 12 (L) 01/29/2017   AST 24 01/29/2017   ALKPHOS 74 01/29/2017   BILITOT 0.4 01/29/2017     Physical Exam: BP 130/80 (BP Location: Right Arm, Patient Position: Sitting, Cuff Size: Normal)   Pulse 82   Ht 5' 2.5" (1.588 m) Comment: height measured without shoes  Wt 173 lb (78.5 kg)   BMI 31.14 kg/m  Constitutional: Pleasant,well-developed, female in no acute distress. HEENT: Normocephalic and atraumatic. Conjunctivae are normal. No scleral icterus. Neck supple.  Cardiovascular: Normal rate, regular rhythm.  Pulmonary/chest: Effort normal and breath sounds normal. No wheezing, rales or  rhonchi. Abdominal: Soft, nondistended, nontender. Negative [place] There are no masses palpable. No hepatomegaly. Extremities: no edema Lymphadenopathy: No cervical adenopathy noted. Neurological: Alert and oriented to person place and time. Skin: Skin is warm and dry. No rashes noted. Psychiatric: Normal mood and affect. Behavior is normal.   ASSESSMENT AND PLAN: 57 year old female here for another opinion for evaluation of chronic abdominal pain/constipation, abnormal imaging of her pancreas.  Chronic abdominal pain / chronic constipation / abnormal imaging pancreas - she's had an extensive evaluation in the past but multiple GI providers. She had subjectively concerned about her prior CT scan showing chronic pancreatitis and is wondering if her pain is related to her pancreas. Her pain is in the lower abdomen and related to bowel movements, and I think is unlikely to be related to her pancreas. She had mild calcific changes of her pancreas but her fecal fat pancreatic elastase are normal, no evidence of pancreatic insufficiency. There is also no evidence of any mass lesions of the pancreas in relation to her mother's history and reassured her about that. She has chronic constipation, likely slow transit and made worse by opioid use. She states she has come off opioids for 6 month at a time which did not help her constipation, that being said it may still be contributing. Given her relation to bowel movements and reliable improvement in her pain with a bowel movement, I suspect she may have severe IBS C, perhaps with a component of narcotic bowel driving this process and associated with her bloating. She also has chronic low back pain, a component of abdominal wall pain also possible but I could not elicit this on her exam today. I discussed options with her. She does think that she felt better on prior bowel regimen although Linzess she thinks caused some headache. We'll try her on Amitiza 38mcg BID,  free sample given, for treatment of IBS-C / opioid induced constipation, and see if this helps. I will also provide Bentyl 10 mg, take 1-2 tabs every 8 hours as needed. If this regimen does not help, will consider other options to include Movantik, or some of the new agents out  for IBS-C. We may also consider Cymbalta moving forward in attempt to wean off chronic narcotics.   Chronic back pain - as above, consider other modalities for pain control, this is possibly related to her abdominal pain  Tobacco use - counseled her that tobacco cessation is in her best interest. It is a risk factor for chronic pancreatitis and a multitude of other medical problems. She will work on this and let me know if we can be of assistance, she declined assistance for it at present time.  Ostrander Cellar, MD [facility]  CC: Remi Haggard, FNP

## 2018-05-08 ENCOUNTER — Other Ambulatory Visit: Payer: Self-pay

## 2018-05-08 ENCOUNTER — Telehealth: Payer: Self-pay | Admitting: Gastroenterology

## 2018-05-08 MED ORDER — LUBIPROSTONE 24 MCG PO CAPS: 24.0000 ug | ORAL_CAPSULE | Freq: Two times a day (BID) | ORAL | 2 refills | Status: DC

## 2018-05-08 NOTE — Progress Notes (Signed)
Thanks Jan 

## 2018-05-08 NOTE — Telephone Encounter (Signed)
Rx sent 

## 2018-05-08 NOTE — Progress Notes (Signed)
Sent Rx for Amitiza to pharmacy

## 2018-05-11 NOTE — Progress Notes (Deleted)
Cardiology Office Note  Date:  05/11/2018   ID:  ELIRA COLASANTI, [name] 01/01/1961, MRN [id number]  PCP:  Remi Haggard, FNP   No chief complaint on file.   HPI:   Miss Borunda is a pleasant 57 year old woman with  long history of smoking who continues to smoke one pack per day,  In nic inhaler COPD, CAD  PAD noted on CT scan of the chest and abdomen,  hyperlipidemia,  who for follow-up of her coronary disease and peripheral arterial disease.  Having stomach problems, bloating Pressure between shoulder blades after eating Scheduled for EGD, colo Tried protonix, and pepcidHad to stop the medication as she was having headaches  Recent CT scan abdomen for abdominal pain Results reviewed with her   CT scan chest and abdomen/pelvis performed in September and again in October 2017  CT scan  results discussed with her again today   mild to moderate coronary calcification in the mid LAD, proximal RCA Also has mild to moderate aortic arch calcification, moderate distal descending aorta disease, mild to moderate common iliac and bilateral femoral arterial disease   continues to smoke, trying to quit  She reports that she started smoking age 26  Tried chantix, had vivid dreams,    denies any chest pain concerning for angina  Has not been taking her lovastatin on a regular basis as it upsets her stomach   EKG personally reviewed by myself on todays visit Shows normal sinus rhythm with rate 91 bpm no significant ST or T-wave changes    PMH:   has a past medical history of Anxiety, Arthritis, CAD (coronary artery disease), Chronic pancreatitis ([place]), Colon polyp (05/08/2017), Depression, Diverticulosis, Fibromyalgia, Hematuria (06/21/2016), HLD (hyperlipidemia), IBS (irritable bowel syndrome), and Neuromuscular disorder ([place]).  PSH:    Past Surgical History:  Procedure Laterality Date  . ABDOMINAL HYSTERECTOMY    . APPENDECTOMY    . COLONOSCOPY WITH PROPOFOL N/A 05/08/2017   Procedure: COLONOSCOPY WITH PROPOFOL;  Surgeon: Robert Bellow, MD;  Location: [facility] ENDOSCOPY;  Service: Endoscopy;  Laterality: N/A;  . ESOPHAGOGASTRODUODENOSCOPY (EGD) WITH PROPOFOL N/A 05/08/2017   Procedure: ESOPHAGOGASTRODUODENOSCOPY (EGD) WITH PROPOFOL;  Surgeon: Robert Bellow, MD;  Location: [facility] ENDOSCOPY;  Service: Endoscopy;  Laterality: N/A;  . OOPHORECTOMY Right   . TONSILLECTOMY AND ADENOIDECTOMY Bilateral     Current Outpatient Medications  Medication Sig Dispense Refill  . ALPRAZolam (XANAX) 1 MG tablet Take 1 tablet by mouth 3 (three) times daily.    Marland Kitchen aspirin EC 81 MG tablet Take by mouth.    . Cholecalciferol (VITAMIN D3) 5000 units TABS Take 1 tablet by mouth daily.    . cimetidine (TAGAMET) 200 MG tablet Take 200 mg by mouth 2 (two) times daily.    Marland Kitchen conjugated estrogens (PREMARIN) vaginal cream Place 1 Applicatorful vaginally 2 (two) times a week. 42.5 g 12  . dicyclomine (BENTYL) 10 MG capsule Take 1 capsule (10 mg total) by mouth every 8 (eight) hours as needed for spasms. 30 capsule 3  . estrogens, conjugated, (PREMARIN) 0.625 MG tablet Take 1 tablet (0.625 mg total) by mouth daily. 30 tablet 6  . HYDROcodone-acetaminophen (NORCO) 10-325 MG tablet Take 0.5 tablets by mouth 3 (three) times daily.    Marland Kitchen lovastatin (MEVACOR) 20 MG tablet Take 1 tablet (20 mg total) by mouth at bedtime. 90 tablet 3  . lubiprostone (AMITIZA) 24 MCG capsule Take 1 capsule (24 mcg total) by mouth 2 (two) times daily with a meal. 60 capsule 2  .  promethazine (PHENERGAN) 25 MG tablet Take 25 mg by mouth every 12 (twelve) hours as needed for nausea or vomiting.    . sucralfate (CARAFATE) 1 g tablet Take 1 g by mouth 4 (four) times daily -  with meals and at bedtime.    Marland Kitchen tiZANidine (ZANAFLEX) 4 MG tablet Take 4 mg by mouth every 6 (six) hours as needed for muscle spasms.     No current facility-administered medications for this visit.      Allergies:   Sulfa antibiotics   Social  History:  The patient  reports that she has been smoking cigarettes.  She has been smoking about 0.25 packs per day. She has never used smokeless tobacco. She reports that she does not drink alcohol or use drugs.   Family History:   family history includes Ovarian cancer in her mother; Pancreatic cancer in her mother.    Review of Systems: Review of Systems  Constitutional: Negative.   Respiratory: Negative.   Cardiovascular: Negative.   Gastrointestinal: Positive for abdominal pain.       Bloating  Musculoskeletal: Negative.   Neurological: Negative.   Psychiatric/Behavioral: Negative.   All other systems reviewed and are negative.    PHYSICAL EXAM: VS:  There were no vitals taken for this visit. , BMI There is no height or weight on file to calculate BMI. GEN: Well nourished, well developed, in no acute distress  HEENT: normal  Neck: no JVD, carotid bruits, or masses Cardiac: RRR; no murmurs, rubs, or gallops,no edema  Respiratory:  clear to auscultation bilaterally, normal work of breathing GI: soft, nontender, nondistended, + BS MS: no deformity or atrophy  Skin: warm and dry, no rash Neuro:  Strength and sensation are intact Psych: euthymic mood, full affect    Recent Labs: No results found for requested labs within last 8760 hours.    Lipid Panel Lab Results  Component Value Date   CHOL 205 (H) 07/18/2016   HDL 54 07/18/2016   LDLCALC 122 07/18/2016   TRIG 143 07/18/2016      Wt Readings from Last 3 Encounters:  05/01/18 173 lb (78.5 kg)  02/05/18 171 lb 9.6 oz (77.8 kg)  10/09/17 163 lb (73.9 kg)       ASSESSMENT AND PLAN:  Preop cardiovascular Acceptable risk for EGD and colonoscopy No further testing needed  Smoker Long discussion concerning smoking cessation She does not want Chantix or other modalities Strongly recommended she quit  Coronary artery disease involving coronary bypass graft of native heart without angina pectoris Currently  with no symptoms of angina. No further workup at this time. Continue current medication regimen.  Aortic atherosclerosis ([facility])  mild to moderate in nature,  Recommended she stay on her cholesterol medication, square smoking   PAD (peripheral artery disease) ([place])  denies any claudication type symptoms  Mild to moderate disease of the common iliac, femoral arteries bilaterally, distal descending aorta   Pure hypercholesterolemia Taking lovastatin inconsistently secondary to stomach issues recommended when she takes medication on a regular basis for at least 2 months we would recheck lipid panel    Total encounter time more than 25 minutes  Greater than 50% was spent in counseling and coordination of care with the patient   Disposition:   F/U  12 months  No orders of the defined types were placed in this encounter.    Signed, Esmond Plants, M.D., Ph.D. 05/11/2018  [place], [place]

## 2018-05-13 ENCOUNTER — Ambulatory Visit: Payer: Medicaid Other | Admitting: Cardiovascular Disease

## 2018-06-01 NOTE — Progress Notes (Signed)
Cardiology Office Note  Date:  06/04/2018   ID:  Patricia Rollins, Patricia Rollins 06/13/1961, MRN [id number]  PCP:  Remi Haggard, FNP   Chief Complaint  Patient presents with  . Other    12 month follow up. Meds reviewed by the pt. verbally. "doing well."     HPI:  Patricia Rollins is a pleasant 58 year old woman with  long history of smoking who continues to smoke one pack per day,  In nic inhaler COPD, CAD  PAD noted on CT scan of the chest and abdomen,  hyperlipidemia,  who for follow-up of her coronary disease and peripheral arterial disease.  In follow-up she reports that she continues to have Stomach pain, She has completed Lots of workup, numerous imaging studies, seen GI No clear etiology of her discomfort Reports that his lower abdomen Pain often relieved with bowel movement Often has chronic upper back pain when she has lower bowel movement pain Back pain resolved after bowel movement When "spine locks up", bowel blocks up Pressure between shoulder blades after eating  "better with gas/belching"  Tried protonix, and pepcid Had to stop the medication as she was having headaches  Denies any significant chest pain on exertion Was told by primary care that cholesterol was elevated on lovastatin 20 Was told to increase up to 40 mg daily, she has not done this yet   continues to smoke, She reports that she started smoking age 35  Tried chantix, had vivid dreams,  No desire to quit at this time  EKG personally reviewed by myself on todays visit Shows normal sinus rhythm rate 72 bpm no significant ST or T-wave changes  Other past medical history reviewed CT scan chest and abdomen/pelvis performed in September and again in October 2017  CT scan  results discussed with her again today   mild to moderate coronary calcification in the mid LAD, proximal RCA Also has mild to moderate aortic arch calcification, moderate distal descending aorta disease, mild to moderate common iliac and  bilateral femoral arterial disease    PMH:   has a past medical history of Anxiety, Arthritis, CAD (coronary artery disease), Chronic pancreatitis ([place]), Colon polyp (05/08/2017), Depression, Diverticulosis, Fibromyalgia, Hematuria (06/21/2016), HLD (hyperlipidemia), IBS (irritable bowel syndrome), and Neuromuscular disorder ([place]).  PSH:    Past Surgical History:  Procedure Laterality Date  . ABDOMINAL HYSTERECTOMY    . APPENDECTOMY    . COLONOSCOPY WITH PROPOFOL N/A 05/08/2017   Procedure: COLONOSCOPY WITH PROPOFOL;  Surgeon: Robert Bellow, MD;  Location: [facility] ENDOSCOPY;  Service: Endoscopy;  Laterality: N/A;  . ESOPHAGOGASTRODUODENOSCOPY (EGD) WITH PROPOFOL N/A 05/08/2017   Procedure: ESOPHAGOGASTRODUODENOSCOPY (EGD) WITH PROPOFOL;  Surgeon: Robert Bellow, MD;  Location: [facility] ENDOSCOPY;  Service: Endoscopy;  Laterality: N/A;  . OOPHORECTOMY Right   . TONSILLECTOMY AND ADENOIDECTOMY Bilateral     Current Outpatient Medications  Medication Sig Dispense Refill  . ALPRAZolam (XANAX) 1 MG tablet Take 1 tablet by mouth 3 (three) times daily.    Marland Kitchen aspirin EC 81 MG tablet Take by mouth.    . Cholecalciferol (VITAMIN D3) 5000 units TABS Take 1 tablet by mouth daily.    . cimetidine (TAGAMET) 200 MG tablet Take 200 mg by mouth 2 (two) times daily.    Marland Kitchen conjugated estrogens (PREMARIN) vaginal cream Place 1 Applicatorful vaginally 2 (two) times a week. 42.5 g 12  . estrogens, conjugated, (PREMARIN) 0.625 MG tablet Take 1 tablet (0.625 mg total) by mouth daily. 30 tablet 6  .  HYDROcodone-acetaminophen (NORCO) 10-325 MG tablet Take 0.5 tablets by mouth 3 (three) times daily.    Marland Kitchen lovastatin (MEVACOR) 20 MG tablet Take 1 tablet (20 mg total) by mouth at bedtime. 90 tablet 3  . promethazine (PHENERGAN) 25 MG tablet Take 25 mg by mouth every 12 (twelve) hours as needed for nausea or vomiting.    Marland Kitchen tiZANidine (ZANAFLEX) 4 MG tablet Take 4 mg by mouth every 6 (six) hours as needed for muscle  spasms.     No current facility-administered medications for this visit.      Allergies:   Fluoxetine; Other; Sulfa antibiotics; and Sulfasalazine   Social History:  The patient  reports that she has been smoking cigarettes.  She has been smoking about 0.25 packs per day. She has never used smokeless tobacco. She reports that she does not drink alcohol or use drugs.   Family History:   family history includes Ovarian cancer in her mother; Pancreatic cancer in her mother.    Review of Systems: Review of Systems  Constitutional: Negative.   Respiratory: Negative.   Cardiovascular: Negative.   Gastrointestinal: Positive for abdominal pain.       Bloating  Musculoskeletal: Positive for back pain.  Neurological: Negative.   Psychiatric/Behavioral: Negative.   All other systems reviewed and are negative.    PHYSICAL EXAM: VS:  BP 110/80 (BP Location: Left Arm, Patient Position: Sitting, Cuff Size: Normal)   Pulse 72   Ht 5\' 4"  (1.626 m)   Wt 176 lb 12 oz (80.2 kg)   BMI 30.34 kg/m  , BMI Body mass index is 30.34 kg/m. Constitutional:  oriented to person, place, and time. No distress.  HENT:  Head: Normocephalic and atraumatic.  Eyes:  no discharge. No scleral icterus.  Neck: Normal range of motion. Neck supple. No JVD present.  Cardiovascular: Normal rate, regular rhythm, normal heart sounds and intact distal pulses. Exam reveals no gallop and no friction rub. No edema No murmur heard. Pulmonary/Chest: Effort normal and breath sounds normal. No stridor. No respiratory distress.  no wheezes.  no rales.  no tenderness.  Abdominal: Soft.  no distension.  no tenderness.  Musculoskeletal: Normal range of motion.  no  tenderness or deformity.  Neurological:  normal muscle tone. Coordination normal. No atrophy Skin: Skin is warm and dry. No rash noted. not diaphoretic.  Psychiatric:  normal mood and affect. behavior is normal. Thought content normal.   Recent Labs: No results  found for requested labs within last 8760 hours.    Lipid Panel Lab Results  Component Value Date   CHOL 205 (H) 07/18/2016   HDL 54 07/18/2016   LDLCALC 122 07/18/2016   TRIG 143 07/18/2016      Wt Readings from Last 3 Encounters:  06/04/18 176 lb 12 oz (80.2 kg)  05/01/18 173 lb (78.5 kg)  02/05/18 171 lb 9.6 oz (77.8 kg)       ASSESSMENT AND PLAN:  Smoker Long discussion concerning smoking cessation She does not want Chantix or other modalities Does not seem to have any desire to quit smoking right now  Coronary artery disease involving coronary bypass graft of native heart without angina pectoris Currently with no symptoms of angina. No further workup at this time. Continue current medication regimen.stable Recommended smoking cessation  Aortic atherosclerosis (HCC)  mild to moderate in nature,  Suggested she increase lovastatin to 40 mg daily and check her cholesterol in 3 months time Order for lipids provided for 3 months  PAD (  peripheral artery disease) (HCC) No claudication symptoms Mild to moderate disease of the common iliac, femoral arteries bilaterally, distal descending aorta Recommended smoking cessation   Pure hypercholesterolemia Lovastatin up to 40 mg daily   Total encounter time more than 25 minutes  Greater than 50% was spent in counseling and coordination of care with the patient   Disposition:   F/U  As needed   Orders Placed This Encounter  Procedures  . EKG 12-Lead     Signed, Esmond Plants, M.D., Ph.D. 06/04/2018  [place], [place]

## 2018-06-04 ENCOUNTER — Encounter: Payer: Self-pay | Admitting: Cardiovascular Disease

## 2018-06-04 ENCOUNTER — Ambulatory Visit (INDEPENDENT_AMBULATORY_CARE_PROVIDER_SITE_OTHER): Payer: Medicaid Other | Admitting: Cardiovascular Disease

## 2018-06-04 VITALS — BP 110/80 | HR 72 | Ht 64.0 in | Wt 176.8 lb

## 2018-06-04 DIAGNOSIS — I739 Peripheral vascular disease, unspecified: Secondary | ICD-10-CM | POA: Diagnosis not present

## 2018-06-04 DIAGNOSIS — N280 Ischemia and infarction of kidney: Secondary | ICD-10-CM | POA: Diagnosis not present

## 2018-06-04 DIAGNOSIS — I700 Atherosclerosis of aorta: Secondary | ICD-10-CM

## 2018-06-04 DIAGNOSIS — E7800 Pure hypercholesterolemia, unspecified: Secondary | ICD-10-CM

## 2018-06-04 DIAGNOSIS — I25118 Atherosclerotic heart disease of native coronary artery with other forms of angina pectoris: Secondary | ICD-10-CM

## 2018-06-04 DIAGNOSIS — R079 Chest pain, unspecified: Secondary | ICD-10-CM

## 2018-06-04 DIAGNOSIS — R0789 Other chest pain: Secondary | ICD-10-CM

## 2018-06-04 DIAGNOSIS — F17200 Nicotine dependence, unspecified, uncomplicated: Secondary | ICD-10-CM

## 2018-06-04 MED ORDER — LOVASTATIN 40 MG PO TABS: 40.0000 mg | ORAL_TABLET | Freq: Every day | ORAL | 3 refills | Status: DC

## 2018-06-04 NOTE — Patient Instructions (Addendum)
Medication Instructions:   Please increase lovastatin to 40 mg daily  Labwork:  We order a fasting lipid panel in lobby of hospital  Testing/Procedures:  No further testing at this time   Follow-Up: It was a pleasure seeing you in the office today. Please call us if you have new issues that need to be addressed before your next appt.  [phone]  Your physician wants you to follow-up in: as needed  If you need a refill on your cardiac medications before your next appointment, please call your pharmacy.  For educational health videos Log in to : [email or web address] Or : [email or web address], password : [email or web address]

## 2018-06-19 ENCOUNTER — Telehealth: Payer: Self-pay | Admitting: Obstetrics and Gynecology

## 2018-06-19 NOTE — Telephone Encounter (Signed)
The patient called today asking if Dr. Marcelline Mates could refill her estrogens, conjugated, (PREMARIN) 0.625 MG tablet [[id number]] and patient was informed that Dr. Marcelline Mates and her Nurse are out until next week and message would be forwarded to another nurse.  She was ok with that.  Please advise, thanks.

## 2018-06-20 MED ORDER — ESTROGENS CONJUGATED 0.625 MG PO TABS: 0.6250 mg | ORAL_TABLET | Freq: Every day | ORAL | 1 refills | Status: DC

## 2018-06-20 NOTE — Telephone Encounter (Signed)
Pt aware med erx. R/s colpo.

## 2018-07-02 ENCOUNTER — Telehealth: Payer: Self-pay | Admitting: Cardiovascular Disease

## 2018-07-02 NOTE — Telephone Encounter (Signed)
Pt c/o medication issue:  1. Name of Medication: Lovastatin   2. How are you currently taking this medication (dosage and times per day)? Was taking 20 mg and now is taking 40 mg   3. Are you having a reaction (difficulty breathing--STAT)?   4. What is your medication issue? Was advised if she's not able to tolerate it to cut the 40 mg and she has been doing this but states it still its being tolerated well She is calling to see if we can just put her back on the 20 mg   Please advise

## 2018-07-02 NOTE — Telephone Encounter (Signed)
S/w patient. She has been having all over aching of her body with taking lovastatin 40 mg. She would like to go back on the 20 mg and would need a new prescription. Advised I will make Dr Rockey Situ aware and send in new Rx once he reviews to make sure not any other changes. She was appreciative.

## 2018-07-09 ENCOUNTER — Other Ambulatory Visit (HOSPITAL_COMMUNITY)
Admission: RE | Admit: 2018-07-09 | Discharge: 2018-07-09 | Disposition: A | Discharge status: Home / Self Care | Payer: Medicaid Other | Source: Ambulatory Visit | Attending: Obstetrics and Gynecology | Admitting: Obstetrics and Gynecology

## 2018-07-09 ENCOUNTER — Telehealth: Payer: Self-pay | Admitting: Obstetrics and Gynecology

## 2018-07-09 ENCOUNTER — Encounter: Payer: Self-pay | Admitting: Obstetrics and Gynecology

## 2018-07-09 ENCOUNTER — Ambulatory Visit (INDEPENDENT_AMBULATORY_CARE_PROVIDER_SITE_OTHER): Payer: Medicaid Other | Admitting: Obstetrics and Gynecology

## 2018-07-09 VITALS — BP 113/86 | HR 81 | Ht 64.0 in | Wt 172.3 lb

## 2018-07-09 DIAGNOSIS — N9089 Other specified noninflammatory disorders of vulva and perineum: Secondary | ICD-10-CM

## 2018-07-09 DIAGNOSIS — N952 Postmenopausal atrophic vaginitis: Secondary | ICD-10-CM

## 2018-07-09 DIAGNOSIS — N898 Other specified noninflammatory disorders of vagina: Secondary | ICD-10-CM

## 2018-07-09 NOTE — Telephone Encounter (Signed)
Pt was called no answer LM via voicemail to call the office to speak more about her concerns before her colpo.

## 2018-07-09 NOTE — Progress Notes (Signed)
   PT is present today for a colpo. Pt stated that she is doing well no complaints.   

## 2018-07-09 NOTE — Telephone Encounter (Signed)
Pt called back and stated that she wanted to know if she was going to be able to work Friday and Saturday after her colpo. Pt was aware that she should be able to work.

## 2018-07-09 NOTE — Patient Instructions (Signed)

## 2018-07-09 NOTE — Telephone Encounter (Signed)
The patient is due in today at 2 PM for a colpo; however, she is asking for the nurse to contact her as she has questions prior to her visit, and she stated she is coming today, please advise, thanks.

## 2018-07-10 ENCOUNTER — Other Ambulatory Visit: Payer: Self-pay | Admitting: Anesthesiology

## 2018-07-10 DIAGNOSIS — G894 Chronic pain syndrome: Secondary | ICD-10-CM

## 2018-07-10 DIAGNOSIS — M545 Low back pain: Secondary | ICD-10-CM

## 2018-07-10 NOTE — Progress Notes (Signed)
   [facility] [address] [place], [place]  [address] Phone:  [phone]   Fax:  [phone]   GYNECOLOGY CLINIC COLPOSCOPY PROCEDURE NOTE  57 y.o. [phone] here for colposcopy for chronic vaginal itching and irritation, not relieved by local estrogen therapy.  Patient has risk factors for dysplaia including chronic smoking. No longer receives pap smears as she has a past history of hysterectomy.   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    Patient given informed consent, signed copy in the chart, time out was performed.  Placed in lithotomy position. Cervix viewed with speculum and colposcope after application of acetic acid.   Colposcopy adequate? Yes  faint acetowhite lesion(s) noted at center of vaginal cuff, and left labia minora; corresponding biopsies obtained. Hemostasis was achieved with silver nitrate sticks.  All specimens were labeled and sent to pathology.    Next, the hymenal ring was injected with 1% lidocaine and the excess tissue was excised with a scalpel. The hymen was then approximated with 3-0 Vicryl.   Patient was given post procedure instructions.  Will follow up pathology and manage accordingly; patient will be contacted with results and recommendations.  Routine preventative health maintenance measures emphasized.    Rubie Maid, MD [facility]

## 2018-07-13 NOTE — Telephone Encounter (Signed)
Okay to decrease lovastatin if she has side effects To keep cholesterol low could add zetia 10 mg daily or try different statin

## 2018-07-15 MED ORDER — LOVASTATIN 20 MG PO TABS: 20.0000 mg | ORAL_TABLET | Freq: Every day | ORAL | 3 refills | Status: DC

## 2018-07-15 NOTE — Telephone Encounter (Signed)
Returned the call to the patient. She stated that she would rather wait to add the Zetia until she has had her labs drawn to see what her choleterol level is with just the Lovastain 20 mg. Orders are already placed for fasting lipid.

## 2018-07-21 ENCOUNTER — Ambulatory Visit: Payer: Medicaid Other

## 2018-07-28 ENCOUNTER — Other Ambulatory Visit: Payer: Self-pay | Admitting: Obstetrics and Gynecology

## 2018-08-05 ENCOUNTER — Ambulatory Visit: Payer: Medicaid Other

## 2018-08-15 ENCOUNTER — Ambulatory Visit: Admission: RE | Admit: 2018-08-15 | Payer: Medicaid Other | Source: Ambulatory Visit

## 2018-08-22 ENCOUNTER — Other Ambulatory Visit: Payer: Self-pay | Admitting: Obstetrics and Gynecology

## 2018-09-06 ENCOUNTER — Ambulatory Visit
Admission: RE | Admit: 2018-09-06 | Discharge: 2018-09-06 | Disposition: A | Discharge status: Home / Self Care | Payer: Medicaid Other | Source: Ambulatory Visit | Attending: Anesthesiology | Admitting: Anesthesiology

## 2018-09-06 DIAGNOSIS — M545 Low back pain, unspecified: Secondary | ICD-10-CM

## 2018-09-06 DIAGNOSIS — M48061 Spinal stenosis, lumbar region without neurogenic claudication: Secondary | ICD-10-CM | POA: Insufficient documentation

## 2018-09-06 DIAGNOSIS — G894 Chronic pain syndrome: Secondary | ICD-10-CM | POA: Diagnosis not present

## 2018-09-06 DIAGNOSIS — M47896 Other spondylosis, lumbar region: Secondary | ICD-10-CM | POA: Diagnosis not present

## 2018-09-06 DIAGNOSIS — M4316 Spondylolisthesis, lumbar region: Secondary | ICD-10-CM | POA: Insufficient documentation

## 2018-10-28 ENCOUNTER — Ambulatory Visit: Payer: Medicaid Other | Admitting: Student in an Organized Health Care Education/Training Program

## 2018-12-02 ENCOUNTER — Ambulatory Visit: Payer: Medicaid Other | Admitting: Student in an Organized Health Care Education/Training Program

## 2019-01-08 ENCOUNTER — Ambulatory Visit: Payer: Medicaid Other | Admitting: Student in an Organized Health Care Education/Training Program

## 2019-01-22 ENCOUNTER — Ambulatory Visit: Payer: Medicaid Other | Admitting: Student in an Organized Health Care Education/Training Program

## 2019-02-12 ENCOUNTER — Ambulatory Visit: Payer: Medicaid Other | Admitting: Student in an Organized Health Care Education/Training Program

## 2019-02-16 ENCOUNTER — Telehealth (INDEPENDENT_AMBULATORY_CARE_PROVIDER_SITE_OTHER): Payer: Medicaid Other | Admitting: Urology

## 2019-02-16 ENCOUNTER — Telehealth: Payer: Self-pay | Admitting: Urology

## 2019-02-16 ENCOUNTER — Other Ambulatory Visit: Payer: Self-pay

## 2019-02-16 DIAGNOSIS — R109 Unspecified abdominal pain: Secondary | ICD-10-CM

## 2019-02-16 DIAGNOSIS — R3129 Other microscopic hematuria: Secondary | ICD-10-CM

## 2019-02-16 DIAGNOSIS — F17200 Nicotine dependence, unspecified, uncomplicated: Secondary | ICD-10-CM

## 2019-02-16 NOTE — Telephone Encounter (Signed)
Called pt she states that she would like to have a CT scan or "whatever procedure or scan" that will show if there is a stone "where my pee hole is" advised pt that I would check with you to see if indeed a CT scan would be appropriate. Please advise, thanks

## 2019-02-16 NOTE — Progress Notes (Signed)
Virtual Visit via Telephone Note  I connected with Patricia Rollins on 02/16/19 at  9:00 AM EDT by telephone and verified that I am speaking with the correct person using two identifiers.   I discussed the limitations, risks, security and privacy concerns of performing an evaluation and management service by telephone and the availability of in person appointments. I also discussed with the patient that there may be a patient responsible charge related to this service. The patient expressed understanding and agreed to proceed.   History of Present Illness: 58 year old smoker referred back for further evaluation of microscopic hematuria.  Patient was initially seen and evaluated in 2017 for microscopic hematuria.  She underwent CT urogram and cystoscopy at that time both of which were negative.  Since then, she is had numerous additional scans including a CT renal stone protocol in 01/2017 when she felt that she was having a stone episode which again showed no stones.  Her most recent abdominal ultrasound on 01/2018 also showed no stones.  Despite having no radiographic evidence of kidney stones, she continues to feel that she does not fact have kidney stones.  She believes that she passed  A stone 5 months ago.    Today, her primary complaint is chronic right flank pain over the past 1+ years.  It comes and goes.  Is worse in the morning when she gets out of bed and seems to be positionally related.  It runs down the back of her right thigh.  She is worried this is from a kidney stone.  She denies any gross hematuria.  In review of her referral chart, she had 2+ blood on dip but no microscopic exam was performed.  She continues to smoke but recently started on Wellbutrin and is trying to quit.  Observations/Objective: Somewhat frustrated on telephone conversation today.  Assessment and Plan:  1. Microscopic hematuria We discussed that given her smoking history and ongoing presence of blood in  her urine, I would strongly recommend repeat cystoscopy, last cystoscopy 2017.  We discussed that without repeating the study, there be no way to rule out the possibility of bladder cancer for which she is at higher risk given her history.  We will have her return after the COVID-19 crisis has resolved for UA/office cystoscopy.  2. Smoker Encouraged smoking cessation, congratulated for actively trying Causative relationship between smoking and bladder cancer were reviewed again today  3. Right flank pain Ms. Sando is quite convinced that she has a right kidney stone although all imaging done in the past do not show evidence of this Based on her history and symptoms, highly suspicious that this is musculoskeletal in nature She somewhat irritated and frustrated today as she feels that her referral was for kidney stones when in fact it was for microscopic hematuria.  She is having difficulty differentiating the 2 problems as separate issues which I spent quite some time trying to explain today.  Ultimately, it would not be unreasonable to repeat renal imaging in the form of renal ultrasound given that is been a year since her last image and her ongoing right flank pain.  She was advised that if she does in fact pass another stone, she should bring into the office.  Additionally, he was advised if her pain becomes severe she is not able to tolerate p.o. or accompanied by fever, she should go to the emergency room.  She understands this.   Follow Up Instructions: F/u in 1-2 months with cystoscopy/ RUS  I discussed the assessment and treatment plan with the patient. The patient was provided an opportunity to ask questions and all were answered. The patient agreed with the plan and demonstrated an understanding of the instructions.   The patient was advised to call back or seek an in-person evaluation if the symptoms worsen or if the condition fails to improve as anticipated.  I provided 12  minutes of non-face-to-face time during this encounter.   Hollice Espy, MD

## 2019-02-16 NOTE — Telephone Encounter (Signed)
Patient called and is concerned about maybe having a stone stuck inside of her ureter. She said that she wants to make sure that we are ordering the correct scans so that if there is one we are able to see it?   Sharyn Lull

## 2019-02-17 NOTE — Telephone Encounter (Signed)
Please read my note.  Renal ultrasound was ordered.  Please call see if we can get this scheduled sooner rather than later.    Hollice Espy, MD

## 2019-02-17 NOTE — Telephone Encounter (Signed)
I reached out to scheduling and they will call her to schedule   Patricia Rollins

## 2019-02-18 ENCOUNTER — Ambulatory Visit: Payer: Medicaid Other | Admitting: Urology

## 2019-02-19 ENCOUNTER — Telehealth: Payer: Self-pay | Admitting: Urology

## 2019-02-19 NOTE — Telephone Encounter (Signed)
Patient notified

## 2019-02-19 NOTE — Telephone Encounter (Signed)
Pt called office and stated scheduling told her they weren't doing imaging until after 6/1, unless it was STAT.  Pt says she doesn't feel like this is an emergency.  Please give pt a call.

## 2019-02-19 NOTE — Telephone Encounter (Signed)
Left pt mess to call, per Dr. Cherrie Gauze note RUS is to be repeated due to flank pain

## 2019-02-19 NOTE — Telephone Encounter (Signed)
Left pt mess to call and clarify if she would like a stat RUS or wait until june

## 2019-02-19 NOTE — Telephone Encounter (Signed)
Pt lmom asking why does she need to get another RUS when she just had one last year. Pt asking why can't we go back and look at last years imaging and wants someone to call her and let her know if getting the RUS is necessary and needed. Please advise pt at [phone]

## 2019-03-17 ENCOUNTER — Ambulatory Visit: Payer: Medicaid Other

## 2019-03-24 ENCOUNTER — Ambulatory Visit: Payer: Medicaid Other

## 2019-03-25 ENCOUNTER — Other Ambulatory Visit: Payer: Medicaid Other | Admitting: Urology

## 2019-03-25 ENCOUNTER — Encounter: Payer: Self-pay | Admitting: Urology

## 2019-03-26 ENCOUNTER — Ambulatory Visit: Payer: Medicaid Other

## 2019-04-01 ENCOUNTER — Telehealth: Payer: Self-pay | Admitting: Urology

## 2019-04-01 NOTE — Telephone Encounter (Signed)
Patient called the office today after receiving a no show letter for her cystoscopy appointment.  I attempted to explain to her that during her virtual visit with Dr. Erlene Quan, she recommended that she have a renal US and a cystoscopy.  Both the [place] and cysto were scheduled - patient cancelled the [place] and no showed for the cysto.  Patient does not understand why she needs to have the cystoscopy.  I attempted to review Dr. Cherrie Gauze notes with her.  I scheduled a virtual visit with Dr. Erlene Quan on 6/2 for them to discuss again.  I offered to assist in getting her [place] rescheduled as well.  Patient called the office back and is requesting a call from a member of our clinical staff to better explain the need for a cysto.

## 2019-04-02 NOTE — Telephone Encounter (Signed)
Patient is scheduled for an ultrasound 5/28 and a follow up office visit with Dr. Erlene Quan on 6/2.  She does not want to have a cystoscopy at this time.

## 2019-04-09 ENCOUNTER — Ambulatory Visit: Payer: Medicaid Other

## 2019-04-10 ENCOUNTER — Ambulatory Visit: Payer: Medicaid Other

## 2019-04-14 ENCOUNTER — Telehealth: Payer: Self-pay | Admitting: Urology

## 2019-04-14 ENCOUNTER — Ambulatory Visit: Payer: Medicaid Other | Attending: Urology

## 2019-04-14 ENCOUNTER — Ambulatory Visit: Payer: Medicaid Other | Admitting: Urology

## 2019-04-14 NOTE — Telephone Encounter (Signed)
Patient called because her RUS was scheduled on the same day as her follow up app per Dr. Erlene Quan ok to cx app and she will call patient with the results.   Sharyn Lull

## 2019-05-11 ENCOUNTER — Ambulatory Visit
Admission: RE | Admit: 2019-05-11 | Discharge: 2019-05-11 | Disposition: A | Discharge status: Home / Self Care | Payer: Medicaid Other | Source: Ambulatory Visit | Attending: Family Medicine | Admitting: Family Medicine

## 2019-05-11 ENCOUNTER — Other Ambulatory Visit: Payer: Self-pay

## 2019-05-11 ENCOUNTER — Other Ambulatory Visit: Payer: Self-pay | Admitting: Family Medicine

## 2019-05-11 ENCOUNTER — Ambulatory Visit
Admission: RE | Admit: 2019-05-11 | Discharge: 2019-05-11 | Disposition: A | Discharge status: Home / Self Care | Payer: Medicaid Other | Source: Ambulatory Visit | Attending: Urology | Admitting: Urology

## 2019-05-11 DIAGNOSIS — R109 Unspecified abdominal pain: Secondary | ICD-10-CM | POA: Insufficient documentation

## 2019-05-11 DIAGNOSIS — M545 Low back pain, unspecified: Secondary | ICD-10-CM

## 2019-05-12 ENCOUNTER — Telehealth: Payer: Self-pay | Admitting: Family Medicine

## 2019-05-12 NOTE — Telephone Encounter (Signed)
Again, there is no stones on any of our imaging including this renal ultrasound.  All of the CTs in our system show no stones.    Hollice Espy, MD

## 2019-05-12 NOTE — Telephone Encounter (Signed)
Gave results to Patricia Rollins. She is questioning where her second stone is? I do not see any notes in the chart about a second stone. Please advise.

## 2019-05-12 NOTE — Telephone Encounter (Signed)
This patient has never had a documented stone.  She is had multiple CT scans and ultrasounds which showed no stones. This past ultrasound shows no stones.  Continue to recommend cystoscopy again.  Ultimately, is her decision but I would strongly recommend it.  Hollice Espy, MD

## 2019-05-12 NOTE — Telephone Encounter (Signed)
Patient notified and states she will call our office back to schedule a Cystoscopy. She wants to think about it.

## 2019-05-12 NOTE — Telephone Encounter (Signed)
Patient notified

## 2019-05-12 NOTE — Telephone Encounter (Signed)
LMOM for patient to return call.

## 2019-05-12 NOTE — Telephone Encounter (Signed)
-----   Message from Hollice Espy, MD sent at 05/12/2019  9:04 AM EDT ----- Renal ultrasound looks good.    Hollice Espy, MD

## 2019-05-12 NOTE — Telephone Encounter (Signed)
Patient called back and states she also had a Spine image yesterday and on the impression it states she has a probable stone on the upper pole of the left kidney. Please advise.

## 2019-06-09 ENCOUNTER — Encounter: Payer: Medicaid Other | Admitting: Obstetrics and Gynecology

## 2019-07-21 ENCOUNTER — Other Ambulatory Visit: Payer: Self-pay

## 2019-07-21 ENCOUNTER — Encounter: Payer: Self-pay | Admitting: Obstetrics and Gynecology

## 2019-07-21 ENCOUNTER — Ambulatory Visit (INDEPENDENT_AMBULATORY_CARE_PROVIDER_SITE_OTHER): Payer: Medicaid Other | Admitting: Obstetrics and Gynecology

## 2019-07-21 ENCOUNTER — Telehealth: Payer: Self-pay | Admitting: Urology

## 2019-07-21 VITALS — Ht 64.0 in

## 2019-07-21 DIAGNOSIS — R310 Gross hematuria: Secondary | ICD-10-CM

## 2019-07-21 DIAGNOSIS — T887XXA Unspecified adverse effect of drug or medicament, initial encounter: Secondary | ICD-10-CM

## 2019-07-21 DIAGNOSIS — N952 Postmenopausal atrophic vaginitis: Secondary | ICD-10-CM

## 2019-07-21 NOTE — Telephone Encounter (Signed)
Patient called today asking for a follow up appointment with you to discuss her stone that is blocking her kidney. I advised her that per our last conversation that she was advised to follow up with a cysto in the office after her scans showed no stones. She declined to schedule a cysto stating that she did not want to have one done. I told her I would discuss and call her back. Please advise   Thanks, Sharyn Lull

## 2019-07-21 NOTE — Progress Notes (Signed)
Televisit.pt called and transferred from the front desk. Reviewed medication and other records and made updates. Pt stated that she has not been taking Vit D3 and wanted to know if she should be still taking it. No other problems or concerns.

## 2019-07-21 NOTE — Telephone Encounter (Signed)
We have had several visits as well as conversations regarding her concern for kidney stone.  The imaging does not support that there is a stone.  I have recommended that she undergo cystoscopy which she is refused now multiple times.  Our conversations have been circular and we do not seem to be progressing in terms of our doctor-patient relationship.  I think the patient would be best served by getting a second opinion from a different urologist.  We can set her up with somebody else in this office.  Hollice Espy, MD

## 2019-07-21 NOTE — Progress Notes (Signed)
Virtual Visit via Telephone Note  I connected with Patricia Rollins on 07/21/19 at  4:20 PM EDT by telephone and verified that I am speaking with the correct person using two identifiers.   I discussed the limitations, risks, security and privacy concerns of performing an evaluation and management service by telephone and the availability of in person appointments. I also discussed with the patient that there may be a patient responsible charge related to this service. The patient expressed understanding and agreed to proceed.   History of Present Illness:  Patricia Rollins is a 58 y.o. [id number] female who presented for a medication check.  She notes that she was prescribed a higher dose Vitamin D (5,000 mIU daily).  She reports that she stopped taking Vitamin D after several weeks due to severe hot flushes.  Reports that after she stopped taking the Vitamin D, her hot flushes stopped. (However review of chart notes no recent prescription for Vitamin D).   She also reports that since March, she has been treated multiple times for vaginal infections (bacterial vaginosis) since March with Flagyl by PCP. Notes that her PCP offered her a trial of Vagifem as she thought that the Premarin cream may be causing some chronic irritation of the vagina. Patient has questions about the use of this medication. Also questioning if she should still be taking her Premarin tablets for HRT.   Lastly, patient reports that she is having some blood in the urine.  Is supposed to be seen by a Urologist, but has not yet had an appointment.  Notes that she does have a history of kidney stones. Also is a smoker, so is somewhat concerned that this could be related to cancer (as she has episodes of blood in the urine before).     Observations/Objective:  No blood pressure or pulse recorded due as this is a televist. Patient reports height 5'4. Weight, patient unsure, last weight recorded over 1 year ago, 172 lbs.    Assessment  and Plan:  1. Vitamin D side effects - patient noting side effects of Vitamin D use, however no recent prescriptions noted in chart.  Advised that she can take a daily supplement, notes she also takes Ensure and wonders if she gets Vitamin D from there. Advised that she would need her levels checked before resuming any higher dose of the medication. 2. Atrophic vaginitis - patient reports being changed from Premarin cream to Vagifem by her PCP due to thoughts that it is aggravating her vaginitis. Patient seeks clarification on use of the medication. Discussed that patient can consider a trial of weaning as she has been on the medication for several years and desires to wean. Patient notes that she will work on weaning.  Also discussed OTC and natural remedies if hot flushes resume if she does not desire to stay on HRT.  3. Hematuria - patient notes a history of kidney stones, has an appointment with Urology. Has been worked up in the past. Review of chart however notes multiple several phone conversations over the past several months with Urology and no findings of kidney stones on her CT and declining cystoscopy for workup of her hematuria. However patient notes that she is going to see a different provider in the process, and will follow up as recommended as she is slightly concerned about her risk of bladder cancer due to her history.   Follow Up Instructions:   I discussed the assessment and treatment plan with the patient.  The patient was provided an opportunity to ask questions and all were answered. The patient agreed with the plan and demonstrated an understanding of the instructions.    The patient was advised to call back or seek an in-person evaluation if the symptoms worsen or if the condition fails to improve as anticipated.  I provided 18 minutes of non-face-to-face time during this encounter.   Rubie Maid, MD [facility]

## 2019-07-30 ENCOUNTER — Telehealth (INDEPENDENT_AMBULATORY_CARE_PROVIDER_SITE_OTHER): Payer: Medicaid Other | Admitting: Urology

## 2019-07-30 ENCOUNTER — Other Ambulatory Visit: Payer: Self-pay

## 2019-07-30 DIAGNOSIS — R3121 Asymptomatic microscopic hematuria: Secondary | ICD-10-CM

## 2019-07-30 NOTE — Progress Notes (Signed)
Virtual Visit via Telephone Note  I connected with Patricia Rollins on 07/30/19 at  2:45 PM EDT by telephone and verified that I am speaking with the correct person using two identifiers.   I discussed the limitations, risks, security and privacy concerns of performing an evaluation and management service by telephone and the availability of in person appointments. We discussed the impact of the COVID-19 pandemic on the healthcare system, and the importance of social distancing and reducing patient and provider exposure. I also discussed with the patient that there may be a patient responsible charge related to this service. The patient expressed understanding and agreed to proceed.  Reason for visit: Microscopic hematuria  History of Present Illness: I had phone follow-up with Patricia Rollins today for microscopic hematuria.  She is a 58 year old female that was previously followed by Dr. Erlene Quan in our office.  She was referred to me for a second opinion on her microscopic hematuria.  She has an extensive smoking history, and has a history of a negative CT urogram and cystoscopy with Dr. Erlene Quan in 2017.  That CT scan did show a small vascular calcification near the left kidney that was not within the collecting system.  The patient reports that she is concerned her left-sided flank pain is from a kidney stone, as multiple x-rays performed for back pain show a calcification near the left kidney.  I reassured her that this was consistent with the vascular calcification previously seen on CT in 2017.  She also has a negative renal ultrasound from June 2020 that shows no hydronephrosis or stones.  The patient perseverates extensively about her possible stone seen on x-ray, and I did my best to reassure her that this was likely a vascular calcification based on her prior CT scans.  I discussed with the patient that the only way to be [id number] certain would be to repeat her CT scan, but all her imaging points to a  calcification in the vascular system near the left kidney.  She does not want to repeat a CT as she has had multiple CTs in the past.  I also recommended repeating her microscopic hematuria work-up with cystoscopy to rule out any bladder tumors with her persistent microscopic hematuria and smoking history.  She is very worried that she may have a bladder tumor, however she wants to know if there are any other diagnostic options besides cystoscopy and CT.  I stressed with her at length that these are the standard of care recommended by the AUA guidelines to work-up microscopic hematuria.  Assessment and Plan: I recommended repeating microscopic hematuria work-up with at least cystoscopy, and consideration of repeat CT urogram.  The patient is very opposed to a CT, but she will think about cystoscopy.  She will contact us with how she would like to proceed.  I again provided extensive reassurance that the left-sided calcification is most likely a vascular calcification based on her prior CT scans, negative renal ultrasound from June 2020.    I discussed the assessment and treatment plan with the patient. The patient was provided an opportunity to ask questions and all were answered. The patient agreed with the plan and demonstrated an understanding of the instructions.   The patient was advised to call back or seek an in-person evaluation if the symptoms worsen or if the condition fails to improve as anticipated.  I provided 25 minutes of non-face-to-face time during this encounter.   Billey Co, MD

## 2019-07-31 ENCOUNTER — Telehealth: Payer: Self-pay | Admitting: Urology

## 2019-07-31 NOTE — Telephone Encounter (Signed)
Pt called and was questioning what the pill was that you recommended she take "that makes her pee orange". I asked her if it was AZO. She states that was it, she also asked at what point were we going to do a UA. Please advise.

## 2019-08-03 NOTE — Telephone Encounter (Signed)
Spoke to patient and she states her kidney is what is bothering her the most. She is concerned about the blood in her urine. She is considering having the CT and Cysto done per your last televisit. She states she did have a CT done in 11/2017 at [facility] and she wants to make sure you have reviewed that image. She feels the AZO will not benefit her for kidney pain.

## 2019-08-03 NOTE — Telephone Encounter (Signed)
Azo fine for dysuria. Please set her up for ua and clinic visit with me in 6 months.  Thanks Nickolas Madrid, MD 08/03/2019

## 2019-08-03 NOTE — Telephone Encounter (Signed)
LMOM for patient to return call.

## 2019-08-03 NOTE — Telephone Encounter (Signed)
Pt LMOM and would like a call back before scheduling the CT.

## 2019-08-03 NOTE — Telephone Encounter (Signed)
Please see my note for details. I have reviewed her CT and discussed this with her extensively. I recommended CT hematuria and cysto for microscopic hematuria work up and she was not sure she wanted to complete those. She does not have any kidney stones or other causes of "kidney pain" that are urologic.  She can get CTU and cysto as recommended, or follow up in 6 months with another ua and discuss again in person.  Nickolas Madrid, MD 08/03/2019

## 2019-08-04 NOTE — Telephone Encounter (Signed)
Patient notified and wants to have her urine checked. She does not have symptoms but would like the urine to be on file.

## 2019-08-05 ENCOUNTER — Other Ambulatory Visit: Payer: Self-pay

## 2019-08-05 DIAGNOSIS — Z87448 Personal history of other diseases of urinary system: Secondary | ICD-10-CM

## 2019-08-06 ENCOUNTER — Other Ambulatory Visit: Payer: Self-pay

## 2019-08-06 NOTE — Telephone Encounter (Signed)
Pt called to cancel her lab appt for UA, pt also stated her urinary tract felt like it was on fire, I recommended that the pt come to see Sam this afternoon or tomorrow (Friday 9/25) pt refused those days, she is scheduled to come in Monday morning to see Sam and was advised to go to ER if she experiences fever, chills, nausea or vomiting over the weekend or symptoms worsen. Pt voiced understanding.   FYI

## 2019-08-10 ENCOUNTER — Ambulatory Visit: Payer: Medicaid Other | Admitting: Physician Assistant

## 2019-08-10 ENCOUNTER — Encounter: Payer: Self-pay | Admitting: Physician Assistant

## 2019-08-13 ENCOUNTER — Other Ambulatory Visit: Payer: Self-pay | Admitting: Obstetrics and Gynecology

## 2019-09-03 ENCOUNTER — Encounter: Payer: Medicaid Other | Admitting: Obstetrics and Gynecology

## 2019-11-19 ENCOUNTER — Other Ambulatory Visit: Payer: Self-pay | Admitting: Obstetrics and Gynecology

## 2019-11-20 NOTE — Telephone Encounter (Signed)
Patient needs an appointment (annual) prior to next refill.

## 2019-12-01 ENCOUNTER — Encounter: Payer: Medicaid Other | Admitting: Obstetrics and Gynecology

## 2019-12-01 ENCOUNTER — Other Ambulatory Visit: Payer: Self-pay

## 2020-01-07 ENCOUNTER — Other Ambulatory Visit: Payer: Self-pay

## 2020-01-07 ENCOUNTER — Ambulatory Visit (INDEPENDENT_AMBULATORY_CARE_PROVIDER_SITE_OTHER): Payer: Medicaid Other | Admitting: Surgery

## 2020-01-07 ENCOUNTER — Encounter: Payer: Self-pay | Admitting: Surgery

## 2020-01-07 ENCOUNTER — Ambulatory Visit: Payer: Self-pay | Admitting: Surgery

## 2020-01-07 VITALS — BP 113/78 | HR 85 | Temp 98.1°F | Resp 12 | Ht 65.0 in | Wt 196.4 lb

## 2020-01-07 DIAGNOSIS — K432 Incisional hernia without obstruction or gangrene: Secondary | ICD-10-CM

## 2020-01-07 NOTE — Patient Instructions (Addendum)
Start taking fiber gummies, increase your fiber diet and increase your water intake to help with the constipation.   Our surgery scheduler will contact you to schedule your surgery. Please have the [place] when she calls you.   Please call the office if you have any questions or concerns.   High-Fiber Diet Fiber, also called dietary fiber, is a type of carbohydrate that is found in fruits, vegetables, whole grains, and beans. A high-fiber diet can have many health benefits. Your health care provider may recommend a high-fiber diet to help:  Prevent constipation. Fiber can make your bowel movements more regular.  Lower your cholesterol.  Relieve the following conditions: ? Swelling of veins in the anus (hemorrhoids). ? Swelling and irritation (inflammation) of specific areas of the digestive tract (uncomplicated diverticulosis). ? A problem of the large intestine (colon) that sometimes causes pain and diarrhea (irritable bowel syndrome, IBS).  Prevent overeating as part of a weight-loss plan.  Prevent heart disease, type 2 diabetes, and certain cancers. What is my plan? The recommended daily fiber intake in grams (g) includes:  38 g for men age 80 or younger.  30 g for men over age 16.  68 g for women age 80 or younger.  21 g for women over age 32. You can get the recommended daily intake of dietary fiber by:  Eating a variety of fruits, vegetables, grains, and beans.  Taking a fiber supplement, if it is not possible to get enough fiber through your diet. What do I need to know about a high-fiber diet?  It is better to get fiber through food sources rather than from fiber supplements. There is not a lot of research about how effective supplements are.  Always check the fiber content on the nutrition facts label of any prepackaged food. Look for foods that contain 5 g of fiber or more per serving.  Talk with a diet and nutrition specialist (dietitian) if you have questions  about specific foods that are recommended or not recommended for your medical condition, especially if those foods are not listed below.  Gradually increase how much fiber you consume. If you increase your intake of dietary fiber too quickly, you may have bloating, cramping, or gas.  Drink plenty of water. Water helps you to digest fiber. What are tips for following this plan?  Eat a wide variety of high-fiber foods.  Make sure that half of the grains that you eat each day are whole grains.  Eat breads and cereals that are made with whole-grain flour instead of refined flour or white flour.  Eat brown rice, bulgur wheat, or millet instead of white rice.  Start the day with a breakfast that is high in fiber, such as a cereal that contains 5 g of fiber or more per serving.  Use beans in place of meat in soups, salads, and pasta dishes.  Eat high-fiber snacks, such as berries, raw vegetables, nuts, and popcorn.  Choose whole fruits and vegetables instead of processed forms like juice or sauce. What foods can I eat?  Fruits Berries. Pears. Apples. Oranges. Avocado. Prunes and raisins. Dried figs. Vegetables Sweet potatoes. Spinach. Kale. Artichokes. Cabbage. Broccoli. Cauliflower. Green peas. Carrots. Squash. Grains Whole-grain breads. Multigrain cereal. Oats and oatmeal. Brown rice. Barley. Bulgur wheat. Blanket. Quinoa. Bran muffins. Popcorn. Rye wafer crackers. Meats and other proteins Navy, kidney, and pinto beans. Soybeans. Split peas. Lentils. Nuts and seeds. Dairy Fiber-fortified yogurt. Beverages Fiber-fortified soy milk. Fiber-fortified orange juice. Other foods  Fiber bars. The items listed above may not be a complete list of recommended foods and beverages. Contact a dietitian for more options. What foods are not recommended? Fruits Fruit juice. Cooked, strained fruit. Vegetables Fried potatoes. Canned vegetables. Well-cooked vegetables. Grains White bread. Pasta  made with refined flour. White rice. Meats and other proteins Fatty cuts of meat. Fried chicken or fried fish. Dairy Milk. Yogurt. Cream cheese. Sour cream. Fats and oils Butters. Beverages Soft drinks. Other foods Cakes and pastries. The items listed above may not be a complete list of foods and beverages to avoid. Contact a dietitian for more information. Summary  Fiber is a type of carbohydrate. It is found in fruits, vegetables, whole grains, and beans.  There are many health benefits of eating a high-fiber diet, such as preventing constipation, lowering blood cholesterol, helping with weight loss, and reducing your risk of heart disease, diabetes, and certain cancers.  Gradually increase your intake of fiber. Increasing too fast can result in cramping, bloating, and gas. Drink plenty of water while you increase your fiber.  The best sources of fiber include whole fruits and vegetables, whole grains, nuts, seeds, and beans. This information is not intended to replace advice given to you by your health care provider. Make sure you discuss any questions you have with your health care provider. Document Revised: 09/02/2017 Document Reviewed: 09/02/2017 Elsevier Patient Education  2020 [employer].

## 2020-01-07 NOTE — Progress Notes (Signed)
Patient ID: Patricia Rollins, female   DOB: [birth year], 59 y.o.   MRN: [id number]  Chief Complaint: Lower abdominal pain  History of Present Illness Patricia Rollins is a 59 y.o. female with infraumbilical lower abdominal pain.  Patient had prior GYN laparoscopic procedure in the region.  Has a subcutaneous palpable mass present with associated discomfort.  The lump does not change with movement.  She denies fevers, chills and nausea, vomiting.  She does report some suprapubic pain which improves with bowel movements.  She admits to constipation and moving her bowels and on average daily.  Pain is exacerbated by straining and lifting.  Past Medical History Past Medical History:  Diagnosis Date  . Anxiety   . Arthritis    patient has bilateral bursitis in hips  . CAD (coronary artery disease)   . Chronic pancreatitis ([place])    CT of Abd/Pelvis 12-06-17  . Colon polyp 05/08/2017   Dr Bary Castilla  . Depression   . Diverticulosis   . Fibromyalgia   . Hematuria 06/21/2016  . HLD (hyperlipidemia)   . IBS (irritable bowel syndrome)   . Neuromuscular disorder [facility])       Past Surgical History:  Procedure Laterality Date  . ABDOMINAL HYSTERECTOMY    . APPENDECTOMY    . COLONOSCOPY WITH PROPOFOL N/A 05/08/2017   Procedure: COLONOSCOPY WITH PROPOFOL;  Surgeon: Robert Bellow, MD;  Location: [facility] ENDOSCOPY;  Service: Endoscopy;  Laterality: N/A;  . ESOPHAGOGASTRODUODENOSCOPY (EGD) WITH PROPOFOL N/A 05/08/2017   Procedure: ESOPHAGOGASTRODUODENOSCOPY (EGD) WITH PROPOFOL;  Surgeon: Robert Bellow, MD;  Location: [facility] ENDOSCOPY;  Service: Endoscopy;  Laterality: N/A;  . OOPHORECTOMY Right   . TONSILLECTOMY AND ADENOIDECTOMY Bilateral     Allergies  Allergen Reactions  . Fluoxetine Other (See Comments)    Confusion Other reaction(s): Other (See Comments) Confusion Other reaction(s): Unknown confusion  . Other Nausea Only and Other (See Comments)    Other reaction(s): Unknown   . Sulfa  Antibiotics Nausea Only  . Sulfasalazine Nausea Only    Current Outpatient Medications  Medication Sig Dispense Refill  . ALPRAZolam (XANAX) 1 MG tablet Take 1 tablet by mouth 3 (three) times daily.    Marland Kitchen aspirin EC 81 MG tablet Take by mouth.    . Cholecalciferol (VITAMIN D3) 5000 units TABS Take 1 tablet by mouth daily.    . cimetidine (TAGAMET) 200 MG tablet Take 200 mg by mouth 2 (two) times daily.    Marland Kitchen HYDROcodone-acetaminophen (NORCO) 10-325 MG tablet Take 0.5 tablets by mouth 3 (three) times daily.    Marland Kitchen lovastatin (MEVACOR) 20 MG tablet Take 1 tablet (20 mg total) by mouth at bedtime. 90 tablet 3  . PREMARIN 0.625 MG tablet TAKE 1 TABLET BY MOUTH ONCE A DAY 60 tablet 0  . PREMARIN vaginal cream INSERT 1 APPLICATORFUL VAGINALLY TWICE AWEEK 30 g 11  . promethazine (PHENERGAN) 25 MG tablet Take 25 mg by mouth every 12 (twelve) hours as needed for nausea or vomiting.    Marland Kitchen tiZANidine (ZANAFLEX) 4 MG tablet Take 4 mg by mouth every 6 (six) hours as needed for muscle spasms.     No current facility-administered medications for this visit.    Family History Family History  Problem Relation Age of Onset  . Ovarian cancer Mother   . Pancreatic cancer Mother   . Bladder Cancer Neg Hx   . Kidney cancer Neg Hx   . Prostate cancer Neg Hx       Social History  Social History   Tobacco Use  . Smoking status: Current Some Day Smoker    Packs/day: 0.25    Types: Cigarettes  . Smokeless tobacco: Never Used  Substance Use Topics  . Alcohol use: No    Alcohol/week: 0.0 standard drinks  . Drug use: No        Review of Systems  Constitutional: Negative.   HENT: Negative.   Eyes: Negative.   Respiratory: Negative.   Cardiovascular: Negative.   Gastrointestinal: Positive for abdominal pain and constipation. Negative for blood in stool, diarrhea, melena, nausea and vomiting.  Genitourinary: Positive for hematuria.  Musculoskeletal: Negative.   Skin: Negative.   Neurological:  Negative.   Endo/Heme/Allergies: Negative.       Physical Exam Blood pressure 113/78, pulse 85, temperature 98.1 F (36.7 C), resp. rate 12, height 5\' 5"  (1.651 m), weight 196 lb 6.4 oz (89.1 kg), SpO2 96 %. Last Weight  Most recent update: 01/07/2020  2:52 PM   Weight  89.1 kg (196 lb 6.4 oz)            CONSTITUTIONAL: Well developed, and nourished, appropriately responsive and aware without distress.   EYES: Sclera non-icteric.   EARS, NOSE, MOUTH AND THROAT: Mask worn.   Hearing is intact to voice.  NECK: Trachea is midline, and there is no jugular venous distension.  LYMPH NODES:  Lymph nodes in the neck are not enlarged. RESPIRATORY:  Lungs are clear, and breath sounds are equal bilaterally. Normal respiratory effort without pathologic use of accessory muscles. CARDIOVASCULAR: Heart is regular in rate and rhythm. GI: The abdomen is soft, nontender, and nondistended. There is an infraumbilical 2 cm masse.  Tender on palpation no change with Valsalva.  MUSCULOSKELETAL:  Symmetrical muscle tone appreciated in all four extremities.    SKIN: Skin turgor is normal. No pathologic skin lesions appreciated.  NEUROLOGIC:  Motor and sensation appear grossly normal.  Cranial nerves are grossly without defect. PSYCH:  Alert and oriented to person, place and time. Affect is appropriate for situation.  Data Reviewed I have personally reviewed what is currently available of the patient's imaging, recent labs and medical records.   Labs:  CBC Latest Ref Rng & Units 01/29/2017 07/18/2016  WBC 3.6 - 11.0 K/uL 9.8 7.3  Hemoglobin 12.0 - 16.0 g/dL 14.1 13.5  Hematocrit 35.0 - 47.0 % 41.1 41.2  Platelets 150 - 440 K/uL 353 319   CMP Latest Ref Rng & Units 01/29/2017 12/27/2016 07/18/2016  Glucose 65 - 99 mg/dL 118(H) 102(H) 82  BUN 6 - 20 mg/dL <5(L) 7 15  Creatinine 0.44 - 1.00 mg/dL 0.54 0.59 0.62  Sodium 135 - 145 mmol/L 138 142 143  Potassium 3.5 - 5.1 mmol/L 2.8(L) 4.1 4.1  Chloride 101 - 111  mmol/L 103 106 105  CO2 22 - 32 mmol/L 27 26 27   Calcium 8.9 - 10.3 mg/dL 8.8(L) 8.9 9.0  Total Protein 6.5 - 8.1 g/dL 7.2 - 6.8  Total Bilirubin 0.3 - 1.2 mg/dL 0.4 - 0.2  Alkaline Phos 38 - 126 U/L 74 - 55  AST 15 - 41 U/L 24 - 11  ALT 14 - 54 U/L 12(L) - 6      Imaging: Radiology review: Reviewed previous CT scan from approximately 2 years ago and could identify a small fascial defect as well as a small umbilical defect.  These are consistent with our findings.  The umbilical defect is asymptomatic. Within last 24 hrs: No results found.  Assessment  Infraumbilical, focal incisional hernia.  Previous CT scan confirms incarcerated preperitoneal adipose. Patient Active Problem List   Diagnosis Date Noted  . Vaginal atrophy 06/28/2017  . Coronary artery disease involving native coronary artery of native heart 03/06/2017  . Stable angina pectoris ([place]) 03/06/2017  . Renal infarct ([place]) 02/26/2017  . Abdominal pain 02/26/2017  . Midline thoracic back pain 02/01/2017  . LUQ abdominal tenderness 01/03/2017  . Renal lesion 01/03/2017  . Spinal stenosis of lumbar region 12/25/2016  . Epigastric abdominal pain 11/21/2016  . Constipation 10/23/2016  . Lipoma of chest wall 10/23/2016  . Coronary artery disease involving coronary bypass graft of native heart without angina pectoris 09/13/2016  . Aortic atherosclerosis ([place]) 09/13/2016  . PAD (peripheral artery disease) ([place]) 09/13/2016  . Pure hypercholesterolemia 09/13/2016  . Marijuana use 08/09/2016  . Chest pain of uncertain etiology [id number]  . Chronic lower extremity pain (Bilateral) (L>R) 07/26/2016  . Chronic upper extremity pain (Bilateral) (L>R) 07/26/2016  . Tobacco use disorder 07/25/2016  . Cigarette nicotine dependence 07/25/2016  . Lumbar facet syndrome (Bilateral) (R>L) 07/25/2016  . Chronic sacroiliac joint pain (Bilateral) (R>L) 07/25/2016  . Chronic shoulder pain (Left) 07/25/2016  . Peripheral neuropathy  (H[facility]) (lower extremity) (Bilateral) (L>R) 07/25/2016  . Neuropathic pain 07/25/2016  . Musculoskeletal pain 07/25/2016  . Chronic neck pain (Bilateral) (L>R) 07/25/2016  . Long term current use of opiate analgesic 07/24/2016  . Encounter for therapeutic drug level monitoring 07/24/2016  . Encounter for pain management planning 07/24/2016  . Long term prescription benzodiazepine use 07/24/2016  . Chronic hip pain (Location of Primary Source of Pain) (Bilateral) (R>L) 07/24/2016  . Annual physical exam 07/18/2016  . Colon cancer screening 07/18/2016  . Tongue lesion 06/21/2016  . Abnormal mammogram 11/08/2015  . Trochanteric bursitis (Bilateral) 08/16/2015  . Hot flash, menopausal 08/16/2015  . Compulsive tobacco user syndrome 08/16/2015  . Chronic low back pain (Location of Secondary source of pain) (Bilateral) (R>L) 08/15/2015  . CD (contact dermatitis) 08/15/2015  . Menopausal and perimenopausal disorder 08/15/2015  . Chronic lumbar pain 05/12/2015  . Fibromyalgia 05/12/2015  . Generalized anxiety disorder 05/12/2015  . Chronic pain disorder 07/12/2010  . Lumbosacral neuritis 02/23/2010    Plan    We will pursue primary repair of incisional hernia of the midline lower abdomen. I discussed possibility of incarceration, strangulation, enlargement in size over time, and the risk of emergency surgery in the face of strangulation.  Also discussed the risk of surgery including recurrence which can be up to 30% in the case of complex hernias, use of prosthetic materials (mesh) and the increased risk of infxn, post-op infxn and the possible need for re-operation and removal of mesh if used, possibility of post-op SBO or ileus, and the risks of general anesthetic including MI, CVA, sudden death or even reaction to anesthetic medications. The patient and family understands the risks, any and all questions were answered to the patient's satisfaction.  Face-to-face time spent with the patient and  accompanying care providers(if present) was 30 minutes, with more than 50% of the time spent counseling, educating, and coordinating care of the patient.      Ronny Bacon M.D., FACS 01/07/2020, 4:22 PM

## 2020-01-11 ENCOUNTER — Telehealth: Payer: Self-pay | Admitting: Surgery

## 2020-01-11 NOTE — Telephone Encounter (Signed)
Outbound call made to the patient & message left requesting a call back to review the following info:  Surgery Date: 01/29/20 Preadmission Testing Date: 01/18/20 (8a-1p) Covid Testing Date: 01/27/20 - patient advised to go to the [facility] ([place])  Patient has been made aware to call [phone], between 1-3:00pm the day before surgery, to find out what time to arrive.

## 2020-01-12 ENCOUNTER — Ambulatory Visit: Payer: Self-pay | Admitting: Surgery

## 2020-01-13 NOTE — Telephone Encounter (Signed)
Patient returned call & has been made aware of all surgery/prep dates.  Cheryllynn acknowledged understanding & thanked.

## 2020-01-14 ENCOUNTER — Other Ambulatory Visit: Payer: Self-pay | Admitting: Obstetrics and Gynecology

## 2020-01-18 ENCOUNTER — Encounter: Admission: RE | Admit: 2020-01-18 | Payer: Medicaid Other | Source: Ambulatory Visit

## 2020-01-18 ENCOUNTER — Other Ambulatory Visit: Payer: Self-pay | Admitting: Obstetrics and Gynecology

## 2020-01-19 ENCOUNTER — Other Ambulatory Visit: Payer: Self-pay

## 2020-01-19 ENCOUNTER — Encounter
Admission: RE | Admit: 2020-01-19 | Discharge: 2020-01-19 | Disposition: A | Discharge status: Home / Self Care | Payer: Medicaid Other | Source: Ambulatory Visit | Attending: Surgery | Admitting: Surgery

## 2020-01-19 DIAGNOSIS — Z01812 Encounter for preprocedural laboratory examination: Secondary | ICD-10-CM | POA: Insufficient documentation

## 2020-01-19 HISTORY — DX: Gastro-esophageal reflux disease without esophagitis: K21.9

## 2020-01-19 NOTE — Patient Instructions (Signed)
Your procedure is scheduled [id number] 3/19  Report to Day Surgery. To find out your arrival time please call [phone] between 1PM - 3PM on Thurs. 3/18.  Remember: Instructions that are not followed completely may result in serious medical risk,  up to and including death, or upon the discretion of your surgeon and anesthesiologist your  surgery may need to be rescheduled.     _X__ 1. Do not eat food after midnight the night before your procedure.                 No gum chewing or hard candies. You may drink clear liquids up to 2 hours                 before you are scheduled to arrive for your surgery- DO not drink clear                 liquids within 2 hours of the start of your surgery.                 Clear Liquids include:  water, apple juice without pulp, clear Gatorade, G2 or                  Gatorade Zero (avoid Red/Purple/Blue), Black Coffee or Tea (Do not add                 anything to coffee or tea). _____2.   Complete the carbohydrate drink provided to you, 2 hours before arrival.  __X__2.  On the morning of surgery brush your teeth with toothpaste and water, you                may rinse your mouth with mouthwash if you wish.  Do not swallow any toothpaste of mouthwash.     ___ 3.  No Alcohol for 24 hours before or after surgery.   _X__ 4.  Do Not Smoke or use e-cigarettes For 24 Hours Prior to Your Surgery.                 Do not use any chewable tobacco products for at least 6 hours prior to                 surgery.  ____  5.  Bring all medications with you on the day of surgery if instructed.   __x__  6.  Notify your doctor if there is any change in your medical condition      (cold, fever, infections).     Do not wear jewelry, make-up, hairpins, clips or nail polish. Do not wear lotions, powders, or perfumes. You may wear deodorant. Do not shave 48 hours prior to surgery. Men may shave face and neck. Do not bring valuables to the  hospital.    [facility] is not responsible for any belongings or valuables.  Contacts, dentures or bridgework may not be worn into surgery. Leave your suitcase in the car. After surgery it may be brought to your room. For patients admitted to the hospital, discharge time is determined by your treatment team.   Patients discharged the day of surgery will not be allowed to drive home.   Make arrangements for someone to be with you for the first 24 hours of your Same Day Discharge.    Please read over the following fact sheets that you were given:      __x__ Take these medicines the morning of surgery with A SIP OF WATER:  1. ALPRAZolam (XANAX) 1 MG tablet  2. cimetidine (TAGAMET) 200 MG tablet    3. HYDROcodone-acetaminophen (NORCO) 10-325 MG tablet  4.loratadine (CLARITIN) 10 MG tablet  5.  6.  ____ Fleet Enema (as directed)   __x__ Use CHG Soap (or wipes) as directed  ____ Use Benzoyl Peroxide Gel as instructed  ____ Use inhalers on the day of surgery  ____ Stop metformin 2 days prior to surgery    ____ Take 1/2 of usual insulin dose the night before surgery. No insulin the morning          of surgery.   __x__ Stop aspirin on 3/13  __x__ Stop Anti-inflammatories no ibuprofen aleve after 3/12   ____ Stop supplements until after surgery.  May continue vitamin D

## 2020-01-19 NOTE — Pre-Procedure Instructions (Signed)
Patient instructed to come in anytime between 8-3 Mon thru Fri to have labs and EKG done before the day of Covid test to make sure we have adequate time to review results before surgery. She will call to let us know what day she can come.

## 2020-01-20 ENCOUNTER — Ambulatory Visit (INDEPENDENT_AMBULATORY_CARE_PROVIDER_SITE_OTHER): Payer: Medicaid Other | Admitting: Obstetrics and Gynecology

## 2020-01-20 ENCOUNTER — Encounter: Payer: Self-pay | Admitting: Obstetrics and Gynecology

## 2020-01-20 VITALS — Ht 65.0 in | Wt 196.0 lb

## 2020-01-20 DIAGNOSIS — Z79890 Hormone replacement therapy (postmenopausal): Secondary | ICD-10-CM

## 2020-01-20 DIAGNOSIS — Z7689 Persons encountering health services in other specified circumstances: Secondary | ICD-10-CM

## 2020-01-20 DIAGNOSIS — E785 Hyperlipidemia, unspecified: Secondary | ICD-10-CM | POA: Diagnosis not present

## 2020-01-20 MED ORDER — LOVASTATIN 20 MG PO TABS: 20.0000 mg | ORAL_TABLET | Freq: Every day | ORAL | 3 refills | Status: DC

## 2020-01-20 MED ORDER — ESTROGENS CONJUGATED 0.625 MG PO TABS: 0.6250 mg | ORAL_TABLET | Freq: Every day | ORAL | 11 refills | Status: DC

## 2020-01-20 MED ORDER — LOVASTATIN 20 MG PO TABS: 20.0000 mg | ORAL_TABLET | Freq: Every day | ORAL | 0 refills | Status: DC

## 2020-01-20 NOTE — Progress Notes (Signed)
Televisit-pt having visit for medication review. Pt stated that the medication is working well for her.

## 2020-01-20 NOTE — Progress Notes (Signed)
Virtual Visit via Telephone Note  I connected with Patricia Rollins on 01/20/20 at  1:30 PM EST by telephone and verified that I am speaking with the correct person using two identifiers.   I discussed the limitations, risks, security and privacy concerns of performing an evaluation and management service by telephone and the availability of in person appointments. I also discussed with the patient that there may be a patient responsible charge related to this service. The patient expressed understanding and agreed to proceed.   History of Present Illness: Patricia Rollins is a 59 y.o. [phone] postmenopausal female who presented via televisit for medication refill. She currently needs a refill on her HRT (Premarin tablets).  Notes she is doing well on the medication.  Also requests a refill on her cholesterol medication, Lovastatin.  Notes that her PCP was previously filling this medication but has been dismissed from the practice, and right now does not have a PCP.    Observations/Objective: Height 5\' 5"  (1.651 m), weight 196 lb (88.9 kg). Body mass index is 32.62 kg/m.  Vitals reported per patient.   Assessment and Plan:  Postmenopausal on HRT - patient desires to continue on HRT.  Refill given.  Dyslipidemia - Dyslipidemia currently on medication, requesting refill. Will give 3 month supply until patient is able to get established with a new PCP.  Establish care - referral place to get established with PCP.   Follow Up Instructions: Patient to f/u in 1 month for annual exam as previously scheduled.    I discussed the assessment and treatment plan with the patient. The patient was provided an opportunity to ask questions and all were answered. The patient agreed with the plan and demonstrated an understanding of the instructions.   The patient was advised to call back or seek an in-person evaluation if the symptoms worsen or if the condition fails to improve as anticipated.  I provided 7  minutes of non-face-to-face time during this encounter.    Rubie Maid, MD  [facility]

## 2020-01-26 NOTE — Pre-Procedure Instructions (Signed)
Patient was instructed to come in for labs and EKG as early as possible to insure we have all results to proceed with surgery.  She will call to say what day works for her.

## 2020-01-26 NOTE — Pre-Procedure Instructions (Signed)
Patient coming for labs EKG and COVID test tomorrow 3/17.  Left VM instructing patient to come through the medical mall and come to PAT for labs and EKG then get her COVID test.

## 2020-01-27 ENCOUNTER — Other Ambulatory Visit: Admission: RE | Admit: 2020-01-27 | Payer: Medicaid Other | Source: Ambulatory Visit

## 2020-01-27 ENCOUNTER — Inpatient Hospital Stay: Admission: RE | Admit: 2020-01-27 | Payer: Medicaid Other | Source: Ambulatory Visit

## 2020-01-27 ENCOUNTER — Telehealth: Payer: Self-pay | Admitting: Surgery

## 2020-01-27 NOTE — Telephone Encounter (Signed)
Patient is calling and is asking if she could r/s her surgery for Friday due to the patient not feeling well. Please call patient and advise.

## 2020-01-28 NOTE — Telephone Encounter (Signed)
Outbound call made to the pt & message left requesting a call back.    Per the pt's request, surgery has been postponed & awaiting a returned call to discuss scheduling preferences.  Thank you

## 2020-01-28 NOTE — Telephone Encounter (Signed)
Outbound call attempted & although the line sounded like it was picked up, there was no answer or response.    The call was made in an attempt to follow up on her request to reschedule surgery & see when she would like to reschedule.  Thank you

## 2020-01-29 ENCOUNTER — Ambulatory Visit: Admission: RE | Admit: 2020-01-29 | Payer: Medicaid Other | Source: Home / Self Care | Admitting: Surgery

## 2020-01-29 ENCOUNTER — Encounter: Admission: RE | Payer: Self-pay | Source: Home / Self Care

## 2020-01-29 SURGERY — REPAIR, HERNIA, VENTRAL
Anesthesia: General

## 2020-02-01 NOTE — Telephone Encounter (Signed)
Final outreach attempt made & message left requesting the pt call back (8:30a-5p).  When she does return the call, plz request her preferred time frame to have surgery scheduled, as it was cxl'd from 01/29/20, per her request.  Thank you

## 2020-02-24 ENCOUNTER — Encounter: Payer: Medicaid Other | Admitting: Obstetrics and Gynecology

## 2020-03-01 ENCOUNTER — Encounter: Payer: Self-pay | Admitting: Obstetrics and Gynecology

## 2020-03-01 ENCOUNTER — Ambulatory Visit (INDEPENDENT_AMBULATORY_CARE_PROVIDER_SITE_OTHER): Payer: Medicaid Other | Admitting: Obstetrics and Gynecology

## 2020-03-01 ENCOUNTER — Other Ambulatory Visit: Payer: Self-pay

## 2020-03-01 VITALS — BP 120/78 | HR 77 | Ht 65.0 in | Wt 200.8 lb

## 2020-03-01 DIAGNOSIS — Z0000 Encounter for general adult medical examination without abnormal findings: Secondary | ICD-10-CM

## 2020-03-01 DIAGNOSIS — Z79890 Hormone replacement therapy (postmenopausal): Secondary | ICD-10-CM | POA: Diagnosis not present

## 2020-03-01 DIAGNOSIS — Z01419 Encounter for gynecological examination (general) (routine) without abnormal findings: Secondary | ICD-10-CM

## 2020-03-01 DIAGNOSIS — K5900 Constipation, unspecified: Secondary | ICD-10-CM

## 2020-03-01 DIAGNOSIS — M546 Pain in thoracic spine: Secondary | ICD-10-CM | POA: Diagnosis not present

## 2020-03-01 DIAGNOSIS — Z7689 Persons encountering health services in other specified circumstances: Secondary | ICD-10-CM

## 2020-03-01 DIAGNOSIS — R3121 Asymptomatic microscopic hematuria: Secondary | ICD-10-CM | POA: Diagnosis not present

## 2020-03-01 DIAGNOSIS — Z1231 Encounter for screening mammogram for malignant neoplasm of breast: Secondary | ICD-10-CM

## 2020-03-01 DIAGNOSIS — R399 Unspecified symptoms and signs involving the genitourinary system: Secondary | ICD-10-CM

## 2020-03-01 DIAGNOSIS — G8929 Other chronic pain: Secondary | ICD-10-CM

## 2020-03-01 LAB — POCT URINALYSIS DIPSTICK
Bilirubin, UA: NEGATIVE
Glucose, UA: NEGATIVE
Ketones, UA: NEGATIVE
Leukocytes, UA: NEGATIVE
Nitrite, UA: NEGATIVE
Protein, UA: NEGATIVE
Spec Grav, UA: 1.02 (ref 1.010–1.025)
Urobilinogen, UA: 0.2 E.U./dL
pH, UA: 6.5 (ref 5.0–8.0)

## 2020-03-01 NOTE — Progress Notes (Signed)
Pt present for annual exam. Pt stated that she was doing well no problems.  

## 2020-03-01 NOTE — Progress Notes (Signed)
ANNUAL PREVENTATIVE CARE GYNECOLOGY  ENCOUNTER NOTE  Subjective:       Patricia Rollins is a 59 y.o. [id number] female here for a routine annual gynecologic exam. The patient is not sexually active. The patient is taking hormone replacement therapy. Patient denies post-menopausal vaginal bleeding. The patient wears seatbelts: yes. The patient participates in regular exercise: no.   Current complaints: 1.  Still noting some blood in the urine. Has been seen by Urology who did a workup with negative findings.  2. Needs to establish with a new PCP.   Gynecologic History No LMP recorded. Patient has had a hysterectomy. Contraception: none Last Pap: No longer needed.  S/p hysterectomy.  Last mammogram: 2013.  Patient was scheduled last year for mammogram but missed several appointments.  Last Colonoscopy: 05/08/2017.  Last Dexa Scan: Patient has never had one.    Obstetric History OB History  Gravida Para Term Preterm AB Living  3 3 3     3   SAB TAB Ectopic Multiple Live Births          3    # Outcome Date GA Lbr Len/2nd Weight Sex Delivery Anes PTL Lv  3 Term 14    F Vag-Spont   LIV  2 Term 84    M Vag-Spont   LIV  1 Term 1979    M Vag-Spont  N LIV    Past Medical History:  Diagnosis Date  . Anxiety   . Arthritis    patient has bilateral bursitis in hips  . CAD (coronary artery disease)   . Chronic pancreatitis ([place])    CT of Abd/Pelvis 12-06-17  . Colon polyp 05/08/2017   Dr Bary Castilla  . Depression   . Fibromyalgia   . GERD (gastroesophageal reflux disease)   . Hematuria 06/21/2016  . HLD (hyperlipidemia)   . IBS (irritable bowel syndrome)   . Neuromuscular disorder ([place])     Family History  Problem Relation Age of Onset  . Ovarian cancer Mother   . Pancreatic cancer Mother   . Bladder Cancer Neg Hx   . Kidney cancer Neg Hx   . Prostate cancer Neg Hx     Past Surgical History:  Procedure Laterality Date  . ABDOMINAL HYSTERECTOMY    . APPENDECTOMY    .  COLONOSCOPY WITH PROPOFOL N/A 05/08/2017   Procedure: COLONOSCOPY WITH PROPOFOL;  Surgeon: Robert Bellow, MD;  Location: [facility] ENDOSCOPY;  Service: Endoscopy;  Laterality: N/A;  . ESOPHAGOGASTRODUODENOSCOPY (EGD) WITH PROPOFOL N/A 05/08/2017   Procedure: ESOPHAGOGASTRODUODENOSCOPY (EGD) WITH PROPOFOL;  Surgeon: Robert Bellow, MD;  Location: [facility] ENDOSCOPY;  Service: Endoscopy;  Laterality: N/A;  . OOPHORECTOMY Right   . TONSILLECTOMY AND ADENOIDECTOMY Bilateral     Social History   Socioeconomic History  . Marital status: Single    Spouse name: Not on file  . Number of children: 3  . Years of education: Not on file  . Highest education level: Not on file  Occupational History  . Not on file  Tobacco Use  . Smoking status: Current Some Day Smoker    Packs/day: 0.25    Types: Cigarettes  . Smokeless tobacco: Never Used  Substance and Sexual Activity  . Alcohol use: No    Alcohol/week: 0.0 standard drinks  . Drug use: No  . Sexual activity: Never    Birth control/protection: Surgical  Other Topics Concern  . Not on file  Social History Narrative  . Not on file   Social Determinants  of Health   Financial Resource Strain:   . Difficulty of Paying Living Expenses:   Food Insecurity:   . Worried About Charity fundraiser in the Last Year:   . Arboriculturist in the Last Year:   Transportation Needs:   . Film/video editor (Medical):   Marland Kitchen Lack of Transportation (Non-Medical):   Physical Activity:   . Days of Exercise per Week:   . Minutes of Exercise per Session:   Stress:   . Feeling of Stress :   Social Connections:   . Frequency of Communication with Friends and Family:   . Frequency of Social Gatherings with Friends and Family:   . Attends Religious Services:   . Active Member of Clubs or Organizations:   . Attends Archivist Meetings:   Marland Kitchen Marital Status:   Intimate Partner Violence:   . Fear of Current or Ex-Partner:   . Emotionally Abused:     Marland Kitchen Physically Abused:   . Sexually Abused:     Current Outpatient Medications on File Prior to Visit  Medication Sig Dispense Refill  . ALPRAZolam (XANAX) 1 MG tablet Take 1 mg by mouth 3 (three) times daily.     Marland Kitchen aspirin EC 81 MG tablet Take 81 mg by mouth daily.     . Cholecalciferol (VITAMIN D3) 25 MCG (1000 UT) CAPS Take 1,000 Units by mouth daily.     . cimetidine (TAGAMET) 200 MG tablet Take 200 mg by mouth 2 (two) times daily.    Marland Kitchen estrogens, conjugated, (PREMARIN) 0.625 MG tablet Take 1 tablet (0.625 mg total) by mouth daily. 30 tablet 11  . gabapentin (NEURONTIN) 300 MG capsule Take 300 mg by mouth at bedtime.    Marland Kitchen HYDROcodone-acetaminophen (NORCO) 10-325 MG tablet Take 0.5 tablets by mouth 3 (three) times daily.    Marland Kitchen loratadine (CLARITIN) 10 MG tablet Take 10 mg by mouth daily.    Marland Kitchen lovastatin (MEVACOR) 20 MG tablet Take 1 tablet (20 mg total) by mouth at bedtime. 90 tablet 0  . PREMARIN vaginal cream INSERT 1 APPLICATORFUL VAGINALLY TWICE AWEEK (Patient taking differently: Place 1 Applicatorful vaginally 2 (two) times a week. ) 30 g 11  . tiZANidine (ZANAFLEX) 4 MG tablet Take 4 mg by mouth in the morning and at bedtime.      No current facility-administered medications on file prior to visit.    Allergies  Allergen Reactions  . Fluoxetine Other (See Comments)    Confusion   . Sulfa Antibiotics Nausea Only  . Sulfasalazine Nausea Only     Review of Systems ROS Review of Systems - General ROS: negative for - chills, fatigue, fever, hot flashes, night sweats, weight gain or weight loss.  Psychological ROS: negative for - anxiety, decreased libido, depression, mood swings, physical abuse or sexual abuse.  Ophthalmic ROS: negative for - blurry vision, eye pain or loss of vision ENT ROS: negative for - headaches, hearing change, visual changes or vocal changes Allergy and Immunology ROS: negative for - hives, itchy/watery eyes or seasonal allergies Hematological and  Lymphatic ROS: negative for - bleeding problems, bruising, swollen lymph nodes or weight loss Endocrine ROS: negative for - galactorrhea, hair pattern changes, hot flashes, malaise/lethargy, mood swings, palpitations, polydipsia/polyuria, skin changes, temperature intolerance or unexpected weight changes Breast ROS: negative for - new or changing breast lumps or nipple discharge Respiratory ROS: negative for - cough or shortness of breath Cardiovascular ROS: negative for - chest pain, irregular heartbeat, palpitations  or shortness of breath Gastrointestinal ROS: no abdominal pain,  or black or bloody stools. Positive for constipation (patient thinks it may be related to her back pain)  Genito-Urinary ROS: no dysuria, trouble voiding. Positive for hematuria Musculoskeletal ROS: negative for - Positive for back pain (chronic). Negative for joint stiffness Neurological ROS: negative for - bowel and bladder control changes Dermatological ROS: negative for rash and skin lesion changes   Objective:   BP 120/78   Pulse 77   Ht 5\' 5"  (1.651 m)   Wt 200 lb 12.8 oz (91.1 kg)   BMI 33.41 kg/m  CONSTITUTIONAL: Well-developed, well-nourished female in no acute distress.  PSYCHIATRIC: Normal mood and affect. Normal behavior. Normal judgment and thought content. [place]: Alert and oriented to person, place, and time. Normal muscle tone coordination. No cranial nerve deficit noted. HENT:  Normocephalic, atraumatic, External right and left ear normal. Oropharynx is clear and moist EYES: Conjunctivae and EOM are normal. Pupils are equal, round, and reactive to light. No scleral icterus.  NECK: Normal range of motion, supple, no masses.  Normal thyroid.  SKIN: Skin is warm and dry. No rash noted. Not diaphoretic. No erythema. No pallor. CARDIOVASCULAR: Normal heart rate noted, regular rhythm, no murmur. RESPIRATORY: Clear to auscultation bilaterally. Effort and breath sounds normal, no problems with  respiration noted. BREASTS: Symmetric in size. No masses, skin changes, nipple drainage, or lymphadenopathy. ABDOMEN: Soft, normal bowel sounds, no distention noted.  No tenderness, rebound or guarding.  BLADDER: Normal PELVIC:  Bladder no bladder distension noted  Urethra: normal appearing urethra with no masses, tenderness or lesions  Vulva: normal appearing vulva with no masses, tenderness or lesions  Vagina: normal appearing vagina with normal color (well estrogenized) and discharge, no lesions  Cervix: surgically absent  Uterus: surgically absent, vaginal cuff well healed  Adnexa: normal adnexa in size, nontender and no masses  RV: External Exam NormaI, No Rectal Masses and Normal Sphincter tone MUSCULOSKELETAL: Normal range of motion. No tenderness.  No cyanosis, clubbing, or edema.  2+ distal pulses. LYMPHATIC: No Axillary, Supraclavicular, or Inguinal Adenopathy.   Labs: Lab Results  Component Value Date   WBC 9.8 01/29/2017   HGB 14.1 01/29/2017   HCT 41.1 01/29/2017   MCV 92.1 01/29/2017   PLT 353 01/29/2017    Lab Results  Component Value Date   CREATININE 0.54 01/29/2017   BUN <5 (L) 01/29/2017   NA 138 01/29/2017   K 2.8 (L) 01/29/2017   CL 103 01/29/2017   CO2 27 01/29/2017    Lab Results  Component Value Date   ALT 12 (L) 01/29/2017   AST 24 01/29/2017   ALKPHOS 74 01/29/2017   BILITOT 0.4 01/29/2017    Lab Results  Component Value Date   CHOL 205 (H) 07/18/2016   HDL 54 07/18/2016   LDLCALC 122 07/18/2016   TRIG 143 07/18/2016   CHOLHDL 3.8 07/18/2016    Lab Results  Component Value Date   TSH 0.70 07/18/2016    No results found for: HGBA1C  Results for orders placed or performed in visit on 03/01/20  POCT urinalysis dipstick  Result Value Ref Range   Color, UA yellow    Clarity, UA clear    Glucose, UA Negative Negative   Bilirubin, UA neg    Ketones, UA neg    Spec Grav, UA 1.020 1.010 - 1.025   Blood, UA 3+    pH, UA 6.5 5.0 -  8.0   Protein, UA Negative Negative  Urobilinogen, UA 0.2 0.2 or 1.0 E.U./dL   Nitrite, UA neg    Leukocytes, UA Negative Negative   Appearance yellow;clear    Odor      Assessment:   1. Encounter for well woman exam with routine gynecological exam   2. Asymptomatic microscopic hematuria   3. Chronic midline thoracic back pain   4. Constipation, unspecified constipation type   5. Post-menopause on HRT (hormone replacement therapy)   6. Breast cancer screening by mammogram   7. UTI symptoms   8. Encounter to establish care with new doctor     Plan:  - Pap: Not needed. Is s/p hysterectomy. - Mammogram: Ordered. Unable to perform at [facility] due to excessive cancellations/missed appointments. Will refer to [place].  - Stool Guaiac Testing:  Not Indicated. Up to date with colonoscopy.  - Labs: TSH ordered as patient complains of neck nodule.  Advised that area was most likely a fat pad and was not near her thyroid, still desires to have lab checked.  - Routine preventative health maintenance measures emphasized: Exercise/Diet/Weight control, Tobacco Warnings, Alcohol/Substance use risks, Stress Management and Peer Pressure Issues - Patient desires to establish a new PCP. States that she was dismissed from her previous practice. Will place new referral.  - UA performed today for complaints of hematuria.  - Back pain (chronic). Patient currently on chronic pain medications. Wonders if she should consider seeing a chiropractor or Orthopedist. Advised that both may be beneficial. Given handout on chiropractor options.  Notes there is a walk-in Orthopedic clinic near her home that she also may try.  - Constipation noted mostly with back pain flairs. Has been trying stool softeners and laxatives when it occurs.   Return to [place], MD  [facility]

## 2020-03-01 NOTE — Patient Instructions (Signed)
Preventive Care 40-59 Years Old, Female Preventive care refers to visits with your health care provider and lifestyle choices that can promote health and wellness. This includes:  A yearly physical exam. This may also be called an annual well check.  Regular dental visits and eye exams.  Immunizations.  Screening for certain conditions.  Healthy lifestyle choices, such as eating a healthy diet, getting regular exercise, not using drugs or products that contain nicotine and tobacco, and limiting alcohol use. What can I expect for my preventive care visit? Physical exam Your health care provider will check your:  Height and weight. This may be used to calculate body mass index (BMI), which tells if you are at a healthy weight.  Heart rate and blood pressure.  Skin for abnormal spots. Counseling Your health care provider may ask you questions about your:  Alcohol, tobacco, and drug use.  Emotional well-being.  Home and relationship well-being.  Sexual activity.  Eating habits.  Work and work environment.  Method of birth control.  Menstrual cycle.  Pregnancy history. What immunizations do I need?  Influenza (flu) vaccine  This is recommended every year. Tetanus, diphtheria, and pertussis (Tdap) vaccine  You may need a Td booster every 10 years. Varicella (chickenpox) vaccine  You may need this if you have not been vaccinated. Zoster (shingles) vaccine  You may need this after age 60. Measles, mumps, and rubella (MMR) vaccine  You may need at least one dose of MMR if you were born in 1957 or later. You may also need a second dose. Pneumococcal conjugate (PCV13) vaccine  You may need this if you have certain conditions and were not previously vaccinated. Pneumococcal polysaccharide (PPSV23) vaccine  You may need one or two doses if you smoke cigarettes or if you have certain conditions. Meningococcal conjugate (MenACWY) vaccine  You may need this if you  have certain conditions. Hepatitis A vaccine  You may need this if you have certain conditions or if you travel or work in places where you may be exposed to hepatitis A. Hepatitis B vaccine  You may need this if you have certain conditions or if you travel or work in places where you may be exposed to hepatitis B. Haemophilus influenzae type b (Hib) vaccine  You may need this if you have certain conditions. Human papillomavirus (HPV) vaccine  If recommended by your health care provider, you may need three doses over 6 months. You may receive vaccines as individual doses or as more than one vaccine together in one shot (combination vaccines). Talk with your health care provider about the risks and benefits of combination vaccines. What tests do I need? Blood tests  Lipid and cholesterol levels. These may be checked every 5 years, or more frequently if you are over 50 years old.  Hepatitis C test.  Hepatitis B test. Screening  Lung cancer screening. You may have this screening every year starting at age 55 if you have a 30-pack-year history of smoking and currently smoke or have quit within the past 15 years.  Colorectal cancer screening. All adults should have this screening starting at age 50 and continuing until age 75. Your health care provider may recommend screening at age 45 if you are at increased risk. You will have tests every 1-10 years, depending on your results and the type of screening test.  Diabetes screening. This is done by checking your blood sugar (glucose) after you have not eaten for a while (fasting). You may have this   done every 1-3 years.  Mammogram. This may be done every 1-2 years. Talk with your health care provider about when you should start having regular mammograms. This may depend on whether you have a family history of breast cancer.  BRCA-related cancer screening. This may be done if you have a family history of breast, ovarian, tubal, or peritoneal  cancers.  Pelvic exam and Pap test. This may be done every 3 years starting at age 60. Starting at age 7, this may be done every 5 years if you have a Pap test in combination with an HPV test. Other tests  Sexually transmitted disease (STD) testing.  Bone density scan. This is done to screen for osteoporosis. You may have this scan if you are at high risk for osteoporosis. Follow these instructions at home: Eating and drinking  Eat a diet that includes fresh fruits and vegetables, whole grains, lean protein, and low-fat dairy.  Take vitamin and mineral supplements as recommended by your health care provider.  Do not drink alcohol if: ? Your health care provider tells you not to drink. ? You are pregnant, may be pregnant, or are planning to become pregnant.  If you drink alcohol: ? Limit how much you have to 0-1 drink a day. ? Be aware of how much alcohol is in your drink. In the [place] one drink equals one 12 oz bottle of beer (355 mL), one 5 oz glass of wine (148 mL), or one 1 oz glass of hard liquor (44 mL). Lifestyle  Take daily care of your teeth and gums.  Stay active. Exercise for at least 30 minutes on 5 or more days each week.  Do not use any products that contain nicotine or tobacco, such as cigarettes, e-cigarettes, and chewing tobacco. If you need help quitting, ask your health care provider.  If you are sexually active, practice safe sex. Use a condom or other form of birth control (contraception) in order to prevent pregnancy and STIs (sexually transmitted infections).  If told by your health care provider, take low-dose aspirin daily starting at age 48. What's next?  Visit your health care provider once a year for a well check visit.  Ask your health care provider how often you should have your eyes and teeth checked.  Stay up to date on all vaccines. This information is not intended to replace advice given to you by your health care provider. Make sure you  discuss any questions you have with your health care provider. Document Revised: 07/10/2018 Document Reviewed: 07/10/2018 Elsevier Patient Education  2020 [place] Breast self-awareness is knowing how your breasts look and feel. Doing breast self-awareness is important. It allows you to catch a breast problem early while it is still small and can be treated. All women should do breast self-awareness, including women who have had breast implants. Tell your doctor if you notice a change in your breasts. What you need:  A mirror.  A well-lit room. How to do a breast self-exam A breast self-exam is one way to learn what is normal for your breasts and to check for changes. To do a breast self-exam: Look for changes  1. Take off all the clothes above your waist. 2. Stand in front of a mirror in a room with good lighting. 3. Put your hands on your hips. 4. Push your hands down. 5. Look at your breasts and nipples in the mirror to see if one breast or nipple looks different from the  other. Check to see if: ? The shape of one breast is different. ? The size of one breast is different. ? There are wrinkles, dips, and bumps in one breast and not the other. 6. Look at each breast for changes in the skin, such as: ? Redness. ? Scaly areas. 7. Look for changes in your nipples, such as: ? Liquid around the nipples. ? Bleeding. ? Dimpling. ? Redness. ? A change in where the nipples are. Feel for changes  1. Lie on your back on the floor. 2. Feel each breast. To do this, follow these steps: ? Pick a breast to feel. ? Put the arm closest to that breast above your head. ? Use your other arm to feel the nipple area of your breast. Feel the area with the pads of your three middle fingers by making small circles with your fingers. For the first circle, press lightly. For the second circle, press harder. For the third circle, press even harder. ? Keep making circles with  your fingers at the different pressures as you move down your breast. Stop when you feel your ribs. ? Move your fingers a little toward the center of your body. ? Start making circles with your fingers again, this time going up until you reach your collarbone. ? Keep making up-and-down circles until you reach your armpit. Remember to keep using the three pressures. ? Feel the other breast in the same way. 3. Sit or stand in the tub or shower. 4. With soapy water on your skin, feel each breast the same way you did in step 2 when you were lying on the floor. Write down what you find Writing down what you find can help you remember what to tell your doctor. Write down:  What is normal for each breast.  Any changes you find in each breast, including: ? The kind of changes you find. ? Whether you have pain. ? Size and location of any lumps.  When you last had your menstrual period. General tips  Check your breasts every month.  If you are breastfeeding, the best time to check your breasts is after you feed your baby or after you use a breast pump.  If you get menstrual periods, the best time to check your breasts is 5-7 days after your menstrual period is over.  With time, you will become comfortable with the self-exam, and you will begin to know if there are changes in your breasts. Contact a doctor if you:  See a change in the shape or size of your breasts or nipples.  See a change in the skin of your breast or nipples, such as red or scaly skin.  Have fluid coming from your nipples that is not normal.  Find a lump or thick area that was not there before.  Have pain in your breasts.  Have any concerns about your breast health. Summary  Breast self-awareness includes looking for changes in your breasts, as well as feeling for changes within your breasts.  Breast self-awareness should be done in front of a mirror in a well-lit room.  You should check your breasts every month.  If you get menstrual periods, the best time to check your breasts is 5-7 days after your menstrual period is over.  Let your doctor know of any changes you see in your breasts, including changes in size, changes on the skin, pain or tenderness, or fluid from your nipples that is not normal. This information is not  intended to replace advice given to you by your health care provider. Make sure you discuss any questions you have with your health care provider. Document Revised: 06/17/2018 Document Reviewed: 06/17/2018 Elsevier Patient Education  [place].

## 2020-03-02 LAB — THYROID PANEL WITH TSH
Free Thyroxine Index: 1.4 (ref 1.2–4.9)
T3 Uptake Ratio: 20 % — ABNORMAL LOW (ref 24–39)
T4, Total: 6.8 ug/dL (ref 4.5–12.0)
TSH: 1.33 u[IU]/mL (ref 0.450–4.500)

## 2020-03-03 ENCOUNTER — Encounter: Payer: Self-pay | Admitting: Obstetrics and Gynecology

## 2020-03-08 ENCOUNTER — Telehealth: Payer: Self-pay | Admitting: Obstetrics and Gynecology

## 2020-03-08 NOTE — Telephone Encounter (Signed)
Patient called requesting of Thyroid labs.

## 2020-03-08 NOTE — Telephone Encounter (Signed)
Pt called in and stated that she has been waiting for a call on her labs. The pt is requesting a call please advise.

## 2020-03-09 ENCOUNTER — Telehealth: Payer: Self-pay | Admitting: Obstetrics and Gynecology

## 2020-03-09 NOTE — Telephone Encounter (Signed)
Please see another phone encounter.  

## 2020-03-09 NOTE — Telephone Encounter (Signed)
Pt is aware of test results. Pt requested for a copy of her test results be mailed to her. Test results, printed and mailed to pt.

## 2020-03-09 NOTE — Telephone Encounter (Signed)
Patient called in again to get her lab results. Could you please advise?

## 2020-04-20 NOTE — Telephone Encounter (Signed)
Error

## 2020-05-23 ENCOUNTER — Telehealth: Payer: Self-pay | Admitting: Surgery

## 2020-05-23 NOTE — Telephone Encounter (Signed)
Outbound call is made once again, left message for patient to call.  Just want to see if she is still interested in proceeding with surgery.  At this point, if patient decides to proceed, will need to bring in for office visit again with the doctor prior to scheduling.

## 2020-06-14 ENCOUNTER — Telehealth: Payer: Self-pay | Admitting: Obstetrics and Gynecology

## 2020-06-14 ENCOUNTER — Other Ambulatory Visit: Payer: Self-pay | Admitting: Obstetrics and Gynecology

## 2020-06-14 NOTE — Telephone Encounter (Signed)
Pt called in and stated that she wanted to talk to Dr. Marcelline Mates or her nurse quickly. I told her that she was with pts. The pt then stated  that her pharmacy sent a refill request on the wrong medication. The pt is requesting a refill on trulance sent to the [facility] on [address]. The pt is requesting a call back. Please advise

## 2020-06-15 NOTE — Telephone Encounter (Signed)
Please advise. Thanks Aolanis Crispen 

## 2020-06-15 NOTE — Telephone Encounter (Signed)
Called pt to speak to her concerning a medication refill. Pt stated that she is currently not seeing Dr. Lavena Bullion and needs a refill of her medication Trulance 3 mg. Pt was advised that I did not see where she has been taking the medication before. Pt stated that another provider prescribed her the medication. Pt is requested a refill as soon as possible.

## 2020-06-16 NOTE — Telephone Encounter (Signed)
Patient is calling in again regarding her medication refill. She asked if it would be able to be refilled. Informed patient that a previous message regarding the same thing had already been sent in and routed to her provider and that we typically allow 24-48 hours for the providers to review messages sent to them. Patient was very curt and irritable.

## 2020-06-16 NOTE — Telephone Encounter (Signed)
Please tell patient that I am not familiar with the Trulance medication and have never prescribed it before to patients.  I have in the past prescribed the Linzess which I am familiar with.  We can try to contact Talmage lindley's office to get records as they are not on Epic but I don't feel comfortable prescribing her a medication that I have never used for my patient.s

## 2020-06-17 ENCOUNTER — Telehealth: Payer: Self-pay

## 2020-06-17 ENCOUNTER — Other Ambulatory Visit: Payer: Self-pay

## 2020-06-17 DIAGNOSIS — Z7689 Persons encountering health services in other specified circumstances: Secondary | ICD-10-CM

## 2020-06-17 MED ORDER — LINACLOTIDE 290 MCG PO CAPS: 290.0000 ug | ORAL_CAPSULE | Freq: Every day | ORAL | 0 refills | Status: DC

## 2020-06-17 NOTE — Telephone Encounter (Signed)
Pt states she sent a message in several days ago requesting a rx for LInzess.   Pt states [facility] gave her a sample and she would like a refill. Previous note mentions pt wanting Trulance and [facility] not prescribing   as she is not familiar with that drug. She is familiar with Linzess.   Pt aware Linzess rxed.  #30 only. Will send message to [facility] to see if pt needs to see PCP for constipation or we can continue to refill.   Pt voiced understanding.

## 2020-06-17 NOTE — Telephone Encounter (Signed)
Please see CM's message.

## 2020-06-20 NOTE — Telephone Encounter (Signed)
Unfortunately she does not have a PCP at this time. She was dismissed. I sent in a referral some time back for her to establish with a new PCP but not sure what happened with this.

## 2020-06-20 NOTE — Telephone Encounter (Signed)
Referral placed in 03/2020 to [place] contacted pt to schedule.  Pt states that is too far.   Referral place to [facility] per pt request. Pt aware PCP can manage constipation.

## 2020-08-09 ENCOUNTER — Telehealth: Payer: Self-pay

## 2020-08-09 NOTE — Telephone Encounter (Signed)
Attempted to call patient, No answer. Left message to confirm appt and to bring H&P with her and list of medications.

## 2020-08-10 ENCOUNTER — Ambulatory Visit: Payer: Medicaid Other | Admitting: Student in an Organized Health Care Education/Training Program

## 2020-09-12 NOTE — Progress Notes (Deleted)
Cardiology Office Note  Date:  09/12/2020   ID:  Patricia Rollins, DOB 04/28/1961, MRN [id number]  PCP:  Remi Haggard, FNP   No chief complaint on file.   HPI:  Miss Patricia Rollins is a pleasant 59 year old woman with  long history of smoking who continues to smoke one pack per day,  In nic inhaler COPD, CAD  PAD noted on CT scan of the chest and abdomen,  hyperlipidemia,  who for follow-up of her coronary disease and peripheral arterial disease.  In follow-up she reports that she continues to have Stomach pain, She has completed Lots of workup, numerous imaging studies, seen GI No clear etiology of her discomfort Reports that his lower abdomen Pain often relieved with bowel movement Often has chronic upper back pain when she has lower bowel movement pain Back pain resolved after bowel movement When "spine locks up", bowel blocks up Pressure between shoulder blades after eating  "better with gas/belching"  Tried protonix, and pepcid Had to stop the medication as she was having headaches  Denies any significant chest pain on exertion Was told by primary care that cholesterol was elevated on lovastatin 20 Was told to increase up to 40 mg daily, she has not done this yet   continues to smoke, She reports that she started smoking age 23  Tried chantix, had vivid dreams,  No desire to quit at this time  EKG personally reviewed by myself on todays visit Shows normal sinus rhythm rate 72 bpm no significant ST or T-wave changes  Other past medical history reviewed CT scan chest and abdomen/pelvis performed in September and again in October 2017  CT scan  results discussed with her again today   mild to moderate coronary calcification in the mid LAD, proximal RCA Also has mild to moderate aortic arch calcification, moderate distal descending aorta disease, mild to moderate common iliac and bilateral femoral arterial disease    PMH:   has a past medical history of Anxiety,  Arthritis, CAD (coronary artery disease), Chronic pancreatitis ([place]), Colon polyp (05/08/2017), Depression, Fibromyalgia, GERD (gastroesophageal reflux disease), Hematuria (06/21/2016), HLD (hyperlipidemia), IBS (irritable bowel syndrome), and Neuromuscular disorder ([place]).  PSH:    Past Surgical History:  Procedure Laterality Date  . ABDOMINAL HYSTERECTOMY    . APPENDECTOMY    . COLONOSCOPY WITH PROPOFOL N/A 05/08/2017   Procedure: COLONOSCOPY WITH PROPOFOL;  Surgeon: Robert Bellow, MD;  Location: [facility] ENDOSCOPY;  Service: Endoscopy;  Laterality: N/A;  . ESOPHAGOGASTRODUODENOSCOPY (EGD) WITH PROPOFOL N/A 05/08/2017   Procedure: ESOPHAGOGASTRODUODENOSCOPY (EGD) WITH PROPOFOL;  Surgeon: Robert Bellow, MD;  Location: [facility] ENDOSCOPY;  Service: Endoscopy;  Laterality: N/A;  . OOPHORECTOMY Right   . TONSILLECTOMY AND ADENOIDECTOMY Bilateral     Current Outpatient Medications  Medication Sig Dispense Refill  . ALPRAZolam (XANAX) 1 MG tablet Take 1 mg by mouth 3 (three) times daily.     Marland Kitchen aspirin EC 81 MG tablet Take 81 mg by mouth daily.     . Cholecalciferol (VITAMIN D3) 25 MCG (1000 UT) CAPS Take 1,000 Units by mouth daily.     . cimetidine (TAGAMET) 200 MG tablet Take 200 mg by mouth 2 (two) times daily.    Marland Kitchen estrogens, conjugated, (PREMARIN) 0.625 MG tablet Take 1 tablet (0.625 mg total) by mouth daily. 30 tablet 11  . gabapentin (NEURONTIN) 300 MG capsule Take 300 mg by mouth at bedtime.    Marland Kitchen HYDROcodone-acetaminophen (NORCO) 10-325 MG tablet Take 0.5 tablets by mouth 3 (three) times  daily.    . linaclotide (LINZESS) 290 MCG CAPS capsule Take 1 capsule (290 mcg total) by mouth daily before breakfast. 30 capsule 0  . loratadine (CLARITIN) 10 MG tablet Take 10 mg by mouth daily.    Marland Kitchen lovastatin (MEVACOR) 20 MG tablet Take 1 tablet (20 mg total) by mouth at bedtime. 90 tablet 0  . PREMARIN vaginal cream INSERT 1 APPLICATORFUL VAGINALLY TWICE AWEEK (Patient taking differently: Place 1  Applicatorful vaginally 2 (two) times a week. ) 30 g 11  . tiZANidine (ZANAFLEX) 4 MG tablet Take 4 mg by mouth in the morning and at bedtime.      No current facility-administered medications for this visit.     Allergies:   Fluoxetine, Sulfa antibiotics, and Sulfasalazine   Social History:  The patient  reports that she has been smoking cigarettes. She has been smoking about 0.25 packs per day. She has never used smokeless tobacco. She reports that she does not drink alcohol and does not use drugs.   Family History:   family history includes Ovarian cancer in her mother; Pancreatic cancer in her mother.    Review of Systems: Review of Systems  Constitutional: Negative.   Respiratory: Negative.   Cardiovascular: Negative.   Gastrointestinal: Positive for abdominal pain.       Bloating  Musculoskeletal: Positive for back pain.  Neurological: Negative.   Psychiatric/Behavioral: Negative.   All other systems reviewed and are negative.    PHYSICAL EXAM: VS:  There were no vitals taken for this visit. , BMI There is no height or weight on file to calculate BMI. Constitutional:  oriented to person, place, and time. No distress.  HENT:  Head: Normocephalic and atraumatic.  Eyes:  no discharge. No scleral icterus.  Neck: Normal range of motion. Neck supple. No JVD present.  Cardiovascular: Normal rate, regular rhythm, normal heart sounds and intact distal pulses. Exam reveals no gallop and no friction rub. No edema No murmur heard. Pulmonary/Chest: Effort normal and breath sounds normal. No stridor. No respiratory distress.  no wheezes.  no rales.  no tenderness.  Abdominal: Soft.  no distension.  no tenderness.  Musculoskeletal: Normal range of motion.  no  tenderness or deformity.  Neurological:  normal muscle tone. Coordination normal. No atrophy Skin: Skin is warm and dry. No rash noted. not diaphoretic.  Psychiatric:  normal mood and affect. behavior is normal. Thought content  normal.   Recent Labs: 03/01/2020: TSH 1.330    Lipid Panel Lab Results  Component Value Date   CHOL 205 (H) 07/18/2016   HDL 54 07/18/2016   LDLCALC 122 07/18/2016   TRIG 143 07/18/2016      Wt Readings from Last 3 Encounters:  03/01/20 200 lb 12.8 oz (91.1 kg)  01/20/20 196 lb (88.9 kg)  01/19/20 196 lb 6.9 oz (89.1 kg)       ASSESSMENT AND PLAN:  Smoker Long discussion concerning smoking cessation She does not want Chantix or other modalities Does not seem to have any desire to quit smoking right now  Coronary artery disease involving coronary bypass graft of native heart without angina pectoris Currently with no symptoms of angina. No further workup at this time. Continue current medication regimen.stable Recommended smoking cessation  Aortic atherosclerosis (HCC)  mild to moderate in nature,  Suggested she increase lovastatin to 40 mg daily and check her cholesterol in 3 months time Order for lipids provided for 3 months  PAD (peripheral artery disease) (HCC) No claudication symptoms Mild  to moderate disease of the common iliac, femoral arteries bilaterally, distal descending aorta Recommended smoking cessation   Pure hypercholesterolemia Lovastatin up to 40 mg daily   Total encounter time more than 25 minutes  Greater than 50% was spent in counseling and coordination of care with the patient   Disposition:   F/U  As needed   No orders of the defined types were placed in this encounter.    Signed, Esmond Plants, M.D., Ph.D. 09/12/2020  [place], [place]

## 2020-09-13 ENCOUNTER — Ambulatory Visit: Payer: Medicaid Other | Admitting: Cardiovascular Disease

## 2020-09-13 DIAGNOSIS — N280 Ischemia and infarction of kidney: Secondary | ICD-10-CM

## 2020-09-13 DIAGNOSIS — E7800 Pure hypercholesterolemia, unspecified: Secondary | ICD-10-CM

## 2020-09-13 DIAGNOSIS — I25810 Atherosclerosis of coronary artery bypass graft(s) without angina pectoris: Secondary | ICD-10-CM

## 2020-09-13 DIAGNOSIS — I700 Atherosclerosis of aorta: Secondary | ICD-10-CM

## 2020-09-13 DIAGNOSIS — I739 Peripheral vascular disease, unspecified: Secondary | ICD-10-CM

## 2020-09-27 ENCOUNTER — Ambulatory Visit (INDEPENDENT_AMBULATORY_CARE_PROVIDER_SITE_OTHER): Payer: Medicaid Other | Admitting: Adult Health

## 2020-09-27 DIAGNOSIS — Z5329 Procedure and treatment not carried out because of patient's decision for other reasons: Secondary | ICD-10-CM | POA: Insufficient documentation

## 2020-09-27 DIAGNOSIS — Z91199 Patient's noncompliance with other medical treatment and regimen due to unspecified reason: Secondary | ICD-10-CM | POA: Insufficient documentation

## 2020-09-27 NOTE — Progress Notes (Signed)
   Complete physical exam  Patient: Patricia Rollins   DOB: 09/01/1999   59 y.o. Female  MRN: [id number]  Subjective:    No chief complaint on file.   Patricia Rollins is a 59 y.o. female who presents today for a complete physical exam. She reports consuming a {diet types:17450} diet. {types:19826} She generally feels {DESC; WELL/FAIRLY WELL/POORLY:18703}. She reports sleeping {DESC; WELL/FAIRLY WELL/POORLY:18703}. She {does/does not:200015} have additional problems to discuss today.    Most recent fall risk assessment:    05/09/2022   10:42 AM  Fall Risk   Falls in the past year? 0  Number falls in past yr: 0  Injury with Fall? 0  Risk for fall due to : No Fall Risks  Follow up Falls evaluation completed     Most recent depression screenings:    05/09/2022   10:42 AM 03/30/2021   10:46 AM  PHQ 2/9 Scores  PHQ - 2 Score 0 0  PHQ- 9 Score 5     {VISON DENTAL STD PSA (Optional):27386}  {History (Optional):23778}  Patient Care Team: Jessup, Joy, NP as PCP - General (Nurse Practitioner)   Outpatient Medications Prior to Visit  Medication Sig   fluticasone (FLONASE) 50 MCG/ACT nasal spray Place 2 sprays into both nostrils in the morning and at bedtime. After 7 days, reduce to once daily.   norgestimate-ethinyl estradiol (SPRINTEC 28) 0.25-35 MG-MCG tablet Take 1 tablet by mouth daily.   Nystatin POWD Apply liberally to affected area 2 times per day   spironolactone (ALDACTONE) 100 MG tablet Take 1 tablet (100 mg total) by mouth daily.   No facility-administered medications prior to visit.    ROS        Objective:     There were no vitals taken for this visit. {Vitals History (Optional):23777}  Physical Exam   No results found for any visits on 06/14/22. {Show previous labs (optional):23779}    Assessment & Plan:    Routine Health Maintenance and Physical Exam  Immunization History  Administered Date(s) Administered   DTaP 11/15/1999, 01/11/2000,  03/21/2000, 12/05/2000, 06/20/2004   Hepatitis A 04/16/2008, 04/22/2009   Hepatitis B 09/02/1999, 10/10/1999, 03/21/2000   HiB (PRP-OMP) 11/15/1999, 01/11/2000, 03/21/2000, 12/05/2000   IPV 11/15/1999, 01/11/2000, 09/09/2000, 06/20/2004   Influenza,inj,Quad PF,6+ Mos 07/23/2014   Influenza-Unspecified 10/22/2012   MMR 09/09/2001, 06/20/2004   Meningococcal Polysaccharide 04/21/2012   Pneumococcal Conjugate-13 12/05/2000   Pneumococcal-Unspecified 03/21/2000, 06/04/2000   Tdap 04/21/2012   Varicella 09/09/2000, 04/16/2008    Health Maintenance  Topic Date Due   HIV Screening  Never done   Hepatitis C Screening  Never done   INFLUENZA VACCINE  06/12/2022   PAP-Cervical Cytology Screening  06/14/2022 (Originally 08/31/2020)   PAP SMEAR-Modifier  06/14/2022 (Originally 08/31/2020)   TETANUS/TDAP  06/14/2022 (Originally 04/21/2022)   HPV VACCINES  Discontinued   COVID-19 Vaccine  Discontinued    Discussed health benefits of physical activity, and encouraged her to engage in regular exercise appropriate for her age and condition.  Problem List Items Addressed This Visit   None Visit Diagnoses     Annual physical exam    -  Primary   Cervical cancer screening       Need for Tdap vaccination          No follow-ups on file.     Joy Jessup, NP   

## 2020-10-18 ENCOUNTER — Ambulatory Visit (INDEPENDENT_AMBULATORY_CARE_PROVIDER_SITE_OTHER): Payer: Medicaid Other | Admitting: Adult Health

## 2020-10-18 DIAGNOSIS — Z5329 Procedure and treatment not carried out because of patient's decision for other reasons: Secondary | ICD-10-CM

## 2020-10-18 NOTE — Progress Notes (Signed)
No show for appointment - new patient 10/18/20.

## 2020-10-19 NOTE — Progress Notes (Signed)
Cardiology Office Note  Date:  10/24/2020   ID:  Patricia Rollins, Patricia Rollins July 16, 1961, MRN [id number]  PCP:  Patricia Haggard, FNP   Chief Complaint  Patient presents with   Follow-up    No new cardiac concerns; Meds verbally reviewed with patient.    HPI:  Patricia Rollins is a pleasant 59 year old woman with  long history of smoking who continues to smoke one pack per day,  In nic inhaler COPD, CAD  PAD noted on CT scan of the chest and abdomen,  hyperlipidemia,  who for follow-up of her coronary disease and peripheral arterial disease.  Numerous issues to discuss today Right wrist swelling, bruising Images reviewed from her smart phone  When she wakes up the morning reports her left arm is numb, difficulty getting it moving in the morning "cant get arm to work", after a while she is able to then move it better  Had back injection, right side back Felt worse since then, more of a general malaise  "8-9 doctors" Tired of doctors  Continues to have GI issues Stomach pain, pain relieved with bowel movement, Belching Pressure between shoulder blades when eating sometimes  Not on PPI  Previously recommended to increase her lovastatin up to 40, she continues to take 20  No recent lab work  EKG personally reviewed by myself on todays visit Normal sinus rhythm rate 78 bpm nonspecific T wave abnormality   continues to smoke, She reports that she started smoking age 68  Tried chantix, had vivid dreams,    Other past medical history reviewed CT scan chest and abdomen/pelvis performed in September and again in October 2017  CT scan  results discussed with her again today   mild to moderate coronary calcification in the mid LAD, proximal RCA Also has mild to moderate aortic arch calcification, moderate distal descending aorta disease, mild to moderate common iliac and bilateral femoral arterial disease   PMH:   has a past medical history of Anxiety, Arthritis, CAD (coronary  artery disease), Chronic pancreatitis ([place]), Colon polyp (05/08/2017), Depression, Fibromyalgia, GERD (gastroesophageal reflux disease), Hematuria (06/21/2016), HLD (hyperlipidemia), IBS (irritable bowel syndrome), and Neuromuscular disorder ([place]).  PSH:    Past Surgical History:  Procedure Laterality Date   ABDOMINAL HYSTERECTOMY     APPENDECTOMY     COLONOSCOPY WITH PROPOFOL N/A 05/08/2017   Procedure: COLONOSCOPY WITH PROPOFOL;  Surgeon: Patricia Bellow, MD;  Location: [facility] ENDOSCOPY;  Service: Endoscopy;  Laterality: N/A;   ESOPHAGOGASTRODUODENOSCOPY (EGD) WITH PROPOFOL N/A 05/08/2017   Procedure: ESOPHAGOGASTRODUODENOSCOPY (EGD) WITH PROPOFOL;  Surgeon: Patricia Bellow, MD;  Location: [facility] ENDOSCOPY;  Service: Endoscopy;  Laterality: N/A;   OOPHORECTOMY Right    TONSILLECTOMY AND ADENOIDECTOMY Bilateral     Current Outpatient Medications  Medication Sig Dispense Refill   ALPRAZolam (XANAX) 1 MG tablet Take 1 mg by mouth 3 (three) times daily.      aspirin EC 81 MG tablet Take 81 mg by mouth daily.      Cholecalciferol (VITAMIN D3) 25 MCG (1000 UT) CAPS Take 1,000 Units by mouth daily.      cimetidine (TAGAMET) 200 MG tablet Take 200 mg by mouth 2 (two) times daily.     estrogens, conjugated, (PREMARIN) 0.625 MG tablet Take 1 tablet (0.625 mg total) by mouth daily. 30 tablet 11   gabapentin (NEURONTIN) 300 MG capsule Take 300 mg by mouth at bedtime.     HYDROcodone-acetaminophen (NORCO) 10-325 MG tablet Take 0.5 tablets by mouth 3 (three) times  daily.     linaclotide (LINZESS) 290 MCG CAPS capsule Take 1 capsule (290 mcg total) by mouth daily before breakfast. 30 capsule 0   loratadine (CLARITIN) 10 MG tablet Take 10 mg by mouth daily.     lovastatin (MEVACOR) 20 MG tablet Take 1 tablet (20 mg total) by mouth at bedtime. 90 tablet 0   oxyCODONE ER (XTAMPZA ER) 9 MG C12A Take by mouth every 8 (eight) hours as needed.     PREMARIN vaginal cream INSERT 1  APPLICATORFUL VAGINALLY TWICE AWEEK 30 g 11   tiZANidine (ZANAFLEX) 4 MG tablet Take 4 mg by mouth in the morning and at bedtime.     No current facility-administered medications for this visit.     Allergies:   Fluoxetine, Sulfa antibiotics, and Sulfasalazine   Social History:  The patient  reports that she has been smoking cigarettes. She has been smoking about 0.25 packs per day. She has never used smokeless tobacco. She reports that she does not drink alcohol and does not use drugs.   Family History:   family history includes Ovarian cancer in her mother; Pancreatic cancer in her mother.    Review of Systems: Review of Systems  Constitutional: Negative.   HENT: Negative.   Respiratory: Negative.   Cardiovascular: Negative.   Gastrointestinal: Negative.   Musculoskeletal: Positive for back pain.  Neurological: Negative.   Psychiatric/Behavioral: Negative.   All other systems reviewed and are negative.   PHYSICAL EXAM: VS:  BP 130/90 (BP Location: Left Arm, Patient Position: Sitting, Cuff Size: Large)    Pulse 78    Ht 5\' 5"  (1.651 m)    Wt 201 lb (91.2 kg)    SpO2 98%    BMI 33.45 kg/m  , BMI Body mass index is 33.45 kg/m. Constitutional:  oriented to person, place, and time. No distress.  HENT:  Head: Grossly normal Eyes:  no discharge. No scleral icterus.  Neck: No JVD, no carotid bruits  Cardiovascular: Regular rate and rhythm, no murmurs appreciated Pulmonary/Chest: Clear to auscultation bilaterally, no wheezes or rails Abdominal: Soft.  no distension.  no tenderness.  Musculoskeletal: Normal range of motion Neurological:  normal muscle tone. Coordination normal. No atrophy Skin: Skin warm and dry Psychiatric: normal affect, pleasant  Recent Labs: 03/01/2020: TSH 1.330    Lipid Panel Lab Results  Component Value Date   CHOL 205 (H) 07/18/2016   HDL 54 07/18/2016   LDLCALC 122 07/18/2016   TRIG 143 07/18/2016      Wt Readings from Last 3 Encounters:   10/24/20 201 lb (91.2 kg)  03/01/20 200 lb 12.8 oz (91.1 kg)  01/20/20 196 lb (88.9 kg)      ASSESSMENT AND PLAN:  Smoker Long discussion concerning smoking cessation She does not want Chantix or other modalities Does not seem to have any desire to quit smoking right now  Coronary artery disease involving coronary bypass graft of native heart without angina pectoris Calcium on CT scan Recommend smoking cessation, stay on lovastatin  Aortic atherosclerosis (HCC)  mild to moderate in nature,  On lovastatin Recommended she quit smoking  PAD (peripheral artery disease) (HCC) Mild to moderate disease of the common iliac, femoral arteries bilaterally, distal descending aorta Recommended smoking cessation  Statin lovastatin  Pure hypercholesterolemia On lovastatin 20    Total encounter time more than 25 minutes  Greater than 50% was spent in counseling and coordination of care with the patient   No orders of the defined types were  placed in this encounter.    Signed, Esmond Plants, M.D., Ph.D. 10/24/2020  [place], [place]

## 2020-10-24 ENCOUNTER — Encounter: Payer: Self-pay | Admitting: Cardiovascular Disease

## 2020-10-24 ENCOUNTER — Ambulatory Visit (INDEPENDENT_AMBULATORY_CARE_PROVIDER_SITE_OTHER): Payer: Medicaid Other | Admitting: Cardiovascular Disease

## 2020-10-24 ENCOUNTER — Other Ambulatory Visit: Payer: Self-pay

## 2020-10-24 VITALS — BP 130/90 | HR 78 | Ht 65.0 in | Wt 201.0 lb

## 2020-10-24 DIAGNOSIS — I25118 Atherosclerotic heart disease of native coronary artery with other forms of angina pectoris: Secondary | ICD-10-CM | POA: Diagnosis not present

## 2020-10-24 DIAGNOSIS — I739 Peripheral vascular disease, unspecified: Secondary | ICD-10-CM

## 2020-10-24 DIAGNOSIS — I700 Atherosclerosis of aorta: Secondary | ICD-10-CM

## 2020-10-24 DIAGNOSIS — N280 Ischemia and infarction of kidney: Secondary | ICD-10-CM | POA: Diagnosis not present

## 2020-10-24 DIAGNOSIS — E7800 Pure hypercholesterolemia, unspecified: Secondary | ICD-10-CM

## 2020-10-24 NOTE — Patient Instructions (Addendum)
Medication Instructions:  No changes  If you need a refill on your cardiac medications before your next appointment, please call your pharmacy.    Lab work: No new labs needed   If you have labs (blood work) drawn today and your tests are completely normal, you will receive your results only by: . MyChart Message (if you have MyChart) OR . A paper copy in the mail If you have any lab test that is abnormal or we need to change your treatment, we will call you to review the results.   Testing/Procedures: No new testing needed   Follow-Up: At [facility], you and your health needs are our priority.  As part of our continuing mission to provide you with exceptional heart care, we have created designated Provider Care Teams.  These Care Teams include your primary Cardiologist (physician) and Advanced Practice Providers (APPs -  Physician Assistants and Nurse Practitioners) who all work together to provide you with the care you need, when you need it.  . You will need a follow up appointment as needed  . Providers on your designated Care Team:   . Christopher Berge, NP . Ryan Dunn, PA-C . Jacquelyn Visser, PA-C  Any Other Special Instructions Will Be Listed Below (If Applicable).  COVID-19 Vaccine Information can be found at: [email or web address] For questions related to vaccine distribution or appointments, please email [email or web address] or call [phone].     

## 2020-11-02 ENCOUNTER — Telehealth: Payer: Self-pay

## 2020-11-02 NOTE — Telephone Encounter (Signed)
Patient advised that Dr. Caryn Section is not accepting new patient's. Patient states she had an appointment to establish care with Sharyn Lull on tomorrow (11/03/2020), but someone from our office called and cancelled her appointment because Sharyn Lull was not available. Patient wants to establish care with whoever has the soonest opening for a new patient visit. Please call patient and schedule.

## 2020-11-02 NOTE — Telephone Encounter (Signed)
Copied from [place] [phone]. Topic: Appointment Scheduling - Scheduling Inquiry for Clinic >> Oct 31, 2020  3:00 PM Erick Blinks wrote: Reason for CRM: Pt wants to be seen with Dr. Caryn Section, she was referred to him by another provider.   Best contact: [phone]

## 2020-11-02 NOTE — Telephone Encounter (Signed)
Patient has had 2 new patient "no shows" to our office. Cannot reschedule.

## 2020-11-03 ENCOUNTER — Ambulatory Visit: Payer: Medicaid Other | Admitting: Adult Health

## 2020-11-25 ENCOUNTER — Other Ambulatory Visit: Payer: Self-pay | Admitting: Obstetrics and Gynecology

## 2021-01-19 ENCOUNTER — Telehealth: Payer: Self-pay | Admitting: Cardiovascular Disease

## 2021-01-19 NOTE — Telephone Encounter (Signed)
*  STAT* If patient is at the pharmacy, call can be transferred to refill team.   1. Which medications need to be refilled? (please list name of each medication and dose if known) lovastatin   2. Which pharmacy/location (including street and city if local pharmacy) is medication to be sent to? [facility]   3. Do they need a 30 day or 90 day supply? [place]

## 2021-01-19 NOTE — Telephone Encounter (Signed)
This medication has never been prescribed by Dr. Rockey Situ, it is for Lovastatin from historical provider. Please advise if refill is appropriate and send to pharmacy on file per patient's request.

## 2021-01-23 ENCOUNTER — Other Ambulatory Visit: Payer: Self-pay

## 2021-01-23 ENCOUNTER — Other Ambulatory Visit: Payer: Self-pay | Admitting: Obstetrics and Gynecology

## 2021-01-23 ENCOUNTER — Other Ambulatory Visit: Payer: Self-pay | Admitting: Cardiovascular Disease

## 2021-01-23 MED ORDER — LOVASTATIN 20 MG PO TABS: 20.0000 mg | ORAL_TABLET | Freq: Every day | ORAL | 3 refills | Status: DC

## 2021-01-23 NOTE — Telephone Encounter (Signed)
Patient calling back in to clarify why she is unable to get her refills at this time. Patient states the original prescription was from Dr. Rockey Situ and has been on it for 3 years by him. Patient would like to him to refill this and send it [place]

## 2021-01-23 NOTE — Telephone Encounter (Signed)
New message    1. Which medications need to be refilled? (please list name of each medication and dose if known) estrogens, conjugated, (PREMARIN) 0.625 MG tablet  2. Which pharmacy/location (including street and city if local pharmacy) is medication to be sent to? [place]

## 2021-01-23 NOTE — Telephone Encounter (Signed)
Lovastatin 20 mg Daily at bedtime refilled, noted that original provider Dr. Marcelline Mates prescribe medication, then Dr. Rockey Situ has been providing refills since 2019, last refill sent in on 01/20/2020. Per progress notes on 10/24/2020 "Pure hypercholesterolemia, On lovastatin 20". Script sent in to [facility].

## 2021-01-25 ENCOUNTER — Other Ambulatory Visit: Payer: Self-pay | Admitting: Cardiovascular Disease

## 2021-01-25 NOTE — Telephone Encounter (Signed)
Lovastatin was sent in yesterday to pt's pharmacy, refer to phone note on 01/23/21

## 2021-01-25 NOTE — Telephone Encounter (Signed)
Okay to refill lovastatin

## 2021-03-07 ENCOUNTER — Encounter: Payer: Medicaid Other | Admitting: Obstetrics and Gynecology

## 2021-03-20 ENCOUNTER — Encounter: Payer: Self-pay | Admitting: Obstetrics and Gynecology

## 2021-03-23 ENCOUNTER — Telehealth: Payer: Self-pay | Admitting: Obstetrics and Gynecology

## 2021-03-23 NOTE — Telephone Encounter (Signed)
Monserrat called in and states she needs a refill on Premarin.  She would like that sent to [facility].  Patient did not specify 30 or 90 day, she stated whichever was easiest. Please advise.

## 2021-03-24 ENCOUNTER — Telehealth: Payer: Self-pay

## 2021-03-24 NOTE — Telephone Encounter (Signed)
Please advise on refill or an appt.  Thanks [employer]

## 2021-03-24 NOTE — Telephone Encounter (Signed)
Pt called back stating that she was going out of town on Wednesday evening and wanted to know if she needed to come in before then or could she get a refill until her annual exam in July 2022.

## 2021-03-24 NOTE — Telephone Encounter (Signed)
Error

## 2021-03-24 NOTE — Telephone Encounter (Signed)
Pt called no answer LM via Vm to please contact the office to schedule an appointment with [facility] before anymore refills be given.

## 2021-03-26 NOTE — Telephone Encounter (Signed)
She can have 1 tube with no refills.

## 2021-03-27 ENCOUNTER — Other Ambulatory Visit: Payer: Self-pay

## 2021-03-27 MED ORDER — ESTROGENS CONJUGATED 0.625 MG PO TABS: 0.6250 mg | ORAL_TABLET | Freq: Every day | ORAL | 1 refills | Status: DC

## 2021-03-30 NOTE — Telephone Encounter (Signed)
Completed please see medication refill orders.

## 2021-04-26 ENCOUNTER — Other Ambulatory Visit: Payer: Self-pay | Admitting: Obstetrics and Gynecology

## 2021-04-27 ENCOUNTER — Telehealth: Payer: Self-pay | Admitting: Obstetrics and Gynecology

## 2021-04-27 NOTE — Telephone Encounter (Signed)
Patient called stating that she just received a call from her pharmacy about there recent RX : Premarin. States that the instructions are incorrect that it needs to be take one tablet daily. Confirmed pharmacy as [facility]. Please Advise.

## 2021-04-28 ENCOUNTER — Other Ambulatory Visit: Payer: Self-pay

## 2021-04-28 NOTE — Telephone Encounter (Signed)
Pt called no answer LM via VM that I had reviewed her medication Premarin that was sent in by [facility] on 04/26/2021 and the rx stated take one tablet once a day. Pt was advised that if she had any other questions or concerns to please contact the office.

## 2021-04-28 NOTE — Telephone Encounter (Signed)
Pt called phone number is not working.

## 2021-04-28 NOTE — Telephone Encounter (Signed)
Pt called no answer LM via VM that I had reviewed her medication Premarin that was sent in by [facility] on 04/26/2021 and the rx stated take one tablet once a day. Pt was advised that if she had any other questions or concerns to please contact the office.

## 2021-05-03 ENCOUNTER — Other Ambulatory Visit: Payer: Self-pay | Admitting: Obstetrics and Gynecology

## 2021-05-03 ENCOUNTER — Telehealth: Payer: Self-pay | Admitting: Obstetrics and Gynecology

## 2021-05-03 NOTE — Telephone Encounter (Signed)
Patient called again this afternoon asking about medication refill. Please Advise.

## 2021-05-03 NOTE — Telephone Encounter (Signed)
Patricia Rollins called in and states she needs a new prescription for Nystatin powder called in to [facility].  Patient states the prescription is expired.  Patient has an appointment in July.

## 2021-05-04 ENCOUNTER — Telehealth: Payer: Self-pay

## 2021-05-04 MED ORDER — NYSTATIN 100000 UNIT/GM EX POWD: 1.0000 "application " | Freq: Two times a day (BID) | CUTANEOUS | 1 refills | Status: DC

## 2021-05-04 NOTE — Telephone Encounter (Signed)
Spoke to pt and she is aware that her medication had been sent to her pharmacy

## 2021-05-04 NOTE — Telephone Encounter (Signed)
Spoke to pt this morning and sent in rx for medication. Pt is aware. Please see another phone encounter.

## 2021-05-04 NOTE — Telephone Encounter (Signed)
Called Pharmacy to check on the message I received  concerning the pt medication nystatin. Pharmacy stated that they didn't have the medication at first but the medication came in. Pharmacy stated that they would make the pt aware.

## 2021-05-04 NOTE — Telephone Encounter (Signed)
Patient called asking for follow up on messages left yesterday- pt wants to know if she needs an apt for refill or if she can get refill sent in. Please Advise.

## 2021-05-17 ENCOUNTER — Encounter: Payer: Medicaid Other | Admitting: Obstetrics and Gynecology

## 2021-05-25 ENCOUNTER — Encounter: Payer: Self-pay | Admitting: Obstetrics and Gynecology

## 2021-06-28 ENCOUNTER — Ambulatory Visit (INDEPENDENT_AMBULATORY_CARE_PROVIDER_SITE_OTHER): Payer: Medicaid Other | Admitting: Obstetrics and Gynecology

## 2021-06-28 ENCOUNTER — Encounter: Payer: Self-pay | Admitting: Obstetrics and Gynecology

## 2021-06-28 ENCOUNTER — Other Ambulatory Visit: Payer: Self-pay

## 2021-06-28 VITALS — BP 121/84 | HR 99 | Ht 65.0 in | Wt 197.5 lb

## 2021-06-28 DIAGNOSIS — Z7189 Other specified counseling: Secondary | ICD-10-CM | POA: Diagnosis not present

## 2021-06-28 DIAGNOSIS — Z23 Encounter for immunization: Secondary | ICD-10-CM

## 2021-06-28 DIAGNOSIS — N952 Postmenopausal atrophic vaginitis: Secondary | ICD-10-CM

## 2021-06-28 DIAGNOSIS — Z1231 Encounter for screening mammogram for malignant neoplasm of breast: Secondary | ICD-10-CM

## 2021-06-28 DIAGNOSIS — Z0189 Encounter for other specified special examinations: Secondary | ICD-10-CM | POA: Diagnosis not present

## 2021-06-28 DIAGNOSIS — Z79890 Hormone replacement therapy (postmenopausal): Secondary | ICD-10-CM | POA: Diagnosis not present

## 2021-06-28 MED ORDER — TETANUS-DIPHTH-ACELL PERTUSSIS 5-2.5-18.5 LF-MCG/0.5 IM SUSY
0.5000 mL | PREFILLED_SYRINGE | Freq: Once | INTRAMUSCULAR | Status: AC
  Administered 2021-06-28: 0.5 mL via INTRAMUSCULAR

## 2021-06-28 MED ORDER — PREMARIN 0.625 MG/GM VA CREA: TOPICAL_CREAM | VAGINAL | 3 refills | Status: AC

## 2021-06-28 MED ORDER — ESTROGENS CONJUGATED 0.625 MG PO TABS: 0.6250 mg | ORAL_TABLET | Freq: Every day | ORAL | 3 refills | Status: DC

## 2021-06-28 NOTE — Progress Notes (Signed)
    GYNECOLOGY PROGRESS NOTE  Subjective:    Patient ID: Patricia Rollins, female    DOB: 1961-05-02, 60 y.o.   MRN: [id number]  HPI  Patient is a 60 y.o. [id number] female who presents for medication refill of premarin vaginal cream and tablets for menopausal vasomotor symptoms and vaginal atrophy.  Is overdue for an annual exam, but has missed several appointments. Denies any gynecologic complaints today.    The following portions of the patient's history were reviewed and updated as appropriate: allergies, current medications, past family history, past medical history, past social history, past surgical history, and problem list.  Review of Systems A comprehensive review of systems was negative except for: Musculoskeletal: positive for back pain (chronic) and skin dryness of left elbow, despite use of OTC treatments such as Gold Bond, Aquaphor, oatmeal.   Objective:   Blood pressure 121/84, pulse 99, height '5\' 5"'$  (1.651 m), weight 197 lb 8 oz (89.6 kg). Body mass index is 32.87 kg/m. General appearance: alert, cooperative, appears stated age, and no distress Abdomen: soft, non-tender; bowel sounds normal; no masses,  no organomegaly Pelvic: deferred Extremities: extremities normal, atraumatic, no cyanosis or edema. Small mildly erythemic area of skin at elbow.  Neurologic: Grossly normal   Assessment:   1. Encounter for medication review and counseling   2. Post-menopause on HRT (hormone replacement therapy)   3. Atrophic vaginitis   4. Breast cancer screening by mammogram   5. Encounter for laboratory test   6. Need for Tdap vaccination      Plan:   Medications reviewed, refilled Premarin cream and tablets. Advised to consider weaning from tablets over time.  Patient overdue for breast screening. Does have history of f/u diagnostic mammogram and breast biopsy with prior screening mammogram several years ago. Has not had any further imaging. Will schedule mammogram.  Patient  desires to have annual labs performed. Claims she has been seen by her PCP within the past year but they did not draw labs either. Will order.  Reviewed care gaps, overdue for several vaccinations including Shingles and Tetanus. Tdap available in the office today, administered. Plans to get shingles vaccine at her local pharmacy.    Rubie Maid, MD [facility]

## 2021-06-28 NOTE — Patient Instructions (Signed)
Td (Tetanus, Diphtheria) Vaccine: What You Need to Know 1. Why get vaccinated? Td vaccine can prevent tetanus and diphtheria. Tetanus enters the body through cuts or wounds. Diphtheria spreads from person to person. TETANUS (T) causes painful stiffening of the muscles. Tetanus can lead to serious health problems, including being unable to open the mouth, having trouble swallowing and breathing, or death. DIPHTHERIA (D) can lead to difficulty breathing, heart failure, paralysis, or death. 2. Td vaccine Td is only for children 7 years and older, adolescents, and adults.  Td is usually given as a booster dose every 10 years, or after 5 years in the case of a severe or dirty wound or burn. Another vaccine, called "Tdap," may be used instead of Td. Tdap protects against pertussis, also known as "whooping cough," in addition to tetanus anddiphtheria. Td may be given at the same time as other vaccines. 3. Talk with your health care provider Tell your vaccination provider if the person getting the vaccine: Has had an allergic reaction after a previous dose of any vaccine that protects against tetanus or diphtheria, or has any severe, life-threatening allergies Has ever had Guillain-Barr Syndrome (also called "GBS") Has had severe pain or swelling after a previous dose of any vaccine that protects against tetanus or diphtheria In some cases, your health care provider may decide to postpone Td vaccinationuntil a future visit. People with minor illnesses, such as a cold, may be vaccinated. People who are moderately or severely ill should usually wait until they recover beforegetting Td vaccine.  Your health care provider can give you more information. 4. Risks of a vaccine reaction Pain, redness, or swelling where the shot was given, mild fever, headache, feeling tired, and nausea, vomiting, diarrhea, or stomachache sometimes happen after Td vaccination. People sometimes faint after medical procedures,  including vaccination. Tellyour provider if you feel dizzy or have vision changes or ringing in the ears.  As with any medicine, there is a very remote chance of a vaccine causing asevere allergic reaction, other serious injury, or death. 5. What if there is a serious problem? An allergic reaction could occur after the vaccinated person leaves the clinic. If you see signs of a severe allergic reaction (hives, swelling of the face and throat, difficulty breathing, a fast heartbeat, dizziness, or weakness), call 9-1-1and get the person to the nearest hospital.  For other signs that concern you, call your health care provider.  Adverse reactions should be reported to the Vaccine Adverse Event Reporting System (VAERS). Your health care provider will usually file this report, or you can do it yourself. Visit the VAERS website at www.vaers.SamedayNews.es or call 432-700-4782. VAERS is only for reporting reactions, and VAERS staff members do not give medical advice. 6. The National Vaccine Injury Compensation Program The [employer] Vaccine Injury Compensation Program (VICP) is a federal program that was created to compensate people who may have been injured by certain vaccines. Claims regarding alleged injury or death due to vaccination have a time limit for filing, which may be as short as two years. Visit the VICP website at GoldCloset.com.ee or call 502-802-3988to learn about the program and about filing a claim. 7. How can I learn more? Ask your health care provider. Call your local or state health department. Visit the website of the Food and Drug Administration (FDA) for vaccine package inserts and additional information at TraderRating.uy. Contact the Centers for Disease Control and Prevention (CDC): Call 9361945702 (1-800-CDC-INFO) or Visit CDC's website at http://hunter.com/. Vaccine Information Statement  Td (Tetanus, Diphtheria) Vaccine (06/17/2020) This  information is not intended to replace advice given to you by your health care provider. Make sure you discuss any questions you have with your healthcare provider. Document Revised: 08/04/2020 Document Reviewed: 08/04/2020 Elsevier Patient Education  2022 [employer].

## 2021-06-29 ENCOUNTER — Telehealth: Payer: Self-pay | Admitting: Obstetrics and Gynecology

## 2021-06-29 LAB — LIPID PANEL
Chol/HDL Ratio: 2.9 ratio (ref 0.0–4.4)
Cholesterol, Total: 224 mg/dL — ABNORMAL HIGH (ref 100–199)
HDL: 76 mg/dL (ref >39)
LDL Chol Calc (NIH): 118 mg/dL — ABNORMAL HIGH (ref 0–99)
Triglycerides: 175 mg/dL — ABNORMAL HIGH (ref 0–149)
VLDL Cholesterol Cal: 30 mg/dL (ref 5–40)

## 2021-06-29 LAB — CBC
Hematocrit: 42.6 % (ref 34.0–46.6)
Hemoglobin: 14.2 g/dL (ref 11.1–15.9)
MCH: 31.3 pg (ref 26.6–33.0)
MCHC: 33.3 g/dL (ref 31.5–35.7)
MCV: 94 fL (ref 79–97)
Platelets: 370 10*3/uL (ref 150–450)
RBC: 4.53 x10E6/uL (ref 3.77–5.28)
RDW: 12.7 % (ref 11.7–15.4)
WBC: 7.4 10*3/uL (ref 3.4–10.8)

## 2021-06-29 LAB — COMPREHENSIVE METABOLIC PANEL
ALT: 12 IU/L (ref 0–32)
AST: 15 IU/L (ref 0–40)
Albumin/Globulin Ratio: 1.4 (ref 1.2–2.2)
Albumin: 4.2 g/dL (ref 3.8–4.9)
Alkaline Phosphatase: 97 IU/L (ref 44–121)
BUN/Creatinine Ratio: 13 (ref 12–28)
BUN: 10 mg/dL (ref 8–27)
Bilirubin Total: <0.2 mg/dL (ref 0.0–1.2)
CO2: 24 mmol/L (ref 20–29)
Calcium: 9.6 mg/dL (ref 8.7–10.3)
Chloride: 103 mmol/L (ref 96–106)
Creatinine, Ser: 0.76 mg/dL (ref 0.57–1.00)
Globulin, Total: 3 g/dL (ref 1.5–4.5)
Glucose: 123 mg/dL — ABNORMAL HIGH (ref 65–99)
Potassium: 4.4 mmol/L (ref 3.5–5.2)
Sodium: 142 mmol/L (ref 134–144)
Total Protein: 7.2 g/dL (ref 6.0–8.5)
eGFR: 90 mL/min/{1.73_m2} (ref >59)

## 2021-06-29 LAB — HEMOGLOBIN A1C
Est. average glucose Bld gHb Est-mCnc: 114 mg/dL
Hgb A1c MFr Bld: 5.6 % (ref 4.8–5.6)

## 2021-06-29 NOTE — Telephone Encounter (Signed)
Anderson Malta from [facility] called asking for clarification on RX instructions. Please Advise.

## 2021-06-29 NOTE — Telephone Encounter (Signed)
Spoke to pharmacy concerning requesting more information about premarin vaginal cream. Pharmacy wanted to know how the medication was to be administered. Provided pharmacy with all information needed to get the medication filled for the pt. One applicator twice daily.

## 2021-07-22 ENCOUNTER — Telehealth: Payer: Medicaid Other | Admitting: Nurse Practitioner

## 2021-07-22 DIAGNOSIS — M5441 Lumbago with sciatica, right side: Secondary | ICD-10-CM

## 2021-07-22 MED ORDER — PREDNISONE 10 MG (21) PO TBPK: ORAL_TABLET | ORAL | 0 refills | Status: DC

## 2021-07-22 NOTE — Progress Notes (Signed)
Virtual Visit Consent   EULA [name], you are scheduled for a virtual visit with [name], [place], a [facility] provider, today.     Just as with appointments in the office, your consent must be obtained to participate.  Your consent will be active for this visit and any virtual visit you may have with one of our providers in the next 365 days.     If you have a MyChart account, a copy of this consent can be sent to you electronically.  All virtual visits are billed to your insurance company just like a traditional visit in the office.    As this is a virtual visit, video technology does not allow for your provider to perform a traditional examination.  This may limit your provider's ability to fully assess your condition.  If your provider identifies any concerns that need to be evaluated in person or the need to arrange testing (such as labs, EKG, etc.), we will make arrangements to do so.     Although advances in technology are sophisticated, we cannot ensure that it will always work on either your end or our end.  If the connection with a video visit is poor, the visit may have to be switched to a telephone visit.  With either a video or telephone visit, we are not always able to ensure that we have a secure connection.     I need to obtain your verbal consent now.   Are you willing to proceed with your visit today? YES   JAMICIA HSIEH has provided verbal consent on 07/22/2021 for a virtual visit (video or telephone).   [name], FNP   Date: 07/22/2021 4:08 PM   Virtual Visit via Video Note   I, [name], connected with Patricia Rollins ([id number], Mar 19, 1961) on 07/22/21 at  4:15 PM EDT by a video-enabled telemedicine application and verified that I am speaking with the correct person using two identifiers.  Location: Patient: Virtual Visit Location Patient: Home Provider: Virtual Visit Location Provider: Mobile   I discussed the limitations of  evaluation and management by telemedicine and the availability of in person appointments. The patient expressed understanding and agreed to proceed.    History of Present Illness: Patricia Rollins is a 60 y.o. who identifies as a female who was assigned female at birth, and is being seen today for nerve pain.  HPI: Patient states she has chronic low back pain for 8 months now. She has been dx with pinched nerve at l4-L5. She goes to [facility] for treatment. Last night the pain was radiating down right leg. Kept her up all night last night. Rates pain 8/10. Heating pad helps some along with tylenol. Sitting or standing as well as walking increases pain. She has follow up appointment at the end of the month with [facility], they want her to have a back injection.   Problems:  Patient Active Problem List   Diagnosis Date Noted   No-show for appointment 09/27/2020   Incisional hernia, without obstruction or gangrene 01/07/2020   Vaginal atrophy 06/28/2017   Coronary artery disease involving native coronary artery of native heart 03/06/2017   Stable angina pectoris ([place]) 03/06/2017   Renal infarct ([place]) 02/26/2017   Abdominal pain 02/26/2017   Midline thoracic back pain 02/01/2017   LUQ abdominal tenderness 01/03/2017   Renal lesion 01/03/2017   Spinal stenosis of lumbar region 12/25/2016   Epigastric abdominal pain 11/21/2016   Constipation 10/23/2016  Lipoma of chest wall 10/23/2016   Coronary artery disease involving coronary bypass graft of native heart without angina pectoris 09/13/2016   Aortic atherosclerosis ([place]) 09/13/2016   PAD (peripheral artery disease) ([place]) 09/13/2016   Pure hypercholesterolemia 09/13/2016   Marijuana use 08/09/2016   Chest pain of uncertain etiology [id number]   Chronic lower extremity pain (Bilateral) (L>R) 07/26/2016   Chronic upper extremity pain (Bilateral) (L>R) 07/26/2016   Tobacco use disorder 07/25/2016   Cigarette nicotine dependence 07/25/2016    Lumbar facet syndrome (Bilateral) (R>L) 07/25/2016   Chronic sacroiliac joint pain (Bilateral) (R>L) 07/25/2016   Chronic shoulder pain (Left) 07/25/2016   Peripheral neuropathy (HCC) (lower extremity) (Bilateral) (L>R) 07/25/2016   Neuropathic pain 07/25/2016   Musculoskeletal pain 07/25/2016   Chronic neck pain (Bilateral) (L>R) 07/25/2016   Long term current use of opiate analgesic 07/24/2016   Encounter for therapeutic drug level monitoring 07/24/2016   Encounter for pain management planning 07/24/2016   Long term prescription benzodiazepine use 07/24/2016   Chronic hip pain (Location of Primary Source of Pain) (Bilateral) (R>L) 07/24/2016   Annual physical exam 07/18/2016   Colon cancer screening 07/18/2016   Tongue lesion 06/21/2016   Abnormal mammogram 11/08/2015   Trochanteric bursitis (Bilateral) 08/16/2015   Hot flash, menopausal 08/16/2015   Compulsive tobacco user syndrome 08/16/2015   Chronic low back pain (Location of Secondary source of pain) (Bilateral) (R>L) 08/15/2015   CD (contact dermatitis) 08/15/2015   Menopausal and perimenopausal disorder 08/15/2015   Chronic lumbar pain 05/12/2015   Fibromyalgia 05/12/2015   Generalized anxiety disorder 05/12/2015   Chronic pain disorder 07/12/2010   Lumbosacral neuritis 02/23/2010    Allergies:  Allergies  Allergen Reactions   Fluoxetine Other (See Comments)    Confusion    Sulfa Antibiotics Nausea Only   Sulfasalazine Nausea Only   Medications:  Current Outpatient Medications:    ALPRAZolam (XANAX) 1 MG tablet, Take 1 mg by mouth 3 (three) times daily. , Disp: , Rfl:    aspirin EC 81 MG tablet, Take 81 mg by mouth daily. , Disp: , Rfl:    Cholecalciferol (VITAMIN D3) 25 MCG (1000 UT) CAPS, Take 1,000 Units by mouth daily. , Disp: , Rfl:    cimetidine (TAGAMET) 200 MG tablet, Take 200 mg by mouth 2 (two) times daily., Disp: , Rfl:    conjugated estrogens (PREMARIN) vaginal cream, Place vaginally 2 (two) times a  week., Disp: 30 g, Rfl: 3   estrogens, conjugated, (PREMARIN) 0.625 MG tablet, Take 1 tablet (0.625 mg total) by mouth daily., Disp: 90 tablet, Rfl: 3   gabapentin (NEURONTIN) 300 MG capsule, Take 300 mg by mouth at bedtime., Disp: , Rfl:    HYDROcodone-acetaminophen (NORCO) 10-325 MG tablet, Take 0.5 tablets by mouth 3 (three) times daily., Disp: , Rfl:    loratadine (CLARITIN) 10 MG tablet, Take 10 mg by mouth daily., Disp: , Rfl:    lovastatin (MEVACOR) 20 MG tablet, Take 1 tablet (20 mg total) by mouth at bedtime., Disp: 90 tablet, Rfl: 3   NYAMYC powder, APPLY SMALL AMOUNT TOPICALLY EVERY DAY AS NEEDED., Disp: 60 g, Rfl: 1   nystatin (MYCOSTATIN/NYSTOP) powder, Apply 1 application topically 2 (two) times daily., Disp: 60 g, Rfl: 1   tiZANidine (ZANAFLEX) 4 MG tablet, Take 4 mg by mouth in the morning and at bedtime., Disp: , Rfl:   Observations/Objective: Patient is well-developed, well-nourished in no acute distress.  Resting comfortably  at home.  Head is normocephalic, atraumatic.  No labored  breathing.  Speech is clear and coherent with logical content.  Patient is alert and oriented at baseline.    Assessment and Plan:  HAMDI DEFRANCIS in today with chief complaint of Pain   1. Acute midline low back pain with right-sided sciatica Moist heat  Rest  No bending or stooping Keep follow up with [facility]  Meds ordered this encounter  Medications   predniSONE (STERAPRED UNI-PAK 21 TAB) 10 MG (21) TBPK tablet    Sig: As directed x 6 days    Dispense:  21 tablet    Refill:  0    Order Specific Question:   Supervising Provider    Answer:   Noemi Chapel [[id number]]        Follow Up Instructions: I discussed the assessment and treatment plan with the patient. The patient was provided an opportunity to ask questions and all were answered. The patient agreed with the plan and demonstrated an understanding of the instructions.  A copy of instructions were sent to the patient  via MyChart.  The patient was advised to call back or seek an in-person evaluation if the symptoms worsen or if the condition fails to improve as anticipated.  Time:  I spent 12 minutes with the patient via telehealth technology discussing the above problems/concerns.    [name], FNP

## 2021-07-22 NOTE — Patient Instructions (Signed)
Chronic Back Pain When back pain lasts longer than 3 months, it is called chronic back pain. Pain may get worse at certain times (flare-ups). There are things you can do at home to manage your pain. Follow these instructions at home: Pay attention to any changes in your symptoms. Take these actions to help with your pain: Managing pain and stiffness   If told, put ice on the painful area. Your doctor may tell you to use ice for 24-48 hours after the flare-up starts. To do this: Put ice in a plastic bag. Place a towel between your skin and the bag. Leave the ice on for 20 minutes, 2-3 times a day. If told, put heat on the painful area. Do this as often as told by your doctor. Use the heat source that your doctor recommends, such as a moist heat pack or a heating pad. Place a towel between your skin and the heat source. Leave the heat on for 20-30 minutes. Take off the heat if your skin turns bright red. This is especially important if you are unable to feel pain, heat, or cold. You may have a greater risk of getting burned. Soak in a warm bath. This can help relieve pain. Activity  Avoid bending and other activities that make pain worse. When standing: Keep your upper back and neck straight. Keep your shoulders pulled back. Avoid slouching. When sitting: Keep your back straight. Relax your shoulders. Do not round your shoulders or pull them backward. Do not sit or stand in one place for long periods of time. Take short rest breaks during the day. Lying down or standing is usually better than sitting. Resting can help relieve pain. When sitting or lying down for a long time, do some mild activity or stretching. This will help to prevent stiffness and pain. Get regular exercise. Ask your doctor what activities are safe for you. Do not lift anything that is heavier than 10 lb (4.5 kg) or the limit that you are told, until your doctor says that it is safe. To prevent injury when you lift  things: Bend your knees. Keep the weight close to your body. Avoid twisting. Sleep on a firm mattress. Try lying on your side with your knees slightly bent. If you lie on your back, put a pillow under your knees. Medicines Treatment may include medicines for pain and swelling taken by mouth or put on the skin, prescription pain medicine, or muscle relaxants. Take over-the-counter and prescription medicines only as told by your doctor. Ask your doctor if the medicine prescribed to you: Requires you to avoid driving or using machinery. Can cause trouble pooping (constipation). You may need to take these actions to prevent or treat trouble pooping: Drink enough fluid to keep your pee (urine) pale yellow. Take over-the-counter or prescription medicines. Eat foods that are high in fiber. These include beans, whole grains, and fresh fruits and vegetables. Limit foods that are high in fat and sugars. These include fried or sweet foods. General instructions Do not use any products that contain nicotine or tobacco, such as cigarettes, e-cigarettes, and chewing tobacco. If you need help quitting, ask your doctor. Keep all follow-up visits as told by your doctor. This is important. Contact a doctor if: Your pain does not get better with rest or medicine. Your pain gets worse, or you have new pain. You have a high fever. You lose weight very quickly. You have trouble doing your normal activities. Get help right away if: One   or both of your legs or feet feel weak. One or both of your legs or feet lose feeling (have numbness). You have trouble controlling when you poop (have a bowel movement) or pee (urinate). You have bad back pain and: You feel like you may vomit (nauseous), or you vomit. You have pain in your belly (abdomen). You have shortness of breath. You faint. Summary When back pain lasts longer than 3 months, it is called chronic back pain. Pain may get worse at certain times  (flare-ups). Use ice and heat as told by your doctor. Your doctor may tell you to use ice after flare-ups. This information is not intended to replace advice given to you by your health care provider. Make sure you discuss any questions you have with your health care provider. Document Revised: 12/09/2019 Document Reviewed: 12/09/2019 Elsevier Patient Education  2022 Elsevier Inc.  

## 2021-07-28 ENCOUNTER — Telehealth: Payer: Medicaid Other | Admitting: Physician Assistant

## 2021-07-28 ENCOUNTER — Telehealth: Payer: Medicaid Other | Admitting: Family

## 2021-07-28 DIAGNOSIS — M545 Low back pain, unspecified: Secondary | ICD-10-CM

## 2021-07-28 DIAGNOSIS — G8929 Other chronic pain: Secondary | ICD-10-CM

## 2021-07-28 DIAGNOSIS — M5441 Lumbago with sciatica, right side: Secondary | ICD-10-CM | POA: Diagnosis not present

## 2021-07-28 MED ORDER — PREDNISONE 10 MG (21) PO TBPK: ORAL_TABLET | ORAL | 0 refills | Status: DC

## 2021-07-28 NOTE — Progress Notes (Signed)
Attempted to contact patient multiple times. Never connected. Will close chart.   Evelina Dun, FNP

## 2021-07-28 NOTE — Progress Notes (Signed)
Acute Office Visit  Subjective:    Patient ID: Patricia Rollins, female    DOB: 06-27-1961, 60 y.o.   MRN: [id number]  No chief complaint on file.   60 yo F in NAD with PMH of chornic low back pain on tizanidine 21m , connects via video with complaint of back pain. [facility]MRI were performed and she was informed that she has pinched nerve between L4 and L5 and states referred for surgery possibly in October. Takes Tizanidine 4 mg twice a day. Was given Tramadol and Prednisone pack and states sxs helped. She also has appt with [facility] in couple possible injections of the back.  Patient is in today for back pain   Past Medical History:  Diagnosis Date   Anxiety    Arthritis    patient has bilateral bursitis in hips   CAD (coronary artery disease)    Chronic pancreatitis ([place]    CT of Abd/Pelvis 12-06-17   Colon polyp 05/08/2017   Dr BBary Castilla  Depression    Fibromyalgia    GERD (gastroesophageal reflux disease)    Hematuria 06/21/2016   HLD (hyperlipidemia)    IBS (irritable bowel syndrome)    Neuromuscular disorder ([place]     Past Surgical History:  Procedure Laterality Date   ABDOMINAL HYSTERECTOMY     APPENDECTOMY     COLONOSCOPY WITH PROPOFOL N/A 05/08/2017   Procedure: COLONOSCOPY WITH PROPOFOL;  Surgeon: BRobert Bellow MD;  Location: [facility] ENDOSCOPY;  Service: Endoscopy;  Laterality: N/A;   ESOPHAGOGASTRODUODENOSCOPY (EGD) WITH PROPOFOL N/A 05/08/2017   Procedure: ESOPHAGOGASTRODUODENOSCOPY (EGD) WITH PROPOFOL;  Surgeon: BRobert Bellow MD;  Location: [facility] ENDOSCOPY;  Service: Endoscopy;  Laterality: N/A;   OOPHORECTOMY Right    TONSILLECTOMY AND ADENOIDECTOMY Bilateral     Family History  Problem Relation Age of Onset   Ovarian cancer Mother    Pancreatic cancer Mother    Bladder Cancer Neg Hx    Kidney cancer Neg Hx    Prostate cancer Neg Hx     Social History   Socioeconomic History   Marital status:  Single    Spouse name: Not on file   Number of children: 3   Years of education: Not on file   Highest education level: Not on file  Occupational History   Not on file  Tobacco Use   Smoking status: Some Days    Packs/day: 0.25    Types: Cigarettes   Smokeless tobacco: Never  Vaping Use   Vaping Use: Never used  Substance and Sexual Activity   Alcohol use: No    Alcohol/week: 0.0 standard drinks   Drug use: No   Sexual activity: Never    Birth control/protection: Surgical  Other Topics Concern   Not on file  Social History Narrative   Not on file   Social Determinants of Health   Financial Resource Strain: Not on file  Food Insecurity: Not on file  Transportation Needs: Not on file  Physical Activity: Not on file  Stress: Not on file  Social Connections: Not on file  Intimate Partner Violence: Not on file    Outpatient Medications Prior to Visit  Medication Sig Dispense Refill   ALPRAZolam (XANAX) 1 MG tablet Take 1 mg by mouth 3 (three) times daily.      aspirin EC 81 MG tablet Take 81 mg by mouth daily.      Cholecalciferol (VITAMIN D3) 25 MCG (1000 UT) CAPS  Take 1,000 Units by mouth daily.      cimetidine (TAGAMET) 200 MG tablet Take 200 mg by mouth 2 (two) times daily.     conjugated estrogens (PREMARIN) vaginal cream Place vaginally 2 (two) times a week. 30 g 3   estrogens, conjugated, (PREMARIN) 0.625 MG tablet Take 1 tablet (0.625 mg total) by mouth daily. 90 tablet 3   gabapentin (NEURONTIN) 300 MG capsule Take 300 mg by mouth at bedtime.     HYDROcodone-acetaminophen (NORCO) 10-325 MG tablet Take 0.5 tablets by mouth 3 (three) times daily.     loratadine (CLARITIN) 10 MG tablet Take 10 mg by mouth daily.     lovastatin (MEVACOR) 20 MG tablet Take 1 tablet (20 mg total) by mouth at bedtime. 90 tablet 3   NYAMYC powder APPLY SMALL AMOUNT TOPICALLY EVERY DAY AS NEEDED. 60 g 1   nystatin (MYCOSTATIN/NYSTOP) powder Apply 1 application topically 2 (two) times  daily. 60 g 1   tiZANidine (ZANAFLEX) 4 MG tablet Take 4 mg by mouth in the morning and at bedtime.     predniSONE (STERAPRED UNI-PAK 21 TAB) 10 MG (21) TBPK tablet As directed x 6 days 21 tablet 0   No facility-administered medications prior to visit.    Allergies  Allergen Reactions   Fluoxetine Other (See Comments)    Confusion    Sulfa Antibiotics Nausea Only   Sulfasalazine Nausea Only    Review of Systems  Constitutional:  Negative for chills and fever.  Respiratory:  Negative for shortness of breath.   Cardiovascular:  Negative for chest pain.  Gastrointestinal:  Negative for abdominal pain, nausea and vomiting.  Genitourinary:  Negative for urgency.  Musculoskeletal:  Positive for back pain.  Neurological:  Negative for dizziness.  Psychiatric/Behavioral:  Negative for confusion.       Objective:    Physical Exam  There were no vitals taken for this visit. Wt Readings from Last 3 Encounters:  06/28/21 197 lb 8 oz (89.6 kg)  10/24/20 201 lb (91.2 kg)  03/01/20 200 lb 12.8 oz (91.1 kg)    Health Maintenance Due  Topic Date Due   COVID-19 Vaccine (1) Never done   HIV Screening  Never done   Zoster Vaccines- Shingrix (1 of 2) Never done   PAP SMEAR-Modifier  Never done   MAMMOGRAM  Never done   INFLUENZA VACCINE  Never done    There are no preventive care reminders to display for this patient.   Lab Results  Component Value Date   TSH 1.330 03/01/2020   Lab Results  Component Value Date   WBC 7.4 06/28/2021   HGB 14.2 06/28/2021   HCT 42.6 06/28/2021   MCV 94 06/28/2021   PLT 370 06/28/2021   Lab Results  Component Value Date   NA 142 06/28/2021   K 4.4 06/28/2021   CO2 24 06/28/2021   GLUCOSE 123 (H) 06/28/2021   BUN 10 06/28/2021   CREATININE 0.76 06/28/2021   BILITOT <0.2 06/28/2021   ALKPHOS 97 06/28/2021   AST 15 06/28/2021   ALT 12 06/28/2021   PROT 7.2 06/28/2021   ALBUMIN 4.2 06/28/2021   CALCIUM 9.6 06/28/2021   ANIONGAP 8  01/29/2017   EGFR 90 06/28/2021   Lab Results  Component Value Date   CHOL 224 (H) 06/28/2021   Lab Results  Component Value Date   HDL 76 06/28/2021   Lab Results  Component Value Date   LDLCALC 118 (H) 06/28/2021   Lab Results  Component Value Date   TRIG 175 (H) 06/28/2021   Lab Results  Component Value Date   CHOLHDL 2.9 06/28/2021   Lab Results  Component Value Date   HGBA1C 5.6 06/28/2021       Assessment & Plan:   Problem List Items Addressed This Visit       Other   Chronic low back pain (Location of Secondary source of pain) (Bilateral) (R>L) - Primary (Chronic)   Relevant Medications   predniSONE (STERAPRED UNI-PAK 21 TAB) 10 MG (21) TBPK tablet   Other Visit Diagnoses     Acute midline low back pain with right-sided sciatica       Relevant Medications   predniSONE (STERAPRED UNI-PAK 21 TAB) 10 MG (21) TBPK tablet        Meds ordered this encounter  Medications   predniSONE (STERAPRED UNI-PAK 21 TAB) 10 MG (21) TBPK tablet    Sig: As directed x 6 days    Dispense:  21 tablet    Refill:  0    Order Specific Question:   Supervising Provider    Answer:   Noemi Chapel [[id number]]  This encounter was phone visit only   Josiel Gahm M Baylie Drakes, PA-C

## 2021-07-28 NOTE — Progress Notes (Signed)
Unable to connect with pt via video- pt states she is unable to see/hear me. Called the pt and resent her the link, but continued to have technical issues. Called pt and spoke to her on phone about her symptoms.

## 2021-08-14 ENCOUNTER — Telehealth: Payer: Self-pay | Admitting: Obstetrics and Gynecology

## 2021-08-14 NOTE — Telephone Encounter (Signed)
Pt called requesting a call back from Dr.Cherry's nurse today to review labs results. Please Advise.

## 2021-08-16 NOTE — Telephone Encounter (Signed)
Pt called questioning if she had blood work done to check her kidney levels. Reviewed the results of her last labs done on 06/28/2021 and did not see any labs concerning kidney. Pt stated that she asked for them to be done. Pt stated that she was doing okay and would follow up with her PCP.

## 2021-08-31 LAB — HM MAMMOGRAPHY

## 2022-01-22 ENCOUNTER — Other Ambulatory Visit: Payer: Self-pay

## 2022-01-22 MED ORDER — LOVASTATIN 20 MG PO TABS: 20.0000 mg | ORAL_TABLET | Freq: Every day | ORAL | 0 refills | Status: DC

## 2022-01-22 NOTE — Telephone Encounter (Signed)
*  STAT* If patient is at the pharmacy, call can be transferred to refill team.   1. Which medications need to be refilled? (please list name of each medication and dose if known)  Lovatatin 20 mg  2. Which pharmacy/location (including street and city if local pharmacy) is medication to be sent to? [facility]  3. Do they need a 30 day or 90 day supply? [place]

## 2022-01-28 NOTE — Progress Notes (Deleted)
Cardiology Office Note ? ?Date:  01/28/2022  ? ?ID:  Patricia Rollins, DOB 03-02-1961, MRN [id number] ? ?PCP:  Remi Haggard, FNP  ? ?No chief complaint on file. ? ? ?HPI:  ?Patricia Rollins is a pleasant 61 year old woman with  ?long history of smoking who continues to smoke one pack per day,  ?In nic inhaler ?COPD, ?CAD  ?PAD noted on CT scan of the chest and abdomen,  ?hyperlipidemia,  ?who for follow-up of her coronary disease and peripheral arterial disease. ? ?Numerous issues to discuss today ?Right wrist swelling, bruising ?Images reviewed from her smart phone ? ?When she wakes up the morning reports her left arm is numb, difficulty getting it moving in the morning ?"cant get arm to work", after a while she is able to then move it better ? ?Had back injection, right side back ?Felt worse since then, more of a general malaise ? ?"8-9 doctors" ?Tired of doctors ? ?Continues to have GI issues ?Stomach pain, pain relieved with bowel movement, ?Belching ?Pressure between shoulder blades when eating sometimes ? ?Not on PPI ? ?Previously recommended to increase her lovastatin up to 40, she continues to take 20 ? ?No recent lab work ? ?EKG personally reviewed by myself on todays visit ?Normal sinus rhythm rate 78 bpm nonspecific T wave abnormality ? ? continues to smoke, ?She reports that she started smoking age 32  ?Tried chantix, had vivid dreams,  ? ? ?Other past medical history reviewed ?CT scan chest and abdomen/pelvis performed in September and again in October 2017 ? ?CT scan  results discussed with her again today  ? mild to moderate coronary calcification in the mid LAD, proximal RCA ?Also has mild to moderate aortic arch calcification, moderate distal descending aorta disease, mild to moderate common iliac and bilateral femoral arterial disease ? ? ?PMH:   has a past medical history of Anxiety, Arthritis, CAD (coronary artery disease), Chronic pancreatitis ([place]), Colon polyp (05/08/2017), Depression, Fibromyalgia,  GERD (gastroesophageal reflux disease), Hematuria (06/21/2016), HLD (hyperlipidemia), IBS (irritable bowel syndrome), and Neuromuscular disorder ([place]). ? ?PSH:    ?Past Surgical History:  ?Procedure Laterality Date  ? ABDOMINAL HYSTERECTOMY    ? APPENDECTOMY    ? COLONOSCOPY WITH PROPOFOL N/A 05/08/2017  ? Procedure: COLONOSCOPY WITH PROPOFOL;  Surgeon: Robert Bellow, MD;  Location: [facility] ENDOSCOPY;  Service: Endoscopy;  Laterality: N/A;  ? ESOPHAGOGASTRODUODENOSCOPY (EGD) WITH PROPOFOL N/A 05/08/2017  ? Procedure: ESOPHAGOGASTRODUODENOSCOPY (EGD) WITH PROPOFOL;  Surgeon: Robert Bellow, MD;  Location: [facility] ENDOSCOPY;  Service: Endoscopy;  Laterality: N/A;  ? OOPHORECTOMY Right   ? TONSILLECTOMY AND ADENOIDECTOMY Bilateral   ? ? ?Current Outpatient Medications  ?Medication Sig Dispense Refill  ? ALPRAZolam (XANAX) 1 MG tablet Take 1 mg by mouth 3 (three) times daily.     ? aspirin EC 81 MG tablet Take 81 mg by mouth daily.     ? Cholecalciferol (VITAMIN D3) 25 MCG (1000 UT) CAPS Take 1,000 Units by mouth daily.     ? cimetidine (TAGAMET) 200 MG tablet Take 200 mg by mouth 2 (two) times daily.    ? conjugated estrogens (PREMARIN) vaginal cream Place vaginally 2 (two) times a week. 30 g 3  ? estrogens, conjugated, (PREMARIN) 0.625 MG tablet Take 1 tablet (0.625 mg total) by mouth daily. 90 tablet 3  ? gabapentin (NEURONTIN) 300 MG capsule Take 300 mg by mouth at bedtime.    ? HYDROcodone-acetaminophen (NORCO) 10-325 MG tablet Take 0.5 tablets by mouth 3 (three) times  daily.    ? loratadine (CLARITIN) 10 MG tablet Take 10 mg by mouth daily.    ? lovastatin (MEVACOR) 20 MG tablet Take 1 tablet (20 mg total) by mouth at bedtime. Further refills to be authorized at appointment. Thank you! 30 tablet 0  ? NYAMYC powder APPLY SMALL AMOUNT TOPICALLY EVERY DAY AS NEEDED. 60 g 1  ? nystatin (MYCOSTATIN/NYSTOP) powder Apply 1 application topically 2 (two) times daily. 60 g 1  ? predniSONE (STERAPRED UNI-PAK 21 TAB) 10 MG  (21) TBPK tablet As directed x 6 days 21 tablet 0  ? tiZANidine (ZANAFLEX) 4 MG tablet Take 4 mg by mouth in the morning and at bedtime.    ? ?No current facility-administered medications for this visit.  ? ? ? ?Allergies:   Fluoxetine, Sulfa antibiotics, and Sulfasalazine  ? ?Social History:  The patient  reports that she has been smoking cigarettes. She has been smoking an average of .25 packs per day. She has never used smokeless tobacco. She reports that she does not drink alcohol and does not use drugs.  ? ?Family History:   family history includes Ovarian cancer in her mother; Pancreatic cancer in her mother.  ? ? ?Review of Systems: ?Review of Systems  ?Constitutional: Negative.   ?HENT: Negative.    ?Respiratory: Negative.    ?Cardiovascular: Negative.   ?Gastrointestinal: Negative.   ?Musculoskeletal:  Positive for back pain.  ?Neurological: Negative.   ?Psychiatric/Behavioral: Negative.    ?All other systems reviewed and are negative. ? ?PHYSICAL EXAM: ?VS:  There were no vitals taken for this visit. , BMI There is no height or weight on file to calculate BMI. ?Constitutional:  oriented to person, place, and time. No distress.  ?HENT:  ?Head: Grossly normal ?Eyes:  no discharge. No scleral icterus.  ?Neck: No JVD, no carotid bruits  ?Cardiovascular: Regular rate and rhythm, no murmurs appreciated ?Pulmonary/Chest: Clear to auscultation bilaterally, no wheezes or rails ?Abdominal: Soft.  no distension.  no tenderness.  ?Musculoskeletal: Normal range of motion ?Neurological:  normal muscle tone. Coordination normal. No atrophy ?Skin: Skin warm and dry ?Psychiatric: normal affect, pleasant ? ?Recent Labs: ?06/28/2021: ALT 12; BUN 10; Creatinine, Ser 0.76; Hemoglobin 14.2; Platelets 370; Potassium 4.4; Sodium 142  ? ? ?Lipid Panel ?Lab Results  ?Component Value Date  ? CHOL 224 (H) 06/28/2021  ? HDL 76 06/28/2021  ? LDLCALC 118 (H) 06/28/2021  ? TRIG 175 (H) 06/28/2021  ? ?  ? ?Wt Readings from Last 3  Encounters:  ?06/28/21 197 lb 8 oz (89.6 kg)  ?10/24/20 201 lb (91.2 kg)  ?03/01/20 200 lb 12.8 oz (91.1 kg)  ?  ? ? ?ASSESSMENT AND PLAN: ? ?Smoker ?Long discussion concerning smoking cessation ?She does not want Chantix or other modalities ?Does not seem to have any desire to quit smoking right now ? ?Coronary artery disease involving coronary bypass graft of native heart without angina pectoris ?Calcium on CT scan ?Recommend smoking cessation, stay on lovastatin ? ?Aortic atherosclerosis ([place]) ? mild to moderate in nature,  ?On lovastatin ?Recommended she quit smoking ? ?PAD (peripheral artery disease) ([place]) ?Mild to moderate disease of the common iliac, femoral arteries bilaterally, distal descending aorta ?Recommended smoking cessation  ?Statin lovastatin ? ?Pure hypercholesterolemia ?On lovastatin 20 ? ? ? Total encounter time more than 25 minutes ? Greater than 50% was spent in counseling and coordination of care with the patient ? ? ?No orders of the defined types were placed in this encounter. ? ? ? ?  Signed, ?Esmond Plants, M.D., Ph.D. ?01/28/2022  ?[place] ?[phone] ? ? ?

## 2022-01-29 ENCOUNTER — Ambulatory Visit: Payer: Medicaid Other | Admitting: Cardiovascular Disease

## 2022-01-29 DIAGNOSIS — I739 Peripheral vascular disease, unspecified: Secondary | ICD-10-CM

## 2022-01-29 DIAGNOSIS — I25118 Atherosclerotic heart disease of native coronary artery with other forms of angina pectoris: Secondary | ICD-10-CM

## 2022-01-29 DIAGNOSIS — E7800 Pure hypercholesterolemia, unspecified: Secondary | ICD-10-CM

## 2022-01-29 DIAGNOSIS — I700 Atherosclerosis of aorta: Secondary | ICD-10-CM

## 2022-01-29 DIAGNOSIS — N280 Ischemia and infarction of kidney: Secondary | ICD-10-CM

## 2022-02-15 NOTE — Progress Notes (Deleted)
Cardiology Office Note ? ?Date:  02/15/2022  ? ?ID:  Patricia Rollins, DOB 24-Apr-1961, MRN [id number] ? ?PCP:  Remi Haggard, FNP  ? ?No chief complaint on file. ? ? ?HPI:  ?Patricia Rollins is a pleasant 61 year old woman with  ?long history of smoking who continues to smoke one pack per day,  ?In nic inhaler ?COPD, ?CAD  ?PAD noted on CT scan of the chest and abdomen,  ?hyperlipidemia,  ?who for follow-up of her coronary disease and peripheral arterial disease. ? ?Last seen in clinic by myself December 2021 ? ? ? ? ? ?Numerous issues to discuss today ?Right wrist swelling, bruising ?Images reviewed from her smart phone ? ?When she wakes up the morning reports her left arm is numb, difficulty getting it moving in the morning ?"cant get arm to work", after a while she is able to then move it better ? ?Had back injection, right side back ?Felt worse since then, more of a general malaise ? ?"8-9 doctors" ?Tired of doctors ? ?Continues to have GI issues ?Stomach pain, pain relieved with bowel movement, ?Belching ?Pressure between shoulder blades when eating sometimes ? ?Not on PPI ? ?Previously recommended to increase her lovastatin up to 40, she continues to take 20 ? ?No recent lab work ? ?EKG personally reviewed by myself on todays visit ?Normal sinus rhythm rate 78 bpm nonspecific T wave abnormality ? ? continues to smoke, ?She reports that she started smoking age 56  ?Tried chantix, had vivid dreams,  ? ? ?Other past medical history reviewed ?CT scan chest and abdomen/pelvis performed in September and again in October 2017 ? ?CT scan  results discussed with her again today  ? mild to moderate coronary calcification in the mid LAD, proximal RCA ?Also has mild to moderate aortic arch calcification, moderate distal descending aorta disease, mild to moderate common iliac and bilateral femoral arterial disease ? ? ?PMH:   has a past medical history of Anxiety, Arthritis, CAD (coronary artery disease), Chronic pancreatitis  ([place]), Colon polyp (05/08/2017), Depression, Fibromyalgia, GERD (gastroesophageal reflux disease), Hematuria (06/21/2016), HLD (hyperlipidemia), IBS (irritable bowel syndrome), and Neuromuscular disorder ([place]). ? ?PSH:    ?Past Surgical History:  ?Procedure Laterality Date  ? ABDOMINAL HYSTERECTOMY    ? APPENDECTOMY    ? COLONOSCOPY WITH PROPOFOL N/A 05/08/2017  ? Procedure: COLONOSCOPY WITH PROPOFOL;  Surgeon: Robert Bellow, MD;  Location: [facility] ENDOSCOPY;  Service: Endoscopy;  Laterality: N/A;  ? ESOPHAGOGASTRODUODENOSCOPY (EGD) WITH PROPOFOL N/A 05/08/2017  ? Procedure: ESOPHAGOGASTRODUODENOSCOPY (EGD) WITH PROPOFOL;  Surgeon: Robert Bellow, MD;  Location: [facility] ENDOSCOPY;  Service: Endoscopy;  Laterality: N/A;  ? OOPHORECTOMY Right   ? TONSILLECTOMY AND ADENOIDECTOMY Bilateral   ? ? ?Current Outpatient Medications  ?Medication Sig Dispense Refill  ? ALPRAZolam (XANAX) 1 MG tablet Take 1 mg by mouth 3 (three) times daily.     ? aspirin EC 81 MG tablet Take 81 mg by mouth daily.     ? Cholecalciferol (VITAMIN D3) 25 MCG (1000 UT) CAPS Take 1,000 Units by mouth daily.     ? cimetidine (TAGAMET) 200 MG tablet Take 200 mg by mouth 2 (two) times daily.    ? conjugated estrogens (PREMARIN) vaginal cream Place vaginally 2 (two) times a week. 30 g 3  ? estrogens, conjugated, (PREMARIN) 0.625 MG tablet Take 1 tablet (0.625 mg total) by mouth daily. 90 tablet 3  ? gabapentin (NEURONTIN) 300 MG capsule Take 300 mg by mouth at bedtime.    ?  HYDROcodone-acetaminophen (NORCO) 10-325 MG tablet Take 0.5 tablets by mouth 3 (three) times daily.    ? loratadine (CLARITIN) 10 MG tablet Take 10 mg by mouth daily.    ? lovastatin (MEVACOR) 20 MG tablet Take 1 tablet (20 mg total) by mouth at bedtime. Further refills to be authorized at appointment. Thank you! 30 tablet 0  ? NYAMYC powder APPLY SMALL AMOUNT TOPICALLY EVERY DAY AS NEEDED. 60 g 1  ? nystatin (MYCOSTATIN/NYSTOP) powder Apply 1 application topically 2 (two) times  daily. 60 g 1  ? predniSONE (STERAPRED UNI-PAK 21 TAB) 10 MG (21) TBPK tablet As directed x 6 days 21 tablet 0  ? tiZANidine (ZANAFLEX) 4 MG tablet Take 4 mg by mouth in the morning and at bedtime.    ? ?No current facility-administered medications for this visit.  ? ? ? ?Allergies:   Fluoxetine, Sulfa antibiotics, and Sulfasalazine  ? ?Social History:  The patient  reports that she has been smoking cigarettes. She has been smoking an average of .25 packs per day. She has never used smokeless tobacco. She reports that she does not drink alcohol and does not use drugs.  ? ?Family History:   family history includes Ovarian cancer in her mother; Pancreatic cancer in her mother.  ? ? ?Review of Systems: ?Review of Systems  ?Constitutional: Negative.   ?HENT: Negative.    ?Respiratory: Negative.    ?Cardiovascular: Negative.   ?Gastrointestinal: Negative.   ?Musculoskeletal:  Positive for back pain.  ?Neurological: Negative.   ?Psychiatric/Behavioral: Negative.    ?All other systems reviewed and are negative. ? ?PHYSICAL EXAM: ?VS:  There were no vitals taken for this visit. , BMI There is no height or weight on file to calculate BMI. ?Constitutional:  oriented to person, place, and time. No distress.  ?HENT:  ?Head: Grossly normal ?Eyes:  no discharge. No scleral icterus.  ?Neck: No JVD, no carotid bruits  ?Cardiovascular: Regular rate and rhythm, no murmurs appreciated ?Pulmonary/Chest: Clear to auscultation bilaterally, no wheezes or rails ?Abdominal: Soft.  no distension.  no tenderness.  ?Musculoskeletal: Normal range of motion ?Neurological:  normal muscle tone. Coordination normal. No atrophy ?Skin: Skin warm and dry ?Psychiatric: normal affect, pleasant ? ?Recent Labs: ?06/28/2021: ALT 12; BUN 10; Creatinine, Ser 0.76; Hemoglobin 14.2; Platelets 370; Potassium 4.4; Sodium 142  ? ? ?Lipid Panel ?Lab Results  ?Component Value Date  ? CHOL 224 (H) 06/28/2021  ? HDL 76 06/28/2021  ? LDLCALC 118 (H) 06/28/2021  ? TRIG  175 (H) 06/28/2021  ? ?  ? ?Wt Readings from Last 3 Encounters:  ?06/28/21 89.6 kg  ?10/24/20 91.2 kg  ?03/01/20 91.1 kg  ?  ? ? ?ASSESSMENT AND PLAN: ? ?Smoker ?Long discussion concerning smoking cessation ?She does not want Chantix or other modalities ?Does not seem to have any desire to quit smoking right now ? ?Coronary artery disease involving coronary bypass graft of native heart without angina pectoris ?Calcium on CT scan ?Recommend smoking cessation, stay on lovastatin ? ?Aortic atherosclerosis ([place]) ? mild to moderate in nature,  ?On lovastatin ?Recommended she quit smoking ? ?PAD (peripheral artery disease) ([place]) ?Mild to moderate disease of the common iliac, femoral arteries bilaterally, distal descending aorta ?Recommended smoking cessation  ?Statin lovastatin ? ?Pure hypercholesterolemia ?On lovastatin 20 ? ? ? Total encounter time more than 25 minutes ? Greater than 50% was spent in counseling and coordination of care with the patient ? ? ?No orders of the defined types were placed in this  encounter. ? ? ? ?Signed, ?Esmond Plants, M.D., Ph.D. ?02/15/2022  ?[place] ?[phone] ? ? ?

## 2022-02-16 ENCOUNTER — Ambulatory Visit: Payer: Medicaid Other | Admitting: Cardiovascular Disease

## 2022-02-16 DIAGNOSIS — I25118 Atherosclerotic heart disease of native coronary artery with other forms of angina pectoris: Secondary | ICD-10-CM

## 2022-02-16 DIAGNOSIS — E7800 Pure hypercholesterolemia, unspecified: Secondary | ICD-10-CM

## 2022-02-16 DIAGNOSIS — I739 Peripheral vascular disease, unspecified: Secondary | ICD-10-CM

## 2022-02-16 DIAGNOSIS — I700 Atherosclerosis of aorta: Secondary | ICD-10-CM

## 2022-02-16 DIAGNOSIS — F17200 Nicotine dependence, unspecified, uncomplicated: Secondary | ICD-10-CM

## 2022-02-19 ENCOUNTER — Encounter: Payer: Self-pay | Admitting: Cardiovascular Disease

## 2022-02-23 ENCOUNTER — Other Ambulatory Visit: Payer: Self-pay | Admitting: Cardiovascular Disease

## 2022-03-05 ENCOUNTER — Ambulatory Visit: Payer: Medicaid Other | Admitting: Cardiovascular Disease

## 2022-03-30 NOTE — Progress Notes (Deleted)
Cardiology Office Note  Date:  03/30/2022   ID:  Patricia Rollins, DOB Nov 19, 1960, MRN [id number]  PCP:  Remi Haggard, FNP   No chief complaint on file.   HPI:  Patricia Rollins is a pleasant 61 year old woman with  long history of smoking who continues to smoke one pack per day,  In nic inhaler COPD, CAD  PAD noted on CT scan of the chest and abdomen,  hyperlipidemia,  who for follow-up of her coronary disease and peripheral arterial disease.  Last seen by myself in clinic December 2021   Numerous issues to discuss today Right wrist swelling, bruising Images reviewed from her smart phone  When she wakes up the morning reports her left arm is numb, difficulty getting it moving in the morning "cant get arm to work", after a while she is able to then move it better  Had back injection, right side back Felt worse since then, more of a general malaise  "8-9 doctors" Tired of doctors  Continues to have GI issues Stomach pain, pain relieved with bowel movement, Belching Pressure between shoulder blades when eating sometimes  Not on PPI  Previously recommended to increase her lovastatin up to 40, she continues to take 20  No recent lab work  EKG personally reviewed by myself on todays visit Normal sinus rhythm rate 78 bpm nonspecific T wave abnormality   continues to smoke, She reports that she started smoking age 76  Tried chantix, had vivid dreams,    Other past medical history reviewed CT scan chest and abdomen/pelvis performed in September and again in October 2017  CT scan  results discussed with her again today   mild to moderate coronary calcification in the mid LAD, proximal RCA Also has mild to moderate aortic arch calcification, moderate distal descending aorta disease, mild to moderate common iliac and bilateral femoral arterial disease   PMH:   has a past medical history of Anxiety, Arthritis, CAD (coronary artery disease), Chronic pancreatitis ([place]),  Colon polyp (05/08/2017), Depression, Fibromyalgia, GERD (gastroesophageal reflux disease), Hematuria (06/21/2016), HLD (hyperlipidemia), IBS (irritable bowel syndrome), and Neuromuscular disorder ([place]).  PSH:    Past Surgical History:  Procedure Laterality Date   ABDOMINAL HYSTERECTOMY     APPENDECTOMY     COLONOSCOPY WITH PROPOFOL N/A 05/08/2017   Procedure: COLONOSCOPY WITH PROPOFOL;  Surgeon: Robert Bellow, MD;  Location: [facility] ENDOSCOPY;  Service: Endoscopy;  Laterality: N/A;   ESOPHAGOGASTRODUODENOSCOPY (EGD) WITH PROPOFOL N/A 05/08/2017   Procedure: ESOPHAGOGASTRODUODENOSCOPY (EGD) WITH PROPOFOL;  Surgeon: Robert Bellow, MD;  Location: [facility] ENDOSCOPY;  Service: Endoscopy;  Laterality: N/A;   OOPHORECTOMY Right    TONSILLECTOMY AND ADENOIDECTOMY Bilateral     Current Outpatient Medications  Medication Sig Dispense Refill   ALPRAZolam (XANAX) 1 MG tablet Take 1 mg by mouth 3 (three) times daily.      aspirin EC 81 MG tablet Take 81 mg by mouth daily.      Cholecalciferol (VITAMIN D3) 25 MCG (1000 UT) CAPS Take 1,000 Units by mouth daily.      cimetidine (TAGAMET) 200 MG tablet Take 200 mg by mouth 2 (two) times daily.     conjugated estrogens (PREMARIN) vaginal cream Place vaginally 2 (two) times a week. 30 g 3   estrogens, conjugated, (PREMARIN) 0.625 MG tablet Take 1 tablet (0.625 mg total) by mouth daily. 90 tablet 3   gabapentin (NEURONTIN) 300 MG capsule Take 300 mg by mouth at bedtime.     HYDROcodone-acetaminophen (NORCO) 10-325  MG tablet Take 0.5 tablets by mouth 3 (three) times daily.     loratadine (CLARITIN) 10 MG tablet Take 10 mg by mouth daily.     lovastatin (MEVACOR) 20 MG tablet Take 1 tablet (20 mg total) by mouth at bedtime. Further refills to be authorized at appointment. Thank you! 30 tablet 0   NYAMYC powder APPLY SMALL AMOUNT TOPICALLY EVERY DAY AS NEEDED. 60 g 1   nystatin (MYCOSTATIN/NYSTOP) powder Apply 1 application topically 2 (two) times daily. 60 g  1   predniSONE (STERAPRED UNI-PAK 21 TAB) 10 MG (21) TBPK tablet As directed x 6 days 21 tablet 0   tiZANidine (ZANAFLEX) 4 MG tablet Take 4 mg by mouth in the morning and at bedtime.     No current facility-administered medications for this visit.     Allergies:   Fluoxetine, Sulfa antibiotics, and Sulfasalazine   Social History:  The patient  reports that she has been smoking cigarettes. She has been smoking an average of .25 packs per day. She has never used smokeless tobacco. She reports that she does not drink alcohol and does not use drugs.   Family History:   family history includes Ovarian cancer in her mother; Pancreatic cancer in her mother.    Review of Systems: Review of Systems  Constitutional: Negative.   HENT: Negative.    Respiratory: Negative.    Cardiovascular: Negative.   Gastrointestinal: Negative.   Musculoskeletal:  Positive for back pain.  Neurological: Negative.   Psychiatric/Behavioral: Negative.    All other systems reviewed and are negative.  PHYSICAL EXAM: VS:  There were no vitals taken for this visit. , BMI There is no height or weight on file to calculate BMI. Constitutional:  oriented to person, place, and time. No distress.  HENT:  Head: Grossly normal Eyes:  no discharge. No scleral icterus.  Neck: No JVD, no carotid bruits  Cardiovascular: Regular rate and rhythm, no murmurs appreciated Pulmonary/Chest: Clear to auscultation bilaterally, no wheezes or rails Abdominal: Soft.  no distension.  no tenderness.  Musculoskeletal: Normal range of motion Neurological:  normal muscle tone. Coordination normal. No atrophy Skin: Skin warm and dry Psychiatric: normal affect, pleasant  Recent Labs: 06/28/2021: ALT 12; BUN 10; Creatinine, Ser 0.76; Hemoglobin 14.2; Platelets 370; Potassium 4.4; Sodium 142    Lipid Panel Lab Results  Component Value Date   CHOL 224 (H) 06/28/2021   HDL 76 06/28/2021   LDLCALC 118 (H) 06/28/2021   TRIG 175 (H)  06/28/2021      Wt Readings from Last 3 Encounters:  06/28/21 197 lb 8 oz (89.6 kg)  10/24/20 201 lb (91.2 kg)  03/01/20 200 lb 12.8 oz (91.1 kg)      ASSESSMENT AND PLAN:  Smoker Long discussion concerning smoking cessation She does not want Chantix or other modalities Does not seem to have any desire to quit smoking right now  Coronary artery disease involving coronary bypass graft of native heart without angina pectoris Calcium on CT scan Recommend smoking cessation, stay on lovastatin  Aortic atherosclerosis (HCC)  mild to moderate in nature,  On lovastatin Recommended she quit smoking  PAD (peripheral artery disease) (HCC) Mild to moderate disease of the common iliac, femoral arteries bilaterally, distal descending aorta Recommended smoking cessation  Statin lovastatin  Pure hypercholesterolemia On lovastatin 20    Total encounter time more than 25 minutes  Greater than 50% was spent in counseling and coordination of care with the patient   No orders of  the defined types were placed in this encounter.    Signed, Esmond Plants, M.D., Ph.D. 03/30/2022  [place], [place]

## 2022-04-02 ENCOUNTER — Ambulatory Visit: Payer: Medicaid Other | Admitting: Cardiovascular Disease

## 2022-04-02 DIAGNOSIS — E7800 Pure hypercholesterolemia, unspecified: Secondary | ICD-10-CM

## 2022-04-02 DIAGNOSIS — N280 Ischemia and infarction of kidney: Secondary | ICD-10-CM

## 2022-04-02 DIAGNOSIS — I739 Peripheral vascular disease, unspecified: Secondary | ICD-10-CM

## 2022-04-02 DIAGNOSIS — I25118 Atherosclerotic heart disease of native coronary artery with other forms of angina pectoris: Secondary | ICD-10-CM

## 2022-04-02 DIAGNOSIS — I700 Atherosclerosis of aorta: Secondary | ICD-10-CM

## 2022-04-05 NOTE — Progress Notes (Unsigned)
Office Visit    Patient Name: Patricia Rollins Date of Encounter: 04/05/2022  Primary Care Provider:  Remi Haggard, FNP Primary Cardiologist:  None Primary Electrophysiologist: None  Chief Complaint    Patricia Rollins is a 61 y.o. female with PMH of COPD, tobacco abuse, PAD, CAD, HLD, carotid artery disease aortic atherosclerosis, GERD, anxiety, depression presents today for overdue 1 year follow-up of coronary artery disease. Past Medical History    Past Medical History:  Diagnosis Date   Anxiety    Arthritis    patient has bilateral bursitis in hips   CAD (coronary artery disease)    Chronic pancreatitis ([place])    CT of Abd/Pelvis 12-06-17   Colon polyp 05/08/2017   Dr Bary Castilla   Depression    Fibromyalgia    GERD (gastroesophageal reflux disease)    Hematuria 06/21/2016   HLD (hyperlipidemia)    IBS (irritable bowel syndrome)    Neuromuscular disorder ([place])    Past Surgical History:  Procedure Laterality Date   ABDOMINAL HYSTERECTOMY     APPENDECTOMY     COLONOSCOPY WITH PROPOFOL N/A 05/08/2017   Procedure: COLONOSCOPY WITH PROPOFOL;  Surgeon: Robert Bellow, MD;  Location: [facility] END[facility]COPY;  Service: Endoscopy;  Laterality: N/A;   ESOPHAGOGASTRODUODENOSCOPY (EGD) WITH PROPOFOL N/A 05/08/2017   Procedure: ESOPHAGOGASTRODUODENOSCOPY (EGD) WITH PROPOFOL;  Surgeon: Robert Bellow, MD;  Location: [facility] END[facility];  Service: Endoscopy;  Laterality: N/A;   OOPHORECTOMY Right    TONSILLECTOMY AND ADENOIDECTOMY Bilateral     Allergies  Allergies  Allergen Reactions   Fluoxetine Other (See Comments)    Confusion    Sulfa Antibiotics Nausea Only   Sulfasalazine Nausea Only    History of Present Illness    Patricia Rollins has PMH as noted above presents today for overdue 1 year follow-up with Dr. Rockey Situ.  Patient completed chest CT in 08/2016 revealing calcification in LAD and RCA with moderate thoracic atherosclerosis.  She was referred to Dr. Rockey Situ in 09/2016  and was started on Crestor 5 mg however experienced myalgias and was switched to lovastatin.  She presented to [facility] ED on 01/2017 with complaints of pain and pressure between shoulder blades and vaginal pain.  Chest x-ray was unremarkable and troponins were negative, patient stated pain resolved spontaneously.  Patient was seen for initial consult on 02/06/2017 for [facility].patient was ordered a stress echo to evaluate heart structure however did not have this completed.  She was seen by Dr. Nehemiah Massed on 02/2017 with complaints of anginal equivalent pain for 4 months with SOB.  Exercise stress test was ordered however cannot find results or copy in chart.  CT scan of abdomen and pelvis revealed diffuse scattered atherosclerosis without significant stenosis of aorta, iliac arteries or common femoral arteries.  Since last being seen in the office patient reports***.  Patient denies chest pain, palpitations, dyspnea, PND, orthopnea, nausea, vomiting, dizziness, syncope, edema, weight gain, or early satiety.    ***Notes:  Home Medications    Current Outpatient Medications  Medication Sig Dispense Refill   ALPRAZolam (XANAX) 1 MG tablet Take 1 mg by mouth 3 (three) times daily.      aspirin EC 81 MG tablet Take 81 mg by mouth daily.      Cholecalciferol (VITAMIN D3) 25 MCG (1000 UT) CAPS Take 1,000 Units by mouth daily.      cimetidine (TAGAMET) 200 MG tablet Take 200 mg by mouth 2 (two) times daily.     conjugated  estrogens (PREMARIN) vaginal cream Place vaginally 2 (two) times a week. 30 g 3   estrogens, conjugated, (PREMARIN) 0.625 MG tablet Take 1 tablet (0.625 mg total) by mouth daily. 90 tablet 3   gabapentin (NEURONTIN) 300 MG capsule Take 300 mg by mouth at bedtime.     HYDROcodone-acetaminophen (NORCO) 10-325 MG tablet Take 0.5 tablets by mouth 3 (three) times daily.     loratadine (CLARITIN) 10 MG tablet Take 10 mg by mouth daily.     lovastatin (MEVACOR) 20 MG tablet Take 1 tablet  (20 mg total) by mouth at bedtime. Further refills to be authorized at appointment. Thank you! 30 tablet 0   NYAMYC powder APPLY SMALL AMOUNT TOPICALLY EVERY DAY AS NEEDED. 60 g 1   nystatin (MYCOSTATIN/NYSTOP) powder Apply 1 application topically 2 (two) times daily. 60 g 1   predniSONE (STERAPRED UNI-PAK 21 TAB) 10 MG (21) TBPK tablet As directed x 6 days 21 tablet 0   tiZANidine (ZANAFLEX) 4 MG tablet Take 4 mg by mouth in the morning and at bedtime.     No current facility-administered medications for this visit.     Review of Systems  Please see the history of present illness.    (+)*** (+)***  All other systems reviewed and are otherwise negative except as noted above.  Physical Exam    Wt Readings from Last 3 Encounters:  06/28/21 197 lb 8 oz (89.6 kg)  10/24/20 201 lb (91.2 kg)  03/01/20 200 lb 12.8 oz (91.1 kg)   RK:YHCWC were no vitals filed for this visit.,There is no height or weight on file to calculate BMI.  Constitutional:      Appearance: Healthy appearance. Not in distress.  Neck:     Vascular: JVD normal.  Pulmonary:     Effort: Pulmonary effort is normal.     Breath sounds: No wheezing. No rales. Diminished in the bases Cardiovascular:     Normal rate. Regular rhythm. Normal S1. Normal S2.      Murmurs: There is no murmur.  Edema:    Peripheral edema absent.  Abdominal:     Palpations: Abdomen is soft non tender. There is no hepatomegaly.  Skin:    General: Skin is warm and dry.  Neurological:     General: No focal deficit present.     Mental Status: Alert and oriented to person, place and time.     Cranial Nerves: Cranial nerves are intact.  EKG/LABS/Other Studies Reviewed    ECG personally reviewed by me today - ***  Risk Assessment/Calculations:   {Does this patient have ATRIAL FIBRILLATION?:[phone]}        Lab Results  Component Value Date   WBC 7.4 06/28/2021   HGB 14.2 06/28/2021   HCT 42.6 06/28/2021   MCV 94 06/28/2021   PLT  370 06/28/2021   Lab Results  Component Value Date   CREATININE 0.76 06/28/2021   BUN 10 06/28/2021   NA 142 06/28/2021   K 4.4 06/28/2021   CL 103 06/28/2021   CO2 24 06/28/2021   Lab Results  Component Value Date   ALT 12 06/28/2021   AST 15 06/28/2021   ALKPHOS 97 06/28/2021   BILITOT <0.2 06/28/2021   Lab Results  Component Value Date   CHOL 224 (H) 06/28/2021   HDL 76 06/28/2021   LDLCALC 118 (H) 06/28/2021   TRIG 175 (H) 06/28/2021   CHOLHDL 2.9 06/28/2021    Lab Results  Component Value Date   HGBA1C 5.6  06/28/2021    Assessment & Plan    1.  Aortic atherosclerosis  2.  Coronary artery disease  3.  Pure hypercholesteremia  4.  Peripheral artery disease      Disposition: Follow-up with None or APP in *** months {Are you ordering a CV Procedure (e.g. stress test, cath, DCCV, TEE, etc)?   Press F2        :[id number]}   Medication Adjustments/Labs and Tests Ordered: Current medicines are reviewed at length with the patient today.  Concerns regarding medicines are outlined above.   Signed, Mable Fill, Marissa Nestle, NP 04/05/2022, 7:20 AM [place]

## 2022-04-06 ENCOUNTER — Ambulatory Visit: Payer: Medicaid Other | Admitting: Nurse Practitioner

## 2022-04-06 DIAGNOSIS — I700 Atherosclerosis of aorta: Secondary | ICD-10-CM

## 2022-04-06 DIAGNOSIS — E7800 Pure hypercholesterolemia, unspecified: Secondary | ICD-10-CM

## 2022-04-06 DIAGNOSIS — I739 Peripheral vascular disease, unspecified: Secondary | ICD-10-CM

## 2022-04-06 DIAGNOSIS — I25118 Atherosclerotic heart disease of native coronary artery with other forms of angina pectoris: Secondary | ICD-10-CM

## 2022-06-11 NOTE — Progress Notes (Unsigned)
Cardiology Office Note  Date:  06/12/2022   ID:  [name], Patricia Rollins 27-Aug-1961, MRN [id number]  PCP:  Remi Haggard, FNP   Chief Complaint  Patient presents with   12 month follow up     Patient c/o LE edema at times. Medications reviewed by the patient verbally.     HPI:  Patricia Rollins is a 61 year old woman with  Active and longtime smoker, COPD, CAD  PAD noted on CT scan of the chest and abdomen,  hyperlipidemia,  Chronic low back pain who for follow-up of her coronary disease and peripheral arterial disease.  Last seen by myself in clinic December 2021 Reports having worsening Chronic leg pain, "pinched nerve" Reports periodically needing prednisone for flareups in her back [facility]en by [facility], back injections x2, did not seem to help symptoms Xray: Multilevel degenerative changes are noted of the lumbar spine. MRI 2019 reviewed showing moderate spinal cord stenosis  Smoking 1/2 ppd  Out of lovastatin, Remodeling her house, limited secondary to chronic back pain  Lab work reviewed A1C 5.6 Cholesterol greater than 200  EKG personally reviewed by myself on todays visit Normal sinus rhythm rate 92 bpm nonspecific T wave abnormality  Other past medical history reviewed started smoking age 43  Tried chantix, had vivid dreams,   CT scan chest and abdomen/pelvis performed in September and again in October 2017  CT scan  results discussed with her again today   mild to moderate coronary calcification in the mid LAD, proximal RCA Also has mild to moderate aortic arch calcification, moderate distal descending aorta disease, mild to moderate common iliac and bilateral femoral arterial disease   PMH:   has a past medical history of Anxiety, Arthritis, CAD (coronary artery disease), Chronic pancreatitis (Patricia Rollins), Colon polyp (05/08/2017), Depression, Fibromyalgia, GERD (gastroesophageal reflux disease), Hematuria (06/21/2016), HLD (hyperlipidemia), IBS (irritable bowel  syndrome), and Neuromuscular disorder (Patricia Rollins).  PSH:    Past Surgical History:  Procedure Laterality Date   ABDOMINAL HYSTERECTOMY     APPENDECTOMY     COLONOSCOPY WITH PROPOFOL N/A 05/08/2017   Procedure: COLONOSCOPY WITH PROPOFOL;  Surgeon: Patricia Bellow, MD;  Location: [facility] END[facility]COPY;  Service: Endoscopy;  Laterality: N/A;   ESOPHAGOGASTRODUODENOSCOPY (EGD) WITH PROPOFOL N/A 05/08/2017   Procedure: ESOPHAGOGASTRODUODENOSCOPY (EGD) WITH PROPOFOL;  Surgeon: Patricia Bellow, MD;  Location: [facility] END[facility]COPY;  Service: Endoscopy;  Laterality: N/A;   OOPHORECTOMY Right    TONSILLECTOMY AND ADENOIDECTOMY Bilateral     Current Outpatient Medications  Medication Sig Dispense Refill   ALPRAZolam (XANAX) 1 MG tablet Take 1 mg by mouth 3 (three) times daily.      amphetamine-dextroamphetamine (ADDERALL) 10 MG tablet Take 10 mg by mouth 2 (two) times daily.     aspirin EC 81 MG tablet Take 81 mg by mouth daily.      Cholecalciferol (VITAMIN D3) 25 MCG (1000 UT) CAPS Take 1,000 Units by mouth daily.      cimetidine (TAGAMET) 200 MG tablet Take 200 mg by mouth 2 (two) times daily.     conjugated estrogens (PREMARIN) vaginal cream Place vaginally 2 (two) times a week. 30 g 3   estrogens, conjugated, (PREMARIN) 0.625 MG tablet Take 1 tablet (0.625 mg total) by mouth daily. 90 tablet 3   gabapentin (NEURONTIN) 300 MG capsule Take 300 mg by mouth at bedtime.     loratadine (CLARITIN) 10 MG tablet Take 10 mg by mouth daily.     lovastatin (MEVACOR) 20 MG tablet Take 1 tablet (  20 mg total) by mouth at bedtime. Further refills to be authorized at appointment. Thank you! 30 tablet 0   NYAMYC powder APPLY SMALL AMOUNT TOPICALLY EVERY DAY AS NEEDED. 60 g 1   nystatin (MYCOSTATIN/NYSTOP) powder Apply 1 application topically 2 (two) times daily. 60 g 1   tiZANidine (ZANAFLEX) 4 MG tablet Take 4 mg by mouth in the morning and at bedtime.     HYDROcodone-acetaminophen (NORCO) 10-325 MG tablet Take 0.5 tablets  by mouth 3 (three) times daily. (Patient not taking: Reported on 06/12/2022)     predniSONE (STERAPRED UNI-PAK 21 TAB) 10 MG (21) TBPK tablet As directed x 6 days (Patient not taking: Reported on 06/12/2022) 21 tablet 0   No current facility-administered medications for this visit.    Allergies:   Fluoxetine, Other, Sulfa antibiotics, and Sulfasalazine   Social History:  The patient  reports that she has been smoking cigarettes. She has been smoking an average of .25 packs per day. She has never used smokeless tobacco. She reports that she does not drink alcohol and does not use drugs.   Family History:   family history includes Ovarian cancer in her mother; Pancreatic cancer in her mother.    Review of Systems: Review of Systems  Constitutional: Negative.   HENT: Negative.    Respiratory: Negative.    Cardiovascular: Negative.   Gastrointestinal: Negative.   Musculoskeletal:  Positive for back pain.  Neurological: Negative.   Psychiatric/Behavioral: Negative.    All other systems reviewed and are negative.   PHYSICAL EXAM: VS:  BP (!) 128/90 (BP Location: Left Arm, Patient Position: Sitting, Cuff Size: Normal)   Pulse 92   Ht '5\' 5"'$  (1.651 m)   Wt 180 lb (81.6 kg)   SpO2 97%   BMI 29.95 kg/m  , BMI Body mass index is 29.95 kg/m. Constitutional:  oriented to person, place, and time. No distress.  HENT:  Head: Grossly normal Eyes:  no discharge. No scleral icterus.  Neck: No JVD, no carotid bruits  Cardiovascular: Regular rate and rhythm, no murmurs appreciated Pulmonary/Chest: Clear to auscultation bilaterally, no wheezes or rails Abdominal: Soft.  no distension.  no tenderness.  Musculoskeletal: Normal range of motion Neurological:  normal muscle tone. Coordination normal. No atrophy Skin: Skin warm and dry Psychiatric: normal affect, pleasant  Recent Labs: 06/28/2021: ALT 12; BUN 10; Creatinine, Ser 0.76; Hemoglobin 14.2; Platelets 370; Potassium 4.4; Sodium 142     Lipid Panel Lab Results  Component Value Date   CHOL 224 (H) 06/28/2021   HDL 76 06/28/2021   LDLCALC 118 (H) 06/28/2021   TRIG 175 (H) 06/28/2021      Wt Readings from Last 3 Encounters:  06/12/22 180 lb (81.6 kg)  06/28/21 197 lb 8 oz (89.6 kg)  10/24/20 201 lb (91.2 kg)     ASSESSMENT AND PLAN:  Smoker We have encouraged her to continue to work on weaning her cigarettes and smoking cessation. She will continue to work on this and does not want any assistance with chantix.    Coronary artery disease involving coronary bypass graft of native heart without angina pectoris Calcium on CT scan Refilled lovastatin, goal total cholesterol less than 150, denies anginal symptoms  Aortic atherosclerosis (HCC)  mild to moderate in nature,  On lovastatin Recommended she quit smoking  PAD (peripheral artery disease) (HCC) Mild to moderate disease of the common iliac, femoral arteries bilaterally, distal descending aorta Recommended smoking cessation  Continue lovastatin Lower extremity arterial Doppler ordered as  she is concerned about claudication symptoms  Acute on chronic low back pain Reports having sciatica/numbness down her legs DJD on x-ray, moderate stenosis noted 2019 on MRI Recent cortisone shots with [facility] with no improvement of her symptoms Recommend she have second opinion with neurosurgery  Pure hypercholesterolemia We have refilled lovastatin 40 mg daily    Total encounter time more than 25 minutes  Greater than 50% was spent in counseling and coordination of care with the patient   No orders of the defined types were placed in this encounter.    Signed, Esmond Plants, M.D., Ph.D. 06/12/2022  [place], [place]

## 2022-06-12 ENCOUNTER — Ambulatory Visit (INDEPENDENT_AMBULATORY_CARE_PROVIDER_SITE_OTHER): Payer: Medicaid Other | Admitting: Cardiovascular Disease

## 2022-06-12 ENCOUNTER — Encounter: Payer: Self-pay | Admitting: Cardiovascular Disease

## 2022-06-12 VITALS — BP 128/90 | HR 92 | Ht 65.0 in | Wt 180.0 lb

## 2022-06-12 DIAGNOSIS — I25118 Atherosclerotic heart disease of native coronary artery with other forms of angina pectoris: Secondary | ICD-10-CM

## 2022-06-12 DIAGNOSIS — I700 Atherosclerosis of aorta: Secondary | ICD-10-CM

## 2022-06-12 DIAGNOSIS — N280 Ischemia and infarction of kidney: Secondary | ICD-10-CM | POA: Diagnosis not present

## 2022-06-12 DIAGNOSIS — I739 Peripheral vascular disease, unspecified: Secondary | ICD-10-CM | POA: Diagnosis not present

## 2022-06-12 DIAGNOSIS — E7800 Pure hypercholesterolemia, unspecified: Secondary | ICD-10-CM

## 2022-06-12 MED ORDER — LOVASTATIN 40 MG PO TABS: 40.0000 mg | ORAL_TABLET | Freq: Every day | ORAL | 3 refills | Status: AC

## 2022-06-12 NOTE — Patient Instructions (Addendum)
Phone for Dr. Arnoldo Morale,  Back doctor [phone] [place] OFFICE  Medication Instructions:  Please continue lovastatin  If you need a refill on your cardiac medications before your next appointment, please call your pharmacy.   Lab work: No new labs needed  Testing/Procedures:  Your physician has requested that you have a lower extremity arterial duplex. This test is an ultrasound of the arteries in the legs or arms. It looks at arterial blood flow in the legs. Allow one hour for Lower and Upper Arterial scans. There are no restrictions or special instructions   Follow-Up: At [facility], you and your health needs are our priority.  As part of our continuing mission to provide you with exceptional heart care, we have created designated Provider Care Teams.  These Care Teams include your primary Cardiologist (physician) and Advanced Practice Providers (APPs -  Physician Assistants and Nurse Practitioners) who all work together to provide you with the care you need, when you need it.  You will need a follow up appointment in 12 months  Providers on your designated Care Team:   Murray Hodgkins, NP Christell Faith, PA-C Cadence Kathlen M[place], [place]  COVID-19 Vaccine Information can be found at: [email or web address] For questions related to vaccine distribution or appointments, please email [email or web address] or call [phone].

## 2022-06-14 ENCOUNTER — Encounter: Payer: Self-pay | Admitting: Obstetrics and Gynecology

## 2022-06-14 ENCOUNTER — Encounter: Payer: Self-pay | Admitting: Cardiovascular Disease

## 2022-06-25 ENCOUNTER — Other Ambulatory Visit: Payer: Self-pay | Admitting: Obstetrics and Gynecology

## 2022-06-25 ENCOUNTER — Telehealth: Payer: Self-pay | Admitting: Obstetrics and Gynecology

## 2022-06-25 MED ORDER — NYSTATIN 100000 UNIT/GM EX POWD: 1.0000 | Freq: Two times a day (BID) | CUTANEOUS | 1 refills | Status: AC

## 2022-06-25 NOTE — Telephone Encounter (Signed)
Patient is needing to get a refill on her premarin pills and nystatin powder. Would like meds called to [address].   CB# [phone]

## 2022-06-25 NOTE — Telephone Encounter (Signed)
Yes, she needs an appointment.

## 2022-06-27 ENCOUNTER — Encounter: Payer: Self-pay | Admitting: Obstetrics and Gynecology

## 2022-06-27 ENCOUNTER — Ambulatory Visit (INDEPENDENT_AMBULATORY_CARE_PROVIDER_SITE_OTHER): Payer: Medicaid Other | Admitting: Obstetrics and Gynecology

## 2022-06-27 VITALS — BP 116/74 | HR 98 | Resp 16 | Ht 65.0 in | Wt 176.8 lb

## 2022-06-27 DIAGNOSIS — N952 Postmenopausal atrophic vaginitis: Secondary | ICD-10-CM | POA: Diagnosis not present

## 2022-06-27 DIAGNOSIS — R10A2 Flank pain, left side: Secondary | ICD-10-CM

## 2022-06-27 DIAGNOSIS — R109 Unspecified abdominal pain: Secondary | ICD-10-CM

## 2022-06-27 DIAGNOSIS — Z79890 Hormone replacement therapy (postmenopausal): Secondary | ICD-10-CM

## 2022-06-27 LAB — POCT URINALYSIS DIPSTICK
Bilirubin, UA: NEGATIVE
Blood, UA: NEGATIVE
Glucose, UA: NEGATIVE
Ketones, UA: NEGATIVE
Leukocytes, UA: NEGATIVE
Nitrite, UA: NEGATIVE
Protein, UA: POSITIVE — AB
Spec Grav, UA: 1.015 (ref 1.010–1.025)
Urobilinogen, UA: 0.2 E.U./dL
pH, UA: 6 (ref 5.0–8.0)

## 2022-06-27 MED ORDER — ESTROGENS CONJUGATED 0.625 MG PO TABS: 0.6250 mg | ORAL_TABLET | Freq: Every day | ORAL | 3 refills | Status: DC

## 2022-06-27 NOTE — Progress Notes (Signed)
    GYNECOLOGY PROGRESS NOTE  Subjective:    Patient ID: Patricia Rollins, female    DOB: [birth year], 61 y.o.   MRN: [id number]  HPI  Patient is a 61 y.o. [id number] female who presents for medication refills. She has been taking Premarin since 2017 for vaginal atrophy.  She needs a refill on her medication. Notes that it takes the edge off, helps with her mood and irritability, and keeps hot flushes at minimum. She also is having some left flank pain x 2 months that comes and goes, some urinary urgency and weak urine stream.  Still notes intermittent rash under breast, still taking Nystatin powder. Needs refill on that. Does note that it flares up after consuming dairy or probiotics.   The following portions of the patient's history were reviewed and updated as appropriate: allergies, current medications, past family history, past medical history, past social history, past surgical history, and problem list.  Review of Systems Pertinent items noted in HPI and remainder of comprehensive ROS otherwise negative.   Objective:   Blood pressure 116/74, pulse 98, resp. rate 16, height '5\' 5"'$  (1.651 m), weight 176 lb 12.8 oz (80.2 kg). Body mass index is 29.42 kg/m. General appearance: alert and no distress Abdomen: soft, non-tender; bowel sounds normal; no masses,  no organomegaly Pelvic: deferred (declines).  Extremities: extremities normal, atraumatic, no cyanosis or edema Neurologic: Grossly normal   Labs:  Results for orders placed or performed in visit on 06/27/22  POCT Urinalysis Dipstick  Result Value Ref Range   Color, UA     Clarity, UA     Glucose, UA Negative Negative   Bilirubin, UA Negative    Ketones, UA Negative    Spec Grav, UA 1.015 1.010 - 1.025   Blood, UA Negative    pH, UA 6.0 5.0 - 8.0   Protein, UA Positive (A) Negative   Urobilinogen, UA 0.2 0.2 or 1.0 E.U./dL   Nitrite, UA Negative    Leukocytes, UA Negative Negative   Appearance     Odor      Assessment:    1. Left flank pain   2. Vaginal atrophy   3. Post-menopause on HRT (hormone replacement therapy)      Plan:   UA today with no significant findings. Advised that if no improvement, may need to be seen by Urology for further evaluation Reports hot flushes have returned since being out of her medication for a few days.  Will refill today.  Vaginal atrophy managed with estrogen cream.   Return to clinic for any scheduled appointments or for any gynecologic concerns as needed. Scheduled for annual exam in next 3 months.    Rubie Maid, MD [facility]

## 2022-07-10 ENCOUNTER — Other Ambulatory Visit: Payer: Self-pay | Admitting: Cardiovascular Disease

## 2022-07-10 DIAGNOSIS — I739 Peripheral vascular disease, unspecified: Secondary | ICD-10-CM

## 2022-07-17 ENCOUNTER — Ambulatory Visit: Payer: Medicaid Other

## 2022-07-17 ENCOUNTER — Encounter: Payer: Self-pay | Admitting: Cardiovascular Disease

## 2022-08-22 ENCOUNTER — Ambulatory Visit: Payer: Medicaid Other

## 2022-10-16 ENCOUNTER — Telehealth: Payer: Self-pay | Admitting: Cardiovascular Disease

## 2022-10-16 NOTE — Telephone Encounter (Signed)
Returned the call to the patient. She was calling to confirm her appointment for the LEA tomorrow.

## 2022-10-16 NOTE — Telephone Encounter (Signed)
Patient stated she would like a call back to discuss questions regarding the test being done tomorrow.

## 2022-10-17 ENCOUNTER — Ambulatory Visit: Payer: Medicaid Other | Attending: Cardiovascular Disease

## 2022-11-03 ENCOUNTER — Telehealth: Payer: Medicaid Other | Admitting: Physician Assistant

## 2022-11-03 DIAGNOSIS — G8929 Other chronic pain: Secondary | ICD-10-CM

## 2022-11-03 DIAGNOSIS — M5441 Lumbago with sciatica, right side: Secondary | ICD-10-CM | POA: Diagnosis not present

## 2022-11-03 MED ORDER — METHYLPREDNISOLONE 4 MG PO TBPK: ORAL_TABLET | ORAL | 0 refills | Status: AC

## 2022-11-03 NOTE — Progress Notes (Signed)
Virtual Visit Consent   Patricia Rollins, you are scheduled for a virtual visit with a [facility] provider today. Just as with appointments in the office, your consent must be obtained to participate. Your consent will be active for this visit and any virtual visit you may have with one of our providers in the next 365 days. If you have a MyChart account, a copy of this consent can be sent to you electronically.  As this is a virtual visit, video technology does not allow for your provider to perform a traditional examination. This may limit your provider's ability to fully assess your condition. If your provider identifies any concerns that need to be evaluated in person or the need to arrange testing (such as labs, EKG, etc.), we will make arrangements to do so. Although advances in technology are sophisticated, we cannot ensure that it will always work on either your end or our end. If the connection with a video visit is poor, the visit may have to be switched to a telephone visit. With either a video or telephone visit, we are not always able to ensure that we have a secure connection.  By engaging in this virtual visit, you consent to the provision of healthcare and authorize for your insurance to be billed (if applicable) for the services provided during this visit. Depending on your insurance coverage, you may receive a charge related to this service.  I need to obtain your verbal consent now. Are you willing to proceed with your visit today? Patricia Rollins has provided verbal consent on 11/03/2022 for a virtual visit (video or telephone). Mar Daring, PA-C  Date: 11/03/2022 2:18 PM  Virtual Visit via Video Note   I, Mar Daring, connected with  Patricia Rollins  ([id number], 07/05/1961) on 11/03/22 at  2:15 PM EST by a video-enabled telemedicine application and verified that I am speaking with the correct person using two identifiers.  Location: Patient: Virtual Visit Location  Patient: Home Provider: Virtual Visit Location Provider: Home Office   I discussed the limitations of evaluation and management by telemedicine and the availability of in person appointments. The patient expressed understanding and agreed to proceed.    History of Present Illness: Patricia Rollins is a 61 y.o. who identifies as a female who was assigned female at birth, and is being seen today for back pain with right sided sciatica. Acute on chronic issue.  HPI: Back Pain This is a recurrent problem. The problem occurs constantly. The problem has been gradually worsening since onset. The pain is present in the lumbar spine and gluteal. The quality of the pain is described as shooting. The pain radiates to the right foot, right knee and right thigh. The pain is severe. The pain is The same all the time. The symptoms are aggravated by standing and position. Stiffness is present All day. Associated symptoms include leg pain, numbness and tingling. Pertinent negatives include no bladder incontinence, bowel incontinence, paresis, paresthesias, pelvic pain, perianal numbness or weakness. Treatments tried: tizanadine, aspirin, ibuprofen yesterday, gabapentin at bedtime. The treatment provided no relief.    Having to walk with her 4 prong cane to help ambulate.   Problems:  Patient Active Problem List   Diagnosis Date Noted   No-show for appointment 09/27/2020   Incisional hernia, without obstruction or gangrene 01/07/2020   Vaginal atrophy 06/28/2017   Coronary artery disease involving native coronary artery of native heart 03/06/2017   Stable angina pectoris 03/06/2017  Renal infarct ([place]) 02/26/2017   Abdominal pain 02/26/2017   Midline thoracic back pain 02/01/2017   LUQ abdominal tenderness 01/03/2017   Renal lesion 01/03/2017   Spinal stenosis of lumbar region 12/25/2016   Epigastric abdominal pain 11/21/2016   Constipation 10/23/2016   Lipoma of chest wall 10/23/2016   Coronary artery  disease involving coronary bypass graft of native heart without angina pectoris 09/13/2016   Aortic atherosclerosis ([place]) 09/13/2016   PAD (peripheral artery disease) ([place]) 09/13/2016   Pure hypercholesterolemia 09/13/2016   Marijuana use 08/09/2016   Chest pain of uncertain etiology 41/32/4401   Chronic lower extremity pain (Bilateral) (L>R) 07/26/2016   Chronic upper extremity pain (Bilateral) (L>R) 07/26/2016   Tobacco use disorder 07/25/2016   Cigarette nicotine dependence 07/25/2016   Lumbar facet syndrome (Bilateral) (R>L) 07/25/2016   Chronic sacroiliac joint pain (Bilateral) (R>L) 07/25/2016   Chronic shoulder pain (Left) 07/25/2016   Peripheral neuropathy (HCC) (lower extremity) (Bilateral) (L>R) 07/25/2016   Neuropathic pain 07/25/2016   Musculoskeletal pain 07/25/2016   Chronic neck pain (Bilateral) (L>R) 07/25/2016   Long term current use of opiate analgesic 07/24/2016   Encounter for therapeutic drug level monitoring 07/24/2016   Encounter for pain management planning 07/24/2016   Long term prescription benzodiazepine use 07/24/2016   Chronic hip pain (Location of Primary Source of Pain) (Bilateral) (R>L) 07/24/2016   Annual physical exam 07/18/2016   Colon cancer screening 07/18/2016   Tongue lesion 06/21/2016   Abnormal mammogram 11/08/2015   Trochanteric bursitis (Bilateral) 08/16/2015   Hot flash, menopausal 08/16/2015   Compulsive tobacco user syndrome 08/16/2015   Chronic low back pain (Location of Secondary source of pain) (Bilateral) (R>L) 08/15/2015   CD (contact dermatitis) 08/15/2015   Menopausal and perimenopausal disorder 08/15/2015   Chronic lumbar pain 05/12/2015   Fibromyalgia 05/12/2015   Generalized anxiety disorder 05/12/2015   Chronic pain disorder 07/12/2010   Lumbosacral neuritis 02/23/2010    Allergies:  Allergies  Allergen Reactions   Fluoxetine Other (See Comments)    Confusion    Other Nausea Only and Other (See Comments)    Other  reaction(s): Unknown    Sulfa Antibiotics Nausea Only   Sulfasalazine Nausea Only   Medications:  Current Outpatient Medications:    ALPRAZolam (XANAX) 1 MG tablet, Take 1 mg by mouth 3 (three) times daily. , Disp: , Rfl:    amphetamine-dextroamphetamine (ADDERALL) 10 MG tablet, Take 10 mg by mouth 2 (two) times daily., Disp: , Rfl:    aspirin EC 81 MG tablet, Take 81 mg by mouth daily. , Disp: , Rfl:    Cholecalciferol (VITAMIN D3) 25 MCG (1000 UT) CAPS, Take 1,000 Units by mouth daily. , Disp: , Rfl:    cimetidine (TAGAMET) 200 MG tablet, Take 200 mg by mouth 2 (two) times daily., Disp: , Rfl:    conjugated estrogens (PREMARIN) vaginal cream, Place vaginally 2 (two) times a week., Disp: 30 g, Rfl: 3   estrogens, conjugated, (PREMARIN) 0.625 MG tablet, Take 1 tablet (0.625 mg total) by mouth daily., Disp: 90 tablet, Rfl: 3   gabapentin (NEURONTIN) 300 MG capsule, Take 300 mg by mouth at bedtime., Disp: , Rfl:    HYDROcodone-acetaminophen (NORCO) 10-325 MG tablet, Take 0.5 tablets by mouth 3 (three) times daily. (Patient not taking: Reported on 06/12/2022), Disp: , Rfl:    loratadine (CLARITIN) 10 MG tablet, Take 10 mg by mouth daily., Disp: , Rfl:    lovastatin (MEVACOR) 40 MG tablet, Take 1 tablet (40 mg total) by  mouth at bedtime. Further refills to be authorized at appointment. Thank you!, Disp: 90 tablet, Rfl: 3   methylPREDNISolone (MEDROL DOSEPAK) 4 MG TBPK tablet, 6 day taper; take as directed on package instructions, Disp: 21 tablet, Rfl: 0   NYAMYC powder, APPLY SMALL AMOUNT TOPICALLY EVERY DAY AS NEEDED., Disp: 60 g, Rfl: 1   nystatin (MYCOSTATIN/NYSTOP) powder, Apply 1 Application topically 2 (two) times daily., Disp: 60 g, Rfl: 1   tiZANidine (ZANAFLEX) 4 MG tablet, Take 4 mg by mouth in the morning and at bedtime., Disp: , Rfl:   Observations/Objective: Patient is well-developed, well-nourished in no acute distress.  Resting comfortably at home.  Head is normocephalic,  atraumatic.  No labored breathing.  Speech is clear and coherent with logical content.  Patient is alert and oriented at baseline.    Assessment and Plan: 1. Chronic midline low back pain with right-sided sciatica - methylPREDNISolone (MEDROL DOSEPAK) 4 MG TBPK tablet; 6 day taper; take as directed on package instructions  Dispense: 21 tablet; Refill: 0  - Suspect acute on chronic back pain with right sided sciatica - Failed NSAIDs - Add Medrol dose pack - Continue Gabapentin at bedtime and Tizanidine as needed  - Avoid NSAIDs (Ibuprofen/Advil/Motrin or Naproxen/Aleve) while on steroid - Tylenol if okay for breakthrough pain - Heat to area - Epsom salt soak if able to get in and out of bath tub safely - Back exercises and stretches provided via AVS - Seek in person evaluation if worsening or fails to improve with treatment   Follow Up Instructions: I discussed the assessment and treatment plan with the patient. The patient was provided an opportunity to ask questions and all were answered. The patient agreed with the plan and demonstrated an understanding of the instructions.  A copy of instructions were sent to the patient via MyChart unless otherwise noted below.    The patient was advised to call back or seek an in-person evaluation if the symptoms worsen or if the condition fails to improve as anticipated.  Time:  I spent 13 minutes with the patient via telehealth technology discussing the above problems/concerns.    Mar Daring, PA-C

## 2022-11-03 NOTE — Patient Instructions (Signed)
Ginger Carne, thank you for joining Mar Daring, PA-C for today's virtual visit.  While this provider is not your primary care provider (PCP), if your PCP is located in our provider database this encounter information will be shared with them immediately following your visit.   [facility] account gives you access to today's visit and all your visits, tests, and labs performed at [facility] " click here if you don't have a [facility] account or go to [email or web address]  Consent: (Patient) Patricia Rollins provided verbal consent for this virtual visit at the beginning of the encounter.  Current Medications:  Current Outpatient Medications:    methylPREDNISolone (MEDROL DOSEPAK) 4 MG TBPK tablet, 6 day taper; take as directed on package instructions, Disp: 21 tablet, Rfl: 0   ALPRAZolam (XANAX) 1 MG tablet, Take 1 mg by mouth 3 (three) times daily. , Disp: , Rfl:    amphetamine-dextroamphetamine (ADDERALL) 10 MG tablet, Take 10 mg by mouth 2 (two) times daily., Disp: , Rfl:    aspirin EC 81 MG tablet, Take 81 mg by mouth daily. , Disp: , Rfl:    Cholecalciferol (VITAMIN D3) 25 MCG (1000 UT) CAPS, Take 1,000 Units by mouth daily. , Disp: , Rfl:    cimetidine (TAGAMET) 200 MG tablet, Take 200 mg by mouth 2 (two) times daily., Disp: , Rfl:    conjugated estrogens (PREMARIN) vaginal cream, Place vaginally 2 (two) times a week., Disp: 30 g, Rfl: 3   estrogens, conjugated, (PREMARIN) 0.625 MG tablet, Take 1 tablet (0.625 mg total) by mouth daily., Disp: 90 tablet, Rfl: 3   gabapentin (NEURONTIN) 300 MG capsule, Take 300 mg by mouth at bedtime., Disp: , Rfl:    HYDROcodone-acetaminophen (NORCO) 10-325 MG tablet, Take 0.5 tablets by mouth 3 (three) times daily. (Patient not taking: Reported on 06/12/2022), Disp: , Rfl:    loratadine (CLARITIN) 10 MG tablet, Take 10 mg by mouth daily., Disp: , Rfl:    lovastatin (MEVACOR) 40 MG tablet, Take 1 tablet (40 mg  total) by mouth at bedtime. Further refills to be authorized at appointment. Thank you!, Disp: 90 tablet, Rfl: 3   NYAMYC powder, APPLY SMALL AMOUNT TOPICALLY EVERY DAY AS NEEDED., Disp: 60 g, Rfl: 1   nystatin (MYCOSTATIN/NYSTOP) powder, Apply 1 Application topically 2 (two) times daily., Disp: 60 g, Rfl: 1   tiZANidine (ZANAFLEX) 4 MG tablet, Take 4 mg by mouth in the morning and at bedtime., Disp: , Rfl:    Medications ordered in this encounter:  Meds ordered this encounter  Medications   methylPREDNISolone (MEDROL DOSEPAK) 4 MG TBPK tablet    Sig: 6 day taper; take as directed on package instructions    Dispense:  21 tablet    Refill:  0    Order Specific Question:   Supervising Provider    Answer:   Chase Picket [[id number]]     *If you need refills on other medications prior to your next appointment, please contact your pharmacy*  Follow-Up: Call back or seek an in-person evaluation if the symptoms worsen or if the condition fails to improve as anticipated.  [facility] [phone]  Other Instructions  Sciatica  Sciatica is pain, numbness, weakness, or tingling along the path of the sciatic nerve. The sciatic nerve starts in the lower back and runs down the back of each leg. The nerve controls the muscles in the lower leg and in the back of the knee. It also  provides feeling (sensation) to the back of the thigh, the lower leg, and the sole of the foot. Sciatica is a symptom of another medical condition that pinches or puts pressure on the sciatic nerve. Sciatica most often only affects one side of the body. Sciatica usually goes away on its own or with treatment. In some cases, sciatica may come back (recur). What are the causes? This condition is caused by pressure on the sciatic nerve or pinching of the nerve. This may be the result of: A disk in between the bones of the spine bulging out too far (herniated disk). Age-related changes in the spinal disks. A  pain disorder that affects a muscle in the buttock. Extra bone growth near the sciatic nerve. A break (fracture) of the pelvis. Pregnancy. Tumor. This is rare. What increases the risk? The following factors may make you more likely to develop this condition: Playing sports that place pressure or stress on the spine. Having poor strength and flexibility. A history of back injury or surgery. Sitting for long periods of time. Doing activities that involve repetitive bending or lifting. Obesity. What are the signs or symptoms? Symptoms can vary from mild to very severe. They may include: Any of the following problems in the lower back, leg, hip, or buttock: Mild tingling, numbness, or dull aches. Burning sensations. Sharp pains. Numbness in the back of the calf or the sole of the foot. Leg weakness. Severe back pain that makes movement difficult. Symptoms may get worse when you cough, sneeze, or laugh, or when you sit or stand for long periods of time. How is this diagnosed? This condition may be diagnosed based on: Your symptoms and medical history. A physical exam. Blood tests. Imaging tests, such as: X-rays. An MRI. A CT scan. How is this treated? In many cases, this condition improves on its own without treatment. However, treatment may include: Reducing or modifying physical activity. Exercising, including strengthening and stretching. Icing and applying heat to the affected area. Medicines that help to: Relieve pain and swelling. Relax your muscles. Injections of medicines that help to relieve pain and inflammation (steroids) around the sciatic nerve. Surgery. Follow these instructions at home: Medicines Take over-the-counter and prescription medicines only as told by your health care provider. Ask your health care provider if the medicine prescribed to you requires you to avoid driving or using heavy machinery. Managing pain     If directed, put ice on the  affected area. To do this: Put ice in a plastic bag. Place a towel between your skin and the bag. Leave the ice on for 20 minutes, 2-3 times a day. If your skin turns bright red, remove the ice right away to prevent skin damage. The risk of skin damage is higher if you cannot feel pain, heat, or cold. If directed, apply heat to the affected area as often as told by your health care provider. Use the heat source that your health care provider recommends, such as a moist heat pack or a heating pad. Place a towel between your skin and the heat source. Leave the heat on for 20-30 minutes. If your skin turns bright red, remove the heat right away to prevent burns. The risk of burns is higher if you cannot feel pain, heat, or cold. Activity  Return to your normal activities as told by your health care provider. Ask your health care provider what activities are safe for you. Avoid activities that make your symptoms worse. Take brief periods of  rest throughout the day. When you rest for longer periods, mix in some mild activity or stretching between periods of rest. This will help to prevent stiffness and pain. Avoid sitting for long periods of time without moving. Get up and move around at least one time each hour. Exercise and stretch regularly as told by your health care provider. Do not lift anything that is heavier than 10 lb (4.5 kg) until your health care provider says that it is safe. When you do not have symptoms, you should still avoid heavy lifting, especially repetitive heavy lifting. When you lift objects, always use proper lifting technique, which includes: Bending your knees. Keeping the load close to your body. Avoiding twisting. General instructions Maintain a healthy weight. Excess weight puts extra stress on your back. Wear supportive, comfortable shoes. Avoid wearing high heels. Avoid sleeping on a mattress that is too soft or too hard. A mattress that is firm enough to support  your back when you sleep may help to reduce your pain. Contact a health care provider if: Your pain is not controlled by medicine. Your pain does not improve or gets worse. Your pain lasts longer than 4 weeks. You have unexplained weight loss. Get help right away if: You are not able to control when you urinate or have bowel movements (incontinence). You have: Weakness in your lower back, pelvis, buttocks, or legs that gets worse. Redness or swelling of your back. A burning sensation when you urinate. Summary Sciatica is pain, numbness, weakness, or tingling along the path of the sciatic nerve, which may include the lower back, legs, hips, and buttocks. This condition is caused by pressure on the sciatic nerve or pinching of the nerve. Treatment often includes rest, exercise, medicines, and applying ice or heat. This information is not intended to replace advice given to you by your health care provider. Make sure you discuss any questions you have with your health care provider. Document Revised: 02/05/2022 Document Reviewed: 02/05/2022 Elsevier Patient Education  [place].    If you have been instructed to have an in-person evaluation today at a local Urgent Care facility, please use the link below. It will take you to a list of all of our available [facility], including address, phone number and hours of operation. Please do not delay care.  [facility]  If you or a family member do not have a primary care provider, use the link below to schedule a visit and establish care. When you choose a  primary care physician or advanced practice provider, you gain a long-term partner in health. Find a Primary Care Provider  Learn more about 's in-office and virtual care options: [facility]

## 2022-12-20 ENCOUNTER — Ambulatory Visit: Payer: Medicaid Other | Attending: Cardiovascular Disease

## 2022-12-20 DIAGNOSIS — I739 Peripheral vascular disease, unspecified: Secondary | ICD-10-CM

## 2022-12-20 LAB — VAS US LOWER EXT ART SEG MULTI (SEGMENTALS & LE RAYNAUDS)
Left ABI: 1.24
Right ABI: 1.21

## 2023-02-13 NOTE — Progress Notes (Deleted)
Patricia Haggard, FNP   No chief complaint on file.   HPI:      [name] is a 62 y.o. [phone] whose LMP was No LMP recorded. Patient has had a hysterectomy., presents today for ***  On vag ERT, takes oral ERT  Patient Active Problem List   Diagnosis Date Noted   No-show for appointment 09/27/2020   Incisional hernia, without obstruction or gangrene 01/07/2020   Vaginal atrophy 06/28/2017   Coronary artery disease involving native coronary artery of native heart 03/06/2017   Stable angina pectoris 03/06/2017   Renal infarct 02/26/2017   Abdominal pain 02/26/2017   Midline thoracic back pain 02/01/2017   LUQ abdominal tenderness 01/03/2017   Renal lesion 01/03/2017   Spinal stenosis of lumbar region 12/25/2016   Epigastric abdominal pain 11/21/2016   Constipation 10/23/2016   Lipoma of chest wall 10/23/2016   Coronary artery disease involving coronary bypass graft of native heart without angina pectoris 09/13/2016   Aortic atherosclerosis 09/13/2016   PAD (peripheral artery disease) 09/13/2016   Pure hypercholesterolemia 09/13/2016   Marijuana use 08/09/2016   Chest pain of uncertain etiology [id number]   Chronic lower extremity pain (Bilateral) (L>R) 07/26/2016   Chronic upper extremity pain (Bilateral) (L>R) 07/26/2016   Tobacco use disorder 07/25/2016   Cigarette nicotine dependence 07/25/2016   Lumbar facet syndrome (Bilateral) (R>L) 07/25/2016   Chronic sacroiliac joint pain (Bilateral) (R>L) 07/25/2016   Chronic shoulder pain (Left) 07/25/2016   Peripheral neuropathy (HCC) (lower extremity) (Bilateral) (L>R) 07/25/2016   Neuropathic pain 07/25/2016   Musculoskeletal pain 07/25/2016   Chronic neck pain (Bilateral) (L>R) 07/25/2016   Long term current use of opiate analgesic 07/24/2016   Encounter for therapeutic drug level monitoring 07/24/2016   Encounter for pain management planning 07/24/2016   Long term prescription benzodiazepine use 07/24/2016    Chronic hip pain (Location of Primary Source of Pain) (Bilateral) (R>L) 07/24/2016   Annual physical exam 07/18/2016   Colon cancer screening 07/18/2016   Tongue lesion 06/21/2016   Abnormal mammogram 11/08/2015   Trochanteric bursitis (Bilateral) 08/16/2015   Hot flash, menopausal 08/16/2015   Compulsive tobacco user syndrome 08/16/2015   Chronic low back pain (Location of Secondary source of pain) (Bilateral) (R>L) 08/15/2015   CD (contact dermatitis) 08/15/2015   Menopausal and perimenopausal disorder 08/15/2015   Chronic lumbar pain 05/12/2015   Fibromyalgia 05/12/2015   Generalized anxiety disorder 05/12/2015   Chronic pain disorder 07/12/2010   Lumbosacral neuritis 02/23/2010    Past Surgical History:  Procedure Laterality Date   ABDOMINAL HYSTERECTOMY     APPENDECTOMY     COLONOSCOPY WITH PROPOFOL N/A 05/08/2017   Procedure: COLONOSCOPY WITH PROPOFOL;  Surgeon: Robert Bellow, MD;  Location: [facility];  Service: Endoscopy;  Laterality: N/A;   ESOPHAGOGASTRODUODENOSCOPY (EGD) WITH PROPOFOL N/A 05/08/2017   Procedure: ESOPHAGOGASTRODUODENOSCOPY (EGD) WITH PROPOFOL;  Surgeon: Robert Bellow, MD;  Location: [facility];  Service: Endoscopy;  Laterality: N/A;   OOPHORECTOMY Right    TONSILLECTOMY AND ADENOIDECTOMY Bilateral     Family History  Problem Relation Age of Onset   Ovarian cancer Mother    Pancreatic cancer Mother    Bladder Cancer Neg Hx    Kidney cancer Neg Hx    Prostate cancer Neg Hx     Social History   Socioeconomic History   Marital status: Single    Spouse name: Not on file   Number of children: 3   Years of education: Not on  file   Highest education level: Not on file  Occupational History   Not on file  Tobacco Use   Smoking status: Every Day    Packs/day: .25    Types: Cigarettes   Smokeless tobacco: Never  Vaping Use   Vaping Use: Never used  Substance and Sexual Activity   Alcohol use: No    Alcohol/week: 0.0 standard  drinks of alcohol   Drug use: No   Sexual activity: Never    Birth control/protection: Surgical  Other Topics Concern   Not on file  Social History Narrative   Not on file   Social Determinants of Health   Financial Resource Strain: Not on file  Food Insecurity: Not on file  Transportation Needs: Not on file  Physical Activity: Not on file  Stress: Not on file  Social Connections: Not on file  Intimate Partner Violence: Not on file    Outpatient Medications Prior to Visit  Medication Sig Dispense Refill   ALPRAZolam (XANAX) 1 MG tablet Take 1 mg by mouth 3 (three) times daily.      amphetamine-dextroamphetamine (ADDERALL) 10 MG tablet Take 10 mg by mouth 2 (two) times daily.     aspirin EC 81 MG tablet Take 81 mg by mouth daily.      Cholecalciferol (VITAMIN D3) 25 MCG (1000 UT) CAPS Take 1,000 Units by mouth daily.      cimetidine (TAGAMET) 200 MG tablet Take 200 mg by mouth 2 (two) times daily.     conjugated estrogens (PREMARIN) vaginal cream Place vaginally 2 (two) times a week. 30 g 3   estrogens, conjugated, (PREMARIN) 0.625 MG tablet Take 1 tablet (0.625 mg total) by mouth daily. 90 tablet 3   gabapentin (NEURONTIN) 300 MG capsule Take 300 mg by mouth at bedtime.     HYDROcodone-acetaminophen (NORCO) 10-325 MG tablet Take 0.5 tablets by mouth 3 (three) times daily. (Patient not taking: Reported on 06/12/2022)     loratadine (CLARITIN) 10 MG tablet Take 10 mg by mouth daily.     lovastatin (MEVACOR) 40 MG tablet Take 1 tablet (40 mg total) by mouth at bedtime. Further refills to be authorized at appointment. Thank you! 90 tablet 3   methylPREDNISolone (MEDROL DOSEPAK) 4 MG TBPK tablet 6 day taper; take as directed on package instructions 21 tablet 0   NYAMYC powder APPLY SMALL AMOUNT TOPICALLY EVERY DAY AS NEEDED. 60 g 1   nystatin (MYCOSTATIN/NYSTOP) powder Apply 1 Application topically 2 (two) times daily. 60 g 1   tiZANidine (ZANAFLEX) 4 MG tablet Take 4 mg by mouth in the  morning and at bedtime.     No facility-administered medications prior to visit.      ROS:  Review of Systems BREAST: No symptoms   OBJECTIVE:   Vitals:  There were no vitals taken for this visit.  Physical Exam  Results: No results found for this or any previous visit (from the past 24 hour(s)).   Assessment/Plan: No diagnosis found.    No orders of the defined types were placed in this encounter.     No follow-ups on file.  Keyonia Gluth B. Paulett Kaufhold, PA-C 02/13/2023 9:36 PM

## 2023-02-14 ENCOUNTER — Ambulatory Visit: Payer: Medicaid Other | Admitting: Obstetrics and Gynecology

## 2023-02-28 ENCOUNTER — Telehealth: Payer: Self-pay

## 2023-02-28 NOTE — Telephone Encounter (Signed)
Pt tx'd to triage line; is trying to schedule an appt; [facility] is booked; is there anyone else that could see her for a vaginal infection?  [phone]

## 2023-03-07 NOTE — Telephone Encounter (Signed)
The patient is scheduled for 4/26 with LMD. The patient is advised to follow up for scheduled appointment.

## 2023-03-08 ENCOUNTER — Ambulatory Visit (INDEPENDENT_AMBULATORY_CARE_PROVIDER_SITE_OTHER): Payer: Medicaid Other | Admitting: Licensed Practical Nurse

## 2023-03-08 VITALS — BP 119/104 | HR 106 | Wt 156.6 lb

## 2023-03-08 DIAGNOSIS — R1032 Left lower quadrant pain: Secondary | ICD-10-CM

## 2023-03-08 DIAGNOSIS — N952 Postmenopausal atrophic vaginitis: Secondary | ICD-10-CM

## 2023-03-11 NOTE — Progress Notes (Incomplete)
Gynecology Pelvic Pain Evaluation   Chief Complaint: pain and the sensation like something is coming out her for 3 weeks.   History of Present Illness:   Patient is a 62 y.o. [id number] who LMP was No LMP recorded. Patient has had a hysterectomy., presents today for a problem visit. Pt arrived 15 mins past her appointment time, stating she went to the wrong building as she was unaware the practice moved (pt was previously seen at [facility]).  She complains of "something coming out of her". "It feels like a ball of glass" It feels like it is coming out of vagina, there is also a burning sensation. It is difficult to sit down. She has noticed her perineum is swollen, more on her left than her right side. She reports the sensation subsides when she is on her side, but when she stands she will feel it "move down" and then she gets a feeling of pressure like she needs to give birth.  This has been happening for 3 weeks, she has not seen her PCP because her PCP does not manage anything with the pelvis. She has not been able to get into our office until now.   Pt reports that she passed a kidney stone 3 weeks ago, wonders if she has a stone stuck somewhere. The burning sensation feels like the kidney stone pain but is in her vaginal area.   She has tried wipe to the area. Warm showers may help a little. She has been prescribed Premarin cream but has not used it since this pain started because she did not want to put anything in that area. She has not tried Tylenol because  tries to stay away from it, she has not tried Motrin as it upsets her stomach.   Her last BM was 2 days ago, she does struggle with constipation.   Denies any knowledge of having a hernia or varicose veins. She had a hysterectomy      PMHx: She  has a past medical history of Anxiety, Arthritis, CAD (coronary artery disease), Chronic pancreatitis (HCC), Colon polyp (05/08/2017), Depression, Fibromyalgia, GERD (gastroesophageal reflux  disease), Hematuria (06/21/2016), HLD (hyperlipidemia), IBS (irritable bowel syndrome), and Neuromuscular disorder (HCC). Also,  has a past surgical history that includes Abdominal hysterectomy; Tonsillectomy and adenoidectomy (Bilateral); Oophorectomy (Right); Appendectomy; Esophagogastroduodenoscopy (egd) with propofol (N/A, 05/08/2017); and Colonoscopy with propofol (N/A, 05/08/2017)., family history includes Ovarian cancer in her mother; Pancreatic cancer in her mother.,  reports that she has been smoking cigarettes. She has been smoking an average of .25 packs per day. She has never used smokeless tobacco. She reports that she does not drink alcohol and does not use drugs.  She has a current medication list which includes the following prescription(s): alprazolam, amphetamine-dextroamphetamine, aspirin ec, vitamin d3, cimetidine, premarin, estrogens (conjugated), gabapentin, hydrocodone-acetaminophen, loratadine, lovastatin, methylprednisolone, nyamyc, nystatin, and tizanidine. Also, is allergic to fluoxetine, other, sulfa antibiotics, and sulfasalazine.  ROS see HPI   Objective: BP (!) 119/104   Pulse (!) 106   Wt 156 lb 9.6 oz (71 kg)   BMI 26.06 kg/m  Physical Exam Constitutional:      General: She is in acute distress.     Comments: Pt appears very uncomfortable, initially standing and leaning over exam table as she did not want to sit.   Genitourinary:     Genitourinary Comments: With pt on the left side: vagina with atrophy, perineum appears normal, a little swelling  noted on left side, no masses or  bulges palpated in groin or vaginal opening. Hemorrhoids  present   With pt reclined: external exam normal, no swelling, no masses or bulges palpated  spec exam by Dr Valentino Saxon shows normal vagina, small rectocele, no cystocele.    With pt standing: no change in exam  Pt remained in room for another 15-20 mins then reported she felt that pain and need to push like she was having a baby, with  pt in chair and tilted to her left: Perineum without with swelling or bulges, on deep palpation a firm thin area palpated, perhaps a ligament or tendon-pt reported tenderness at that area.   Pulmonary:     Effort: Pulmonary effort is normal.  Neurological:     Mental Status: She is alert.     Female chaperone present for pelvic portion of the physical exam  Assessment: 62 y.o. G3P3003 with pain in the groin, possibly muscular skeletal related -vaginal atrophy    1. Pain in the groin, left  - Ambulatory referral to Physical Therapy  Problem List Items Addressed This Visit   None Visit Diagnoses     Pain in the groin, left    -  Primary   Relevant Orders   Ambulatory referral to Physical Therapy      -Discussed assessment with Dr Valentino Saxon. There is no obvious source of the pt's discomfort, rec PT for possible MSK involvement.  -Reviewed  with pt that I am not sure why she is in pain, there is nothing coming form her vagina, reviewed anatomy of kidney/bladder she is not passing a stone through her vagina (pt's complaint in in the lower vagina not near the urthrea)

## 2023-03-12 ENCOUNTER — Telehealth: Payer: Self-pay

## 2023-03-12 NOTE — Telephone Encounter (Signed)
Pt calling; was seen Fri and examined by [facility]; as swab was done for a bacteria inf - hasn't heard results; ASC said she has hernia - does she need a scan or surgery to get it removed or something or an appt with ASC?  [phone]  Called pt and tried to tell her that a swab was not done; pt states it was done and that ASC did it; wants to know the results.  Same thing about the hernia; pt would listen that it wasn't a hernia; pt still asked about a scan or getting it removed. Adv pt about the PT; to call [facility]; pt states her insurance only covers two Pts a year.  Pt wants to talk with [facility].

## 2023-03-12 NOTE — Progress Notes (Signed)
Gynecology Pelvic Pain Evaluation   Chief Complaint: pain and the sensation like something is coming out her for 3 weeks.   History of Present Illness:   Patient is a 62 y.o. [id number] who LMP was No LMP recorded. Patient has had a hysterectomy., presents today for a problem visit. Pt arrived 15 mins past her appointment time, stating she went to the wrong building as she was unaware the practice moved (pt was previously seen at [facility]).  She complains of "something coming out of her". "It feels like a ball of glass" It feels like it is coming out of vagina, there is also a burning sensation. It is difficult to sit down. She has noticed her perineum is swollen, more on her left than her right side. She reports the sensation subsides when she is on her side, but when she stands she will feel it "move down" and then she gets a feeling of pressure like she needs to give birth.  This has been happening for 3 weeks, she has not seen her PCP because her PCP does not manage anything with the pelvis. She has not been able to get into our office until now.   Pt reports that she passed a kidney stone 3 weeks ago, wonders if she has a stone stuck somewhere. The burning sensation feels like the kidney stone pain but is in her vaginal area.   She has tried wipe to the area. Warm showers may help a little. She has been prescribed Premarin cream but has not used it since this pain started because she did not want to put anything in that area. She has not tried Tylenol because  tries to stay away from it, she has not tried Motrin as it upsets her stomach.   Her last BM was 2 days ago, she does struggle with constipation.   Denies any knowledge of having a hernia or varicose veins. She had a hysterectomy      PMHx: She  has a past medical history of Anxiety, Arthritis, CAD (coronary artery disease), Chronic pancreatitis (HCC), Colon polyp (05/08/2017), Depression, Fibromyalgia, GERD (gastroesophageal reflux  disease), Hematuria (06/21/2016), HLD (hyperlipidemia), IBS (irritable bowel syndrome), and Neuromuscular disorder (HCC). Also,  has a past surgical history that includes Abdominal hysterectomy; Tonsillectomy and adenoidectomy (Bilateral); Oophorectomy (Right); Appendectomy; Esophagogastroduodenoscopy (egd) with propofol (N/A, 05/08/2017); and Colonoscopy with propofol (N/A, 05/08/2017)., family history includes Ovarian cancer in her mother; Pancreatic cancer in her mother.,  reports that she has been smoking cigarettes. She has been smoking an average of .25 packs per day. She has never used smokeless tobacco. She reports that she does not drink alcohol and does not use drugs.  She has a current medication list which includes the following prescription(s): alprazolam, amphetamine-dextroamphetamine, aspirin ec, vitamin d3, cimetidine, premarin, estrogens (conjugated), gabapentin, hydrocodone-acetaminophen, loratadine, lovastatin, methylprednisolone, nyamyc, nystatin, and tizanidine. Also, is allergic to fluoxetine, other, sulfa antibiotics, and sulfasalazine.  ROS see HPI   Objective: BP (!) 119/104   Pulse (!) 106   Wt 156 lb 9.6 oz (71 kg)   BMI 26.06 kg/m  Physical Exam Constitutional:      General: She is in acute distress.     Comments: Pt appears very uncomfortable, initially standing and leaning over exam table as she did not want to sit.   Genitourinary:     Genitourinary Comments: With pt on the left side: vagina with atrophy, perineum appears normal, a little swelling  noted on left side, no masses or  bulges palpated in groin or vaginal opening. Hemorrhoids  present   With pt reclined: external exam normal, no swelling, no masses or bulges palpated  spec exam by Dr Valentino Saxon shows normal vagina, small rectocele, no cystocele.    With pt standing: no change in exam  Pt remained in room for another 15-20 mins then reported she felt that pain and need to push like she was having a baby, with  pt in chair and tilted to her left: Perineum without with swelling or bulges, on deep palpation a firm thin area palpated, perhaps a ligament or tendon-pt reported tenderness at that area.   Pulmonary:     Effort: Pulmonary effort is normal.  Neurological:     Mental Status: She is alert.     Female chaperone present for pelvic portion of the physical exam  Assessment: 62 y.o. G3P3003 with pain in the groin, possibly muscular skeletal related -vaginal atrophy    1. Pain in the groin, left  - Ambulatory referral to Physical Therapy  Problem List Items Addressed This Visit   None Visit Diagnoses     Pain in the groin, left    -  Primary   Relevant Orders   Ambulatory referral to Physical Therapy      -Discussed assessment with Dr Valentino Saxon. There is no obvious source of the pt's discomfort, rec PT for possible MSK involvement.  -Reviewed  with pt that I am not sure why she is in pain, there is nothing coming from her vagina, reviewed anatomy of kidney/bladder she is not passing a stone through her vagina (pt's complaint in in the lower vagina not near the urthrea). She may see her PCP as this seems to not be a GYN concern. She may also go directly to the Ed for a second evaluation.  -reminded pt to take her Premarin cream, this may help the burning sensation she is experiencing  -pt may return to clinic if she experiences the pressure feeling again.   Carie Caddy, CNM   [facility]  03/12/23  2:19 PM '

## 2023-03-13 NOTE — Telephone Encounter (Signed)
I did not collect any swabs on this patient, I only performed a secondary exam as she was initially seeing Isabelle Course who came to me for consult so I can check with her to see if one was done.  Also, I advised that the next time she felt a bulge to come back in to see me so I could see it when it was active to determine if it truly was a hernia (I told her I was suspicious of the diagnosis, but needed to verify before ordering imaging).

## 2023-03-13 NOTE — Telephone Encounter (Signed)
If she wants me to evaluate if further, she can schedule an appointment with me.

## 2023-03-18 NOTE — Telephone Encounter (Signed)
Pt is scheduled with Dr. Valentino Saxon on 03/19/2023 at 8:35.

## 2023-03-19 ENCOUNTER — Ambulatory Visit: Payer: Medicaid Other | Admitting: Obstetrics and Gynecology

## 2023-04-05 ENCOUNTER — Emergency Department
Admission: EM | Admit: 2023-04-05 | Discharge: 2023-04-05 | Discharge status: Against Medical Advice | Payer: Medicaid Other | Attending: Emergency Medicine | Admitting: Emergency Medicine

## 2023-04-05 ENCOUNTER — Other Ambulatory Visit: Payer: Self-pay

## 2023-04-05 DIAGNOSIS — R109 Unspecified abdominal pain: Secondary | ICD-10-CM | POA: Diagnosis present

## 2023-04-05 DIAGNOSIS — Z5321 Procedure and treatment not carried out due to patient leaving prior to being seen by health care provider: Secondary | ICD-10-CM | POA: Insufficient documentation

## 2023-04-05 LAB — CBC
HCT: 43 % (ref 36.0–46.0)
Hemoglobin: 14.6 g/dL (ref 12.0–15.0)
MCH: 32.8 pg (ref 26.0–34.0)
MCHC: 34 g/dL (ref 30.0–36.0)
MCV: 96.6 fL (ref 80.0–100.0)
Platelets: 368 10*3/uL (ref 150–400)
RBC: 4.45 MIL/uL (ref 3.87–5.11)
RDW: 13.6 % (ref 11.5–15.5)
WBC: 8 10*3/uL (ref 4.0–10.5)
nRBC: 0 % (ref 0.0–0.2)

## 2023-04-05 LAB — URINALYSIS, ROUTINE W REFLEX MICROSCOPIC
Bilirubin Urine: NEGATIVE
Glucose, UA: NEGATIVE mg/dL
Ketones, ur: NEGATIVE mg/dL
Leukocytes,Ua: NEGATIVE
Nitrite: NEGATIVE
Protein, ur: NEGATIVE mg/dL
Specific Gravity, Urine: 1.009 (ref 1.005–1.030)
pH: 5 (ref 5.0–8.0)

## 2023-04-05 LAB — COMPREHENSIVE METABOLIC PANEL
ALT: 12 U/L (ref 0–44)
AST: 18 U/L (ref 15–41)
Albumin: 3.7 g/dL (ref 3.5–5.0)
Alkaline Phosphatase: 64 U/L (ref 38–126)
Anion gap: 11 (ref 5–15)
BUN: 6 mg/dL — ABNORMAL LOW (ref 8–23)
CO2: 23 mmol/L (ref 22–32)
Calcium: 8.8 mg/dL — ABNORMAL LOW (ref 8.9–10.3)
Chloride: 107 mmol/L (ref 98–111)
Creatinine, Ser: 0.87 mg/dL (ref 0.44–1.00)
GFR, Estimated: >60 mL/min (ref >60)
Glucose, Bld: 100 mg/dL — ABNORMAL HIGH (ref 70–99)
Potassium: 3.4 mmol/L — ABNORMAL LOW (ref 3.5–5.1)
Sodium: 141 mmol/L (ref 135–145)
Total Bilirubin: 0.5 mg/dL (ref 0.3–1.2)
Total Protein: 7.2 g/dL (ref 6.5–8.1)

## 2023-04-05 LAB — LIPASE, BLOOD: Lipase: 58 U/L — ABNORMAL HIGH (ref 11–51)

## 2023-04-05 NOTE — ED Triage Notes (Addendum)
Pt to ED from home for abdominal pain x 3 weeks. Pt states "I can't take it anymore so I came in here". Pt is CAOx4, in no acute distress and ambulatory in triage. Pt states "I think its gallstones". Pt did state she passed some kidney stones last month.  Pt states she has not been to the doctor about any of this, states she was told a years ago she had kidney stones and thinks she just passed them recently.

## 2023-04-11 ENCOUNTER — Telehealth: Payer: Self-pay

## 2023-04-11 NOTE — Telephone Encounter (Signed)
Pt would like a call from Dr Valentino Saxon Nurse. She states she was seen in the ER and was told she has a bacterial infection. Looks like they did a urin dip.Marland KitchenMarland KitchenShe feels the same as she does when she seen lydia in April. Pt only wants to speak to Dr Valentino Saxon or Her Nurse because she has seen [place] for years. She wants to know what to do next. Its too hard to get a visit with Dr Valentino Saxon,

## 2023-04-12 NOTE — Telephone Encounter (Signed)
Called patient. She continues to have the pulling feeling, pain in her vagina and it also feels like a bulge or something is falling out. She has concerns about having a UTI. Microscopic U/A was done in ED and she had Moderate blood and bacteria unknown. No urine culture was obtained. I suggested that she come in today or Monday and leave sample for urine culture. She verbalized understanding. I told her to that I would send message to you for further advise.

## 2023-04-12 NOTE — Telephone Encounter (Signed)
Please contact patient to see what her concerns are.

## 2023-04-18 ENCOUNTER — Encounter: Payer: Self-pay | Admitting: Obstetrics and Gynecology

## 2023-04-18 DIAGNOSIS — R102 Pelvic and perineal pain: Secondary | ICD-10-CM

## 2023-04-22 ENCOUNTER — Telehealth: Payer: Self-pay | Admitting: Obstetrics and Gynecology

## 2023-04-22 NOTE — Telephone Encounter (Signed)
Reached out to pt to schedule a follow up appt with Dr. Valentino Saxon.  No answer, left message for pt to call back.

## 2023-04-22 NOTE — Telephone Encounter (Signed)
Please see if we can get her scheduled for 4:15 on June 12th. That is my only work in slot for this week. Otherwise, she'll need to be seen next week sometime.

## 2023-04-23 NOTE — Telephone Encounter (Signed)
I contacted the patient via phone. I left generic message for the patient to call back to scheduled an appointment.

## 2023-04-24 NOTE — Telephone Encounter (Signed)
The patient is schedule for 6/13 with [facility]

## 2023-04-24 NOTE — Progress Notes (Deleted)
    GYNECOLOGY PROGRESS NOTE  Subjective:    Patient ID: Patricia Rollins, female    DOB: 06-27-1961, 62 y.o.   MRN: [id number]  HPI  Patient is a 62 y.o. G73P3003 female who presents for   {Common ambulatory SmartLinks:19316}  Review of Systems {ros; complete:30496}   Objective:   There were no vitals taken for this visit. There is no height or weight on file to calculate BMI. General appearance: {general exam:16600} Abdomen: {abdominal exam:16834} Pelvic: {pelvic exam:16852::"cervix normal in appearance","external genitalia normal","no adnexal masses or tenderness","no cervical motion tenderness","rectovaginal septum normal","uterus normal size, shape, and consistency","vagina normal without discharge"} Extremities: {extremity exam:5109} Neurologic: {neuro exam:17854}   Assessment:   No diagnosis found.   Plan:   There are no diagnoses linked to this encounter.

## 2023-04-25 ENCOUNTER — Ambulatory Visit: Payer: Medicaid Other | Admitting: Obstetrics and Gynecology

## 2023-04-29 NOTE — Telephone Encounter (Signed)
I contacted the patient via phone. I spoke with patient about an opening for Wednesday, 4/19 8:15 am. It is the earliest appointment I can offer for an office visit. The patient states "she has problems with transportation and doesn't do early visit". I explained "then the next opening to offer was on 7/10 due to Dr. Valentino Saxon being out of the office next week ". The patient states "she has been trying to come in for this visit since her ER visit and that she could wait". I advised the patient we have tried getting her in for visits, but the scheduled appointments were not completed by the patient.I offered to call Medicaid transportation for the patient for this visit since transportation seems to be an issue. The patient declined. She patient is wanting to know if Dr. Valentino Saxon has any work in for tomorrow. I advised "we currently have no cancellations at this time". The patient is requesting me to send an message to Dr. Oretha Milch nurse to see if she could see if she could get her worked in since she knows what is going on with her. Please advise?

## 2023-04-30 NOTE — Progress Notes (Deleted)
    GYNECOLOGY PROGRESS NOTE  Subjective:    Patient ID: Patricia Rollins, female    DOB: November 29, 1960, 62 y.o.   MRN: [id number]  HPI  Patient is a 62 y.o. G36P3003 female who presents for follow up from ED. She was seen in the ED on 04/18/2023 for abdominal pain and discomfort.  {Common ambulatory SmartLinks:19316}  Review of Systems {ros; complete:30496}   Objective:   There were no vitals taken for this visit. There is no height or weight on file to calculate BMI. General appearance: {general exam:16600} Abdomen: {abdominal exam:16834} Pelvic: {pelvic exam:16852::"cervix normal in appearance","external genitalia normal","no adnexal masses or tenderness","no cervical motion tenderness","rectovaginal septum normal","uterus normal size, shape, and consistency","vagina normal without discharge"} Extremities: {extremity exam:5109} Neurologic: {neuro exam:17854}   Assessment:   No diagnosis found.   Plan:   There are no diagnoses linked to this encounter.  \   Hildred Laser, MD [facility]

## 2023-05-01 ENCOUNTER — Ambulatory Visit: Payer: Medicaid Other | Admitting: Obstetrics and Gynecology

## 2023-05-08 ENCOUNTER — Encounter: Payer: Self-pay | Admitting: Obstetrics and Gynecology

## 2023-05-20 ENCOUNTER — Ambulatory Visit: Payer: Medicaid Other

## 2023-05-21 ENCOUNTER — Ambulatory Visit: Admission: RE | Admit: 2023-05-21 | Payer: Medicaid Other | Source: Ambulatory Visit

## 2023-05-22 ENCOUNTER — Ambulatory Visit: Payer: Medicaid Other | Admitting: Obstetrics and Gynecology

## 2023-07-05 ENCOUNTER — Telehealth: Payer: Medicaid Other | Admitting: Physician Assistant

## 2023-07-05 DIAGNOSIS — R3989 Other symptoms and signs involving the genitourinary system: Secondary | ICD-10-CM

## 2023-07-05 MED ORDER — PHENAZOPYRIDINE HCL 100 MG PO TABS
100.0000 mg | ORAL_TABLET | Freq: Three times a day (TID) | ORAL | 0 refills | Status: AC | PRN
Start: 2023-07-05 — End: ?

## 2023-07-05 MED ORDER — CEPHALEXIN 500 MG PO CAPS
500.0000 mg | ORAL_CAPSULE | Freq: Two times a day (BID) | ORAL | 0 refills | Status: DC
Start: 2023-07-05 — End: 2024-02-12

## 2023-07-05 NOTE — Patient Instructions (Signed)
Patricia Rollins, thank you for joining Patricia Loveless, PA-C for today's virtual visit.  While this provider is not your primary care provider (PCP), if your PCP is located in our provider database this encounter information will be shared with them immediately following your visit.   A [facility] MyChart account gives you access to today's visit and all your visits, tests, and labs performed at [facility] " click here if you don't have a [facility] MyChart account or go to [email or web address]  Consent: (Patient) Patricia Rollins provided verbal consent for this virtual visit at the beginning of the encounter.  Current Medications:  Current Outpatient Medications:    cephALEXin (KEFLEX) 500 MG capsule, Take 1 capsule (500 mg total) by mouth 2 (two) times daily., Disp: 14 capsule, Rfl: 0   phenazopyridine (PYRIDIUM) 100 MG tablet, Take 1 tablet (100 mg total) by mouth 3 (three) times daily as needed for pain., Disp: 10 tablet, Rfl: 0   ALPRAZolam (XANAX) 1 MG tablet, Take 1 mg by mouth 3 (three) times daily. , Disp: , Rfl:    amphetamine-dextroamphetamine (ADDERALL) 10 MG tablet, Take 10 mg by mouth 2 (two) times daily., Disp: , Rfl:    aspirin EC 81 MG tablet, Take 81 mg by mouth daily. , Disp: , Rfl:    Cholecalciferol (VITAMIN D3) 25 MCG (1000 UT) CAPS, Take 1,000 Units by mouth daily. , Disp: , Rfl:    cimetidine (TAGAMET) 200 MG tablet, Take 200 mg by mouth 2 (two) times daily., Disp: , Rfl:    conjugated estrogens (PREMARIN) vaginal cream, Place vaginally 2 (two) times a week., Disp: 30 g, Rfl: 3   estrogens, conjugated, (PREMARIN) 0.625 MG tablet, Take 1 tablet (0.625 mg total) by mouth daily., Disp: 90 tablet, Rfl: 3   gabapentin (NEURONTIN) 300 MG capsule, Take 300 mg by mouth at bedtime., Disp: , Rfl:    loratadine (CLARITIN) 10 MG tablet, Take 10 mg by mouth daily., Disp: , Rfl:    lovastatin (MEVACOR) 40 MG tablet, Take 1 tablet (40 mg total) by mouth at  bedtime. Further refills to be authorized at appointment. Thank you!, Disp: 90 tablet, Rfl: 3   methylPREDNISolone (MEDROL DOSEPAK) 4 MG TBPK tablet, 6 day taper; take as directed on package instructions, Disp: 21 tablet, Rfl: 0   NYAMYC powder, APPLY SMALL AMOUNT TOPICALLY EVERY DAY AS NEEDED., Disp: 60 g, Rfl: 1   nystatin (MYCOSTATIN/NYSTOP) powder, Apply 1 Application topically 2 (two) times daily., Disp: 60 g, Rfl: 1   tiZANidine (ZANAFLEX) 4 MG tablet, Take 4 mg by mouth in the morning and at bedtime., Disp: , Rfl:    Medications ordered in this encounter:  Meds ordered this encounter  Medications   cephALEXin (KEFLEX) 500 MG capsule    Sig: Take 1 capsule (500 mg total) by mouth 2 (two) times daily.    Dispense:  14 capsule    Refill:  0    Order Specific Question:   Supervising Provider    Answer:   Merrilee Jansky [[id number]]   phenazopyridine (PYRIDIUM) 100 MG tablet    Sig: Take 1 tablet (100 mg total) by mouth 3 (three) times daily as needed for pain.    Dispense:  10 tablet    Refill:  0    Order Specific Question:   Supervising Provider    Answer:   Merrilee Jansky [id number]     *If you need refills on other medications prior to your next appointment,  please contact your pharmacy*  Follow-Up: Call back or seek an in-person evaluation if the symptoms worsen or if the condition fails to improve as anticipated.  [facility] [phone]  Other Instructions  Urinary Tract Infection, Adult  A urinary tract infection (UTI) is an infection of any part of the urinary tract. The urinary tract includes the kidneys, ureters, bladder, and urethra. These organs make, store, and get rid of urine in the body. An upper UTI affects the ureters and kidneys. A lower UTI affects the bladder and urethra. What are the causes? Most urinary tract infections are caused by bacteria in your genital area around your urethra, where urine leaves your body. These bacteria grow  and cause inflammation of your urinary tract. What increases the risk? You are more likely to develop this condition if: You have a urinary catheter that stays in place. You are not able to control when you urinate or have a bowel movement (incontinence). You are female and you: Use a spermicide or diaphragm for birth control. Have low estrogen levels. Are pregnant. You have certain genes that increase your risk. You are sexually active. You take antibiotic medicines. You have a condition that causes your flow of urine to slow down, such as: An enlarged prostate, if you are female. Blockage in your urethra. A kidney stone. A nerve condition that affects your bladder control (neurogenic bladder). Not getting enough to drink, or not urinating often. You have certain medical conditions, such as: Diabetes. A weak disease-fighting system (immunesystem). Sickle cell disease. Gout. Spinal cord injury. What are the signs or symptoms? Symptoms of this condition include: Needing to urinate right away (urgency). Frequent urination. This may include small amounts of urine each time you urinate. Pain or burning with urination. Blood in the urine. Urine that smells bad or unusual. Trouble urinating. Cloudy urine. Vaginal discharge, if you are female. Pain in the abdomen or the lower back. You may also have: Vomiting or a decreased appetite. Confusion. Irritability or tiredness. A fever or chills. Diarrhea. The first symptom in older adults may be confusion. In some cases, they may not have any symptoms until the infection has worsened. How is this diagnosed? This condition is diagnosed based on your medical history and a physical exam. You may also have other tests, including: Urine tests. Blood tests. Tests for STIs (sexually transmitted infections). If you have had more than one UTI, a cystoscopy or imaging studies may be done to determine the cause of the infections. How is this  treated? Treatment for this condition includes: Antibiotic medicine. Over-the-counter medicines to treat discomfort. Drinking enough water to stay hydrated. If you have frequent infections or have other conditions such as a kidney stone, you may need to see a health care provider who specializes in the urinary tract (urologist). In rare cases, urinary tract infections can cause sepsis. Sepsis is a life-threatening condition that occurs when the body responds to an infection. Sepsis is treated in the hospital with IV antibiotics, fluids, and other medicines. Follow these instructions at home:  Medicines Take over-the-counter and prescription medicines only as told by your health care provider. If you were prescribed an antibiotic medicine, take it as told by your health care provider. Do not stop using the antibiotic even if you start to feel better. General instructions Make sure you: Empty your bladder often and completely. Do not hold urine for long periods of time. Empty your bladder after sex. Wipe from front to back after  urinating or having a bowel movement if you are female. Use each tissue only one time when you wipe. Drink enough fluid to keep your urine pale yellow. Keep all follow-up visits. This is important. Contact a health care provider if: Your symptoms do not get better after 1-2 days. Your symptoms go away and then return. Get help right away if: You have severe pain in your back or your lower abdomen. You have a fever or chills. You have nausea or vomiting. Summary A urinary tract infection (UTI) is an infection of any part of the urinary tract, which includes the kidneys, ureters, bladder, and urethra. Most urinary tract infections are caused by bacteria in your genital area. Treatment for this condition often includes antibiotic medicines. If you were prescribed an antibiotic medicine, take it as told by your health care provider. Do not stop using the antibiotic  even if you start to feel better. Keep all follow-up visits. This is important. This information is not intended to replace advice given to you by your health care provider. Make sure you discuss any questions you have with your health care provider. Document Revised: 06/05/2020 Document Reviewed: 06/10/2020 Elsevier Patient Education  2024 Elsevier Inc.   Cholelithiasis  Cholelithiasis is a disease in which gallstones form in the gallbladder. The gallbladder is an organ that stores bile. Bile is a fluid that helps to digest fats. Gallstones begin as small crystals and can slowly grow into stones. They may cause no symptoms until they block the gallbladder duct, or cystic duct, when the gallbladder tightens, or contracts, after food is eaten. This can cause pain and is known as a gallbladder attack, or biliary colic. There are two main types of gallstones: Cholesterol stones. These are the most common type of gallstone. These stones are made of hardened cholesterol and are usually yellow-green in color. Pigment stones. These are dark in color and are made of a red-yellow substance, called bilirubin,that forms when hemoglobin from red blood cells breaks down. What are the causes? This condition may be caused by too little or too much of the substances that are in bile. This can happen if the bile: Has too much bilirubin. This can happen in certain blood diseases, such as sickle cell anemia. Has too much cholesterol. Does not have enough bile salts. These salts help the body absorb and digest fats. It can also happen if the gallbladder is not emptying completely. This is common during pregnancy. What increases the risk? The following factors may make you more likely to develop this condition: Being older than 62 years of age. Eating a diet that is heavy in fried foods, fat, and refined carbohydrates, such as white bread and white rice. Being female. Having multiple pregnancies. Using  medicines that contain female hormones (estrogen) for a long time. Having certain medical problems, such as: Diabetes mellitus. Obesity. Cystic fibrosis. Crohn's disease. Cirrhosis or other long-term (chronic) liver disease. Certain blood diseases, such as sickle cell anemia or leukemia. Having a family history of gallstones. Losing weight quickly. What are the signs or symptoms? In many cases, having gallstones causes no symptoms. These are called silent gallstones. If a gallstone blocks your bile duct, it can cause a gallbladder attack. The main symptom of a gallbladder attack is sudden pain in the upper right part of the abdomen. The pain: Usually comes at night or after eating. Can last for one hour or more. Can spread to your right shoulder, back, or chest. Can feel like indigestion.  This is discomfort, burning, or fullness in your upper abdomen. If the bile duct is blocked for more than a few hours, it can cause an infection or inflammation of your gallbladder (cholecystitis), liver, or pancreas. This can cause: Nausea or vomiting. Bloating. Pain in your abdomen that lasts for 5 hours or longer. Tenderness in your upper abdomen, often in the upper right section and under your rib cage. Fever or chills. Skin or the white parts of your eyes turning yellow (jaundice). This usually happens when a stone has blocked bile from passing through the bile duct. Dark pee (urine) or light-colored poop (stools). How is this diagnosed? This condition may be diagnosed based on: A physical exam. Your medical history. Ultrasound. CT scan. MRI. You may also have other tests, including: Blood tests to check for infection or inflammation. The HIDA scan to see the gallbladder and the bile ducts. An endoscope to check for blockage in the bile ducts. How is this treated? Treatment depends on the severity of your symptoms. Silent gallstones do not need treatment. You may need treatment if a blockage  causes a gallbladder attack or other symptoms. Treatment may include: If symptoms are mild, you may care for yourself at home. For mild symptoms: Stop eating and drinking for 12-24 hours. You may drink water and clear liquids. This helps to "cool down" your gallbladder. After 1 or 2 days, eat a diet of simple or clear foods, such as broths and crackers. Take medicines for pain or nausea. Take antibiotics if you have an infection. If symptoms are severe, you may: Stay in the hospital for pain control or to treat severe infection. Have surgery to remove the gallbladder (cholecystectomy). This is the most common treatment if all other treatments have not worked. Take medicines to break up gallstones. Medicines may be used for up to 6-12 months. Have an procedure to capture and remove gallstones. Follow these instructions at home: Medicines Take over-the-counter and prescription medicines only as told by your health care provider. If you were prescribed antibiotics, take them as told by your provider. Do not stop using the antibiotic even if you start to feel better. Ask your provider if the medicine prescribed to you requires you to avoid driving or using machinery. Eating and drinking Drink enough fluid to keep your pee pale yellow. This is important during a gallbladder attack. Water and clear liquids are preferred. Follow a healthy diet. This includes: Reducing fatty foods, such as fried food and foods high in cholesterol. Reducing refined carbohydrates, such as white bread and white rice. Eating more fiber. Aim for foods such as almonds, fruit, and beans. General instructions Do not use any products that contain nicotine or tobacco. These products include cigarettes, chewing tobacco, and vaping devices, such as e-cigarettes. If you need help quitting, ask your provider. Maintain a healthy weight. Keep all follow-up visits. These may include seeing a specialist or a Careers adviser. Where to find  more information [employer] of Diabetes and Digestive and Kidney Diseases: [email or web address] Contact a health care provider if: You think you have had a gallbladder attack. You have been diagnosed with silent gallstones and you develop indigestion or pain in your abdomen. You have pain from a gallbladder attack that lasts for more than 2 hours. You begin to have attacks more often. You have nausea. You have dark pee or light-colored poop. Get help right away if: You have pain in your abdomen that lasts for more than 5 hours or is getting worse.  You have a fever or chills. You have vomiting that does not go away. You develop jaundice. This information is not intended to replace advice given to you by your health care provider. Make sure you discuss any questions you have with your health care provider. Document Revised: 08/13/2022 Document Reviewed: 08/13/2022 Elsevier Patient Education  2024 Elsevier Inc.    If you have been instructed to have an in-person evaluation today at a local Urgent Care facility, please use the link below. It will take you to a list of all of our available [facility], including address, phone number and hours of operation. Please do not delay care.  [facility]  If you or a family member do not have a primary care provider, use the link below to schedule a visit and establish care. When you choose a [facility] primary care physician or advanced practice provider, you gain a long-term partner in health. Find a Primary Care Provider  Learn more about [facility]'s in-office and virtual care options: [facility] - Get Care Now

## 2023-07-05 NOTE — Progress Notes (Signed)
Virtual Visit Consent   Patricia Rollins, you are scheduled for a virtual visit with a [facility] provider today. Just as with appointments in the office, your consent must be obtained to participate. Your consent will be active for this visit and any virtual visit you may have with one of our providers in the next 365 days. If you have a MyChart account, a copy of this consent can be sent to you electronically.  As this is a virtual visit, video technology does not allow for your provider to perform a traditional examination. This may limit your provider's ability to fully assess your condition. If your provider identifies any concerns that need to be evaluated in person or the need to arrange testing (such as labs, EKG, etc.), we will make arrangements to do so. Although advances in technology are sophisticated, we cannot ensure that it will always work on either your end or our end. If the connection with a video visit is poor, the visit may have to be switched to a telephone visit. With either a video or telephone visit, we are not always able to ensure that we have a secure connection.  By engaging in this virtual visit, you consent to the provision of healthcare and authorize for your insurance to be billed (if applicable) for the services provided during this visit. Depending on your insurance coverage, you may receive a charge related to this service.  I need to obtain your verbal consent now. Are you willing to proceed with your visit today? [name] has provided verbal consent on 07/05/2023 for a virtual visit (video or telephone). Margaretann Loveless, PA-C  Date: 07/05/2023 7:31 PM  Virtual Visit via Video Note   I, Margaretann Loveless, connected with  Patricia Rollins  ([id number], 1961/04/21) on 07/05/23 at  7:15 PM EDT by a video-enabled telemedicine application and verified that I am speaking with the correct person using two identifiers.  Location: Patient: Virtual Visit Location  Patient: Home Provider: Virtual Visit Location Provider: Home Office   I discussed the limitations of evaluation and management by telemedicine and the availability of in person appointments. The patient expressed understanding and agreed to proceed.    History of Present Illness: Patricia Rollins is a 62 y.o. who identifies as a female who was assigned female at birth, and is being seen today for UTI symptoms.  HPI: Urinary Tract Infection  This is a new problem. The current episode started 1 to 4 weeks ago (4 weeks, 2 weeks became more constant). The problem occurs intermittently. The problem has been gradually worsening. The quality of the pain is described as burning. The pain is moderate. There has been no fever. Associated symptoms include frequency, hesitancy, nausea and urgency. Pertinent negatives include no chills, discharge, flank pain, hematuria or vomiting. She has tried nothing for the symptoms. The treatment provided no relief. Her past medical history is significant for recurrent UTIs.     Problems:  Patient Active Problem List   Diagnosis Date Noted   No-show for appointment 09/27/2020   Incisional hernia, without obstruction or gangrene 01/07/2020   Vaginal atrophy 06/28/2017   Coronary artery disease involving native coronary artery of native heart 03/06/2017   Stable angina pectoris 03/06/2017   Renal infarct (HCC) 02/26/2017   Abdominal pain 02/26/2017   Midline thoracic back pain 02/01/2017   LUQ abdominal tenderness 01/03/2017   Renal lesion 01/03/2017   Spinal stenosis of lumbar region 12/25/2016   Epigastric abdominal  pain 11/21/2016   Constipation 10/23/2016   Lipoma of chest wall 10/23/2016   Coronary artery disease involving coronary bypass graft of native heart without angina pectoris 09/13/2016   Aortic atherosclerosis (HCC) 09/13/2016   PAD (peripheral artery disease) (HCC) 09/13/2016   Pure hypercholesterolemia 09/13/2016   Marijuana use 08/09/2016    Chest pain of uncertain etiology 07/26/2016   Chronic lower extremity pain (Bilateral) (L>R) 07/26/2016   Chronic upper extremity pain (Bilateral) (L>R) 07/26/2016   Tobacco use disorder 07/25/2016   Cigarette nicotine dependence 07/25/2016   Lumbar facet syndrome (Bilateral) (R>L) 07/25/2016   Chronic sacroiliac joint pain (Bilateral) (R>L) 07/25/2016   Chronic shoulder pain (Left) 07/25/2016   Peripheral neuropathy (HCC) (lower extremity) (Bilateral) (L>R) 07/25/2016   Neuropathic pain 07/25/2016   Musculoskeletal pain 07/25/2016   Chronic neck pain (Bilateral) (L>R) 07/25/2016   Long term current use of opiate analgesic 07/24/2016   Encounter for therapeutic drug level monitoring 07/24/2016   Encounter for pain management planning 07/24/2016   Long term prescription benzodiazepine use 07/24/2016   Chronic hip pain (Location of Primary Source of Pain) (Bilateral) (R>L) 07/24/2016   Annual physical exam 07/18/2016   Colon cancer screening 07/18/2016   Tongue lesion 06/21/2016   Abnormal mammogram 11/08/2015   Trochanteric bursitis (Bilateral) 08/16/2015   Hot flash, menopausal 08/16/2015   Compulsive tobacco user syndrome 08/16/2015   Chronic low back pain (Location of Secondary source of pain) (Bilateral) (R>L) 08/15/2015   CD (contact dermatitis) 08/15/2015   Menopausal and perimenopausal disorder 08/15/2015   Chronic lumbar pain 05/12/2015   Fibromyalgia 05/12/2015   Generalized anxiety disorder 05/12/2015   Chronic pain disorder 07/12/2010   Lumbosacral neuritis 02/23/2010    Allergies:  Allergies  Allergen Reactions   Fluoxetine Other (See Comments)    Confusion    Other Nausea Only and Other (See Comments)    Other reaction(s): Unknown    Sulfa Antibiotics Nausea Only   Sulfasalazine Nausea Only   Medications:  Current Outpatient Medications:    cephALEXin (KEFLEX) 500 MG capsule, Take 1 capsule (500 mg total) by mouth 2 (two) times daily., Disp: 14 capsule, Rfl:  0   phenazopyridine (PYRIDIUM) 100 MG tablet, Take 1 tablet (100 mg total) by mouth 3 (three) times daily as needed for pain., Disp: 10 tablet, Rfl: 0   ALPRAZolam (XANAX) 1 MG tablet, Take 1 mg by mouth 3 (three) times daily. , Disp: , Rfl:    amphetamine-dextroamphetamine (ADDERALL) 10 MG tablet, Take 10 mg by mouth 2 (two) times daily., Disp: , Rfl:    aspirin EC 81 MG tablet, Take 81 mg by mouth daily. , Disp: , Rfl:    Cholecalciferol (VITAMIN D3) 25 MCG (1000 UT) CAPS, Take 1,000 Units by mouth daily. , Disp: , Rfl:    cimetidine (TAGAMET) 200 MG tablet, Take 200 mg by mouth 2 (two) times daily., Disp: , Rfl:    conjugated estrogens (PREMARIN) vaginal cream, Place vaginally 2 (two) times a week., Disp: 30 g, Rfl: 3   estrogens, conjugated, (PREMARIN) 0.625 MG tablet, Take 1 tablet (0.625 mg total) by mouth daily., Disp: 90 tablet, Rfl: 3   gabapentin (NEURONTIN) 300 MG capsule, Take 300 mg by mouth at bedtime., Disp: , Rfl:    loratadine (CLARITIN) 10 MG tablet, Take 10 mg by mouth daily., Disp: , Rfl:    lovastatin (MEVACOR) 40 MG tablet, Take 1 tablet (40 mg total) by mouth at bedtime. Further refills to be authorized at appointment. Thank you!, Disp:  90 tablet, Rfl: 3   methylPREDNISolone (MEDROL DOSEPAK) 4 MG TBPK tablet, 6 day taper; take as directed on package instructions, Disp: 21 tablet, Rfl: 0   NYAMYC powder, APPLY SMALL AMOUNT TOPICALLY EVERY DAY AS NEEDED., Disp: 60 g, Rfl: 1   nystatin (MYCOSTATIN/NYSTOP) powder, Apply 1 Application topically 2 (two) times daily., Disp: 60 g, Rfl: 1   tiZANidine (ZANAFLEX) 4 MG tablet, Take 4 mg by mouth in the morning and at bedtime., Disp: , Rfl:   Observations/Objective: Patient is well-developed, well-nourished in no acute distress.  Resting comfortably at home.  Head is normocephalic, atraumatic.  No labored breathing.  Speech is clear and coherent with logical content.  Patient is alert and oriented at baseline.    Assessment and  Plan: 1. Suspected UTI - cephALEXin (KEFLEX) 500 MG capsule; Take 1 capsule (500 mg total) by mouth 2 (two) times daily.  Dispense: 14 capsule; Refill: 0 - phenazopyridine (PYRIDIUM) 100 MG tablet; Take 1 tablet (100 mg total) by mouth 3 (three) times daily as needed for pain.  Dispense: 10 tablet; Refill: 0  - Worsening symptoms.  - Will treat empirically with Keflex - May use Pyridium for bladder spasms - Continue to push fluids.  - Seek in person evaluation for urine culture if symptoms do not improve or if they worsen.    Follow Up Instructions: I discussed the assessment and treatment plan with the patient. The patient was provided an opportunity to ask questions and all were answered. The patient agreed with the plan and demonstrated an understanding of the instructions.  A copy of instructions were sent to the patient via MyChart unless otherwise noted below.    The patient was advised to call back or seek an in-person evaluation if the symptoms worsen or if the condition fails to improve as anticipated.  Time:  I spent 10 minutes with the patient via telehealth technology discussing the above problems/concerns.    Margaretann Loveless, PA-C

## 2023-07-10 ENCOUNTER — Other Ambulatory Visit: Payer: Self-pay

## 2023-07-11 ENCOUNTER — Other Ambulatory Visit: Payer: Self-pay

## 2023-07-11 ENCOUNTER — Encounter: Payer: Self-pay | Admitting: Obstetrics and Gynecology

## 2023-07-12 ENCOUNTER — Ambulatory Visit: Payer: Medicaid Other | Attending: Obstetrics and Gynecology

## 2023-08-12 ENCOUNTER — Ambulatory Visit: Payer: Medicaid Other | Attending: Obstetrics and Gynecology

## 2023-09-28 ENCOUNTER — Telehealth: Payer: Medicaid Other | Admitting: Nurse Practitioner

## 2023-09-28 DIAGNOSIS — R399 Unspecified symptoms and signs involving the genitourinary system: Secondary | ICD-10-CM

## 2023-09-28 NOTE — Progress Notes (Signed)
Virtual Visit Consent   Patricia Rollins, you are scheduled for a virtual visit with a [facility] provider today. Just as with appointments in the office, your consent must be obtained to participate. Your consent will be active for this visit and any virtual visit you may have with one of our providers in the next 365 days. If you have a MyChart account, a copy of this consent can be sent to you electronically.  As this is a virtual visit, video technology does not allow for your provider to perform a traditional examination. This may limit your provider's ability to fully assess your condition. If your provider identifies any concerns that need to be evaluated in person or the need to arrange testing (such as labs, EKG, etc.), we will make arrangements to do so. Although advances in technology are sophisticated, we cannot ensure that it will always work on either your end or our end. If the connection with a video visit is poor, the visit may have to be switched to a telephone visit. With either a video or telephone visit, we are not always able to ensure that we have a secure connection.  By engaging in this virtual visit, you consent to the provision of healthcare and authorize for your insurance to be billed (if applicable) for the services provided during this visit. Depending on your insurance coverage, you may receive a charge related to this service.  I need to obtain your verbal consent now. Are you willing to proceed with your visit today? Patricia Rollins has provided verbal consent on 09/28/2023 for a virtual visit (video or telephone). Patricia Rigg, NP  Date: 09/28/2023 5:48 PM  Virtual Visit via Video Note   I, Patricia Rollins, connected with  Patricia Rollins  ([id number], 09-23-1961) on 09/28/23 at  5:15 PM EST by a video-enabled telemedicine application and verified that I am speaking with the correct person using two identifiers.  Location: Patient: Virtual Visit Location Patient:  Home Provider: Virtual Visit Location Provider: Home Office   I discussed the limitations of evaluation and management by telemedicine and the availability of in person appointments. The patient expressed understanding and agreed to proceed.    History of Present Illness: Patricia Rollins is a 62 y.o. who identifies as a female who was assigned female at birth, and is being seen today for GU problem   Ms. Runion notes urinary hesitancy and feeling like something is stuck between her vagina and rectum. She is currently seeing OB GYN for this. She has been contacted to get her pelvic US scheduled based on  mychart messages however she has not called back to schedule. I informed her today that this needs to be done in order to help diagnose her current symptoms. She states she believes it is gallstones and she passes them every morning in her stools. She does have a history of hemorrhoid based on recent OB gyn notes. It was very hard to keep her on track today with trying to gain detailed HPI as she was hyperfocused on gallbladder stones coming out in her urine and stool. States she was prescribed abx in August for UTI and her symptoms with her gallbladder have not improved.      Problems:  Patient Active Problem List   Diagnosis Date Noted   No-show for appointment 09/27/2020   Incisional hernia, without obstruction or gangrene 01/07/2020   Vaginal atrophy 06/28/2017   Coronary artery disease involving native coronary artery of  native heart 03/06/2017   Stable angina pectoris (HCC) 03/06/2017   Renal infarct (HCC) 02/26/2017   Abdominal pain 02/26/2017   Midline thoracic back pain 02/01/2017   LUQ abdominal tenderness 01/03/2017   Renal lesion 01/03/2017   Spinal stenosis of lumbar region 12/25/2016   Epigastric abdominal pain 11/21/2016   Constipation 10/23/2016   Lipoma of chest wall 10/23/2016   Coronary artery disease involving coronary bypass graft of native heart without angina  pectoris 09/13/2016   Aortic atherosclerosis (HCC) 09/13/2016   PAD (peripheral artery disease) (HCC) 09/13/2016   Pure hypercholesterolemia 09/13/2016   Marijuana use 08/09/2016   Chest pain of uncertain etiology 07/26/2016   Chronic lower extremity pain (Bilateral) (L>R) 07/26/2016   Chronic upper extremity pain (Bilateral) (L>R) 07/26/2016   Tobacco use disorder 07/25/2016   Cigarette nicotine dependence 07/25/2016   Lumbar facet syndrome (Bilateral) (R>L) 07/25/2016   Chronic sacroiliac joint pain (Bilateral) (R>L) 07/25/2016   Chronic shoulder pain (Left) 07/25/2016   Peripheral neuropathy (HCC) (lower extremity) (Bilateral) (L>R) 07/25/2016   Neuropathic pain 07/25/2016   Musculoskeletal pain 07/25/2016   Chronic neck pain (Bilateral) (L>R) 07/25/2016   Long term current use of opiate analgesic 07/24/2016   Encounter for therapeutic drug level monitoring 07/24/2016   Encounter for pain management planning 07/24/2016   Long term prescription benzodiazepine use 07/24/2016   Chronic hip pain (Location of Primary Source of Pain) (Bilateral) (R>L) 07/24/2016   Annual physical exam 07/18/2016   Colon cancer screening 07/18/2016   Tongue lesion 06/21/2016   Abnormal mammogram 11/08/2015   Trochanteric bursitis (Bilateral) 08/16/2015   Hot flash, menopausal 08/16/2015   Compulsive tobacco user syndrome 08/16/2015   Chronic low back pain (Location of Secondary source of pain) (Bilateral) (R>L) 08/15/2015   CD (contact dermatitis) 08/15/2015   Menopausal and perimenopausal disorder 08/15/2015   Chronic lumbar pain 05/12/2015   Fibromyalgia 05/12/2015   Generalized anxiety disorder 05/12/2015   Chronic pain disorder 07/12/2010   Lumbosacral neuritis 02/23/2010    Allergies:  Allergies  Allergen Reactions   Fluoxetine Other (See Comments)    Confusion    Other Nausea Only and Other (See Comments)    Other reaction(s): Unknown    Sulfa Antibiotics Nausea Only   Sulfasalazine  Nausea Only   Medications:  Current Outpatient Medications:    ALPRAZolam (XANAX) 1 MG tablet, Take 1 mg by mouth 3 (three) times daily. , Disp: , Rfl:    amphetamine-dextroamphetamine (ADDERALL) 10 MG tablet, Take 10 mg by mouth 2 (two) times daily., Disp: , Rfl:    aspirin EC 81 MG tablet, Take 81 mg by mouth daily. , Disp: , Rfl:    cephALEXin (KEFLEX) 500 MG capsule, Take 1 capsule (500 mg total) by mouth 2 (two) times daily., Disp: 14 capsule, Rfl: 0   Cholecalciferol (VITAMIN D3) 25 MCG (1000 UT) CAPS, Take 1,000 Units by mouth daily. , Disp: , Rfl:    cimetidine (TAGAMET) 200 MG tablet, Take 200 mg by mouth 2 (two) times daily., Disp: , Rfl:    conjugated estrogens (PREMARIN) vaginal cream, Place vaginally 2 (two) times a week., Disp: 30 g, Rfl: 3   estrogens, conjugated, (PREMARIN) 0.625 MG tablet, TAKE 1 TABLET BY MOUTH DAILY, Disp: 90 tablet, Rfl: 0   gabapentin (NEURONTIN) 300 MG capsule, Take 300 mg by mouth at bedtime., Disp: , Rfl:    loratadine (CLARITIN) 10 MG tablet, Take 10 mg by mouth daily., Disp: , Rfl:    lovastatin (MEVACOR) 40 MG tablet,  Take 1 tablet (40 mg total) by mouth at bedtime. Further refills to be authorized at appointment. Thank you!, Disp: 90 tablet, Rfl: 3   methylPREDNISolone (MEDROL DOSEPAK) 4 MG TBPK tablet, 6 day taper; take as directed on package instructions, Disp: 21 tablet, Rfl: 0   NYAMYC powder, APPLY SMALL AMOUNT TOPICALLY EVERY DAY AS NEEDED., Disp: 60 g, Rfl: 1   nystatin (MYCOSTATIN/NYSTOP) powder, Apply 1 Application topically 2 (two) times daily., Disp: 60 g, Rfl: 1   phenazopyridine (PYRIDIUM) 100 MG tablet, Take 1 tablet (100 mg total) by mouth 3 (three) times daily as needed for pain., Disp: 10 tablet, Rfl: 0   tiZANidine (ZANAFLEX) 4 MG tablet, Take 4 mg by mouth in the morning and at bedtime., Disp: , Rfl:   Observations/Objective: Patient is well-developed, well-nourished in no acute distress.  Resting comfortably  at home.  Head is  normocephalic, atraumatic.  No labored breathing.  Speech is clear and coherent with logical content.  Patient is alert and oriented at baseline.    Assessment and Plan: 1. Genitourinary complaints  Follow up with OBGYN. Scheduled Pelvic US  Follow Up Instructions: I discussed the assessment and treatment plan with the patient. The patient was provided an opportunity to ask questions and all were answered. The patient agreed with the plan and demonstrated an understanding of the instructions.  A copy of instructions were sent to the patient via MyChart unless otherwise noted below.    The patient was advised to call back or seek an in-person evaluation if the symptoms worsen or if the condition fails to improve as anticipated.    Patricia Rigg, NP

## 2023-09-28 NOTE — Patient Instructions (Signed)
[name], thank you for joining Claiborne Rigg, NP for today's virtual visit.  While this provider is not your primary care provider (PCP), if your PCP is located in our provider database this encounter information will be shared with them immediately following your visit.   A [facility] MyChart account gives you access to today's visit and all your visits, tests, and labs performed at [facility] " click here if you don't have a [facility] MyChart account or go to [email or web address]  Consent: (Patient) Patricia Rollins provided verbal consent for this virtual visit at the beginning of the encounter.  Current Medications:  Current Outpatient Medications:    ALPRAZolam (XANAX) 1 MG tablet, Take 1 mg by mouth 3 (three) times daily. , Disp: , Rfl:    amphetamine-dextroamphetamine (ADDERALL) 10 MG tablet, Take 10 mg by mouth 2 (two) times daily., Disp: , Rfl:    aspirin EC 81 MG tablet, Take 81 mg by mouth daily. , Disp: , Rfl:    cephALEXin (KEFLEX) 500 MG capsule, Take 1 capsule (500 mg total) by mouth 2 (two) times daily., Disp: 14 capsule, Rfl: 0   Cholecalciferol (VITAMIN D3) 25 MCG (1000 UT) CAPS, Take 1,000 Units by mouth daily. , Disp: , Rfl:    cimetidine (TAGAMET) 200 MG tablet, Take 200 mg by mouth 2 (two) times daily., Disp: , Rfl:    conjugated estrogens (PREMARIN) vaginal cream, Place vaginally 2 (two) times a week., Disp: 30 g, Rfl: 3   estrogens, conjugated, (PREMARIN) 0.625 MG tablet, TAKE 1 TABLET BY MOUTH DAILY, Disp: 90 tablet, Rfl: 0   gabapentin (NEURONTIN) 300 MG capsule, Take 300 mg by mouth at bedtime., Disp: , Rfl:    loratadine (CLARITIN) 10 MG tablet, Take 10 mg by mouth daily., Disp: , Rfl:    lovastatin (MEVACOR) 40 MG tablet, Take 1 tablet (40 mg total) by mouth at bedtime. Further refills to be authorized at appointment. Thank you!, Disp: 90 tablet, Rfl: 3   methylPREDNISolone (MEDROL DOSEPAK) 4 MG TBPK tablet, 6 day taper; take as directed  on package instructions, Disp: 21 tablet, Rfl: 0   NYAMYC powder, APPLY SMALL AMOUNT TOPICALLY EVERY DAY AS NEEDED., Disp: 60 g, Rfl: 1   nystatin (MYCOSTATIN/NYSTOP) powder, Apply 1 Application topically 2 (two) times daily., Disp: 60 g, Rfl: 1   phenazopyridine (PYRIDIUM) 100 MG tablet, Take 1 tablet (100 mg total) by mouth 3 (three) times daily as needed for pain., Disp: 10 tablet, Rfl: 0   tiZANidine (ZANAFLEX) 4 MG tablet, Take 4 mg by mouth in the morning and at bedtime., Disp: , Rfl:    Medications ordered in this encounter:  No orders of the defined types were placed in this encounter.    *If you need refills on other medications prior to your next appointment, please contact your pharmacy*  Follow-Up: Call back or seek an in-person evaluation if the symptoms worsen or if the condition fails to improve as anticipated.  [facility] [phone]  Other Instructions Follow up with OBGYN. Scheduled Pelvic US   If you have been instructed to have an in-person evaluation today at a local Urgent Care facility, please use the link below. It will take you to a list of all of our available [facility], including address, phone number and hours of operation. Please do not delay care.  [facility]  If you or a family member do not have a primary care provider,  use the link below to schedule a visit and establish care. When you choose a [facility] primary care physician or advanced practice provider, you gain a long-term partner in health. Find a Primary Care Provider  Learn more about [facility]'s in-office and virtual care options: [facility] Get Care Now

## 2023-10-11 ENCOUNTER — Ambulatory Visit: Admission: RE | Admit: 2023-10-11 | Payer: Medicaid Other | Source: Ambulatory Visit

## 2023-10-31 ENCOUNTER — Ambulatory Visit: Admission: RE | Admit: 2023-10-31 | Payer: Medicaid Other | Source: Ambulatory Visit

## 2023-11-02 ENCOUNTER — Other Ambulatory Visit: Payer: Self-pay | Admitting: Obstetrics and Gynecology

## 2024-02-12 ENCOUNTER — Telehealth: Admitting: Family Medicine

## 2024-02-12 DIAGNOSIS — R3989 Other symptoms and signs involving the genitourinary system: Secondary | ICD-10-CM

## 2024-02-12 MED ORDER — CEPHALEXIN 500 MG PO CAPS
500.0000 mg | ORAL_CAPSULE | Freq: Two times a day (BID) | ORAL | 0 refills | Status: AC
Start: 2024-02-12 — End: 2024-02-19

## 2024-02-12 NOTE — Progress Notes (Signed)
 Virtual Visit Consent   Patricia Rollins, you are scheduled for a virtual visit with a  provider today. Just as with appointments in the office, your consent must be obtained to participate. Your consent will be active for this visit and any virtual visit you may have with one of our providers in the next 365 days. If you have a MyChart account, a copy of this consent can be sent to you electronically.  As this is a virtual visit, video technology does not allow for your provider to perform a traditional examination. This may limit your provider's ability to fully assess your condition. If your provider identifies any concerns that need to be evaluated in person or the need to arrange testing (such as labs, EKG, etc.), we will make arrangements to do so. Although advances in technology are sophisticated, we cannot ensure that it will always work on either your end or our end. If the connection with a video visit is poor, the visit may have to be switched to a telephone visit. With either a video or telephone visit, we are not always able to ensure that we have a secure connection.  By engaging in this virtual visit, you consent to the provision of healthcare and authorize for your insurance to be billed (if applicable) for the services provided during this visit. Depending on your insurance coverage, you may receive a charge related to this service.  I need to obtain your verbal consent now. Are you willing to proceed with your visit today? Patricia Rollins has provided verbal consent on 02/12/2024 for a virtual visit (video or telephone). [name], NP  Date: 02/12/2024 1:59 PM   Virtual Visit via Video Note   I, [name], connected with  Patricia Rollins  ([id number], 1961-07-02) on 02/12/24 at  2:45 PM EDT by a video-enabled telemedicine application and verified that I am speaking with the correct person using two identifiers.  Location: Patient: Virtual Visit Location Patient:  Home Provider: Virtual Visit Location Provider: Home Office   I discussed the limitations of evaluation and management by telemedicine and the availability of in person appointments. The patient expressed understanding and agreed to proceed.    History of Present Illness: Patricia Rollins is a 63 y.o. who identifies as a female who was assigned female at birth, and is being seen today for UTI.  Onset was noted over a week ago- notes like she is feeling the sensation, but unable to urinate, some burning at times.  Associated symptoms are as stated above Modifying factors are baking soda and water- 3 times daily, probiotics Denies chest pain, shortness of breath, fevers, chills, n/v   Problems:  Patient Active Problem List   Diagnosis Date Noted   No-show for appointment 09/27/2020   Incisional hernia, without obstruction or gangrene 01/07/2020   Vaginal atrophy 06/28/2017   Coronary artery disease involving native coronary artery of native heart 03/06/2017   Stable angina pectoris (HCC) 03/06/2017   Renal infarct (HCC) 02/26/2017   Abdominal pain 02/26/2017   Midline thoracic back pain 02/01/2017   LUQ abdominal tenderness 01/03/2017   Renal lesion 01/03/2017   Spinal stenosis of lumbar region 12/25/2016   Epigastric abdominal pain 11/21/2016   Constipation 10/23/2016   Lipoma of chest wall 10/23/2016   Coronary artery disease involving coronary bypass graft of native heart without angina pectoris 09/13/2016   Aortic atherosclerosis (HCC) 09/13/2016   PAD (peripheral artery disease) (HCC) 09/13/2016   Pure  hypercholesterolemia 09/13/2016   Marijuana use 08/09/2016   Chest pain of uncertain etiology 07/26/2016   Chronic lower extremity pain (Bilateral) (L>R) 07/26/2016   Chronic upper extremity pain (Bilateral) (L>R) 07/26/2016   Tobacco use disorder 07/25/2016   Cigarette nicotine dependence 07/25/2016   Lumbar facet syndrome (Bilateral) (R>L) 07/25/2016   Chronic sacroiliac  joint pain (Bilateral) (R>L) 07/25/2016   Chronic shoulder pain (Left) 07/25/2016   Peripheral neuropathy (HCC) (lower extremity) (Bilateral) (L>R) 07/25/2016   Neuropathic pain 07/25/2016   Musculoskeletal pain 07/25/2016   Chronic neck pain (Bilateral) (L>R) 07/25/2016   Long term current use of opiate analgesic 07/24/2016   Encounter for therapeutic drug level monitoring 07/24/2016   Encounter for pain management planning 07/24/2016   Long term prescription benzodiazepine use 07/24/2016   Chronic hip pain (Location of Primary Source of Pain) (Bilateral) (R>L) 07/24/2016   Annual physical exam 07/18/2016   Colon cancer screening 07/18/2016   Tongue lesion 06/21/2016   Abnormal mammogram 11/08/2015   Trochanteric bursitis (Bilateral) 08/16/2015   Hot flash, menopausal 08/16/2015   Compulsive tobacco user syndrome 08/16/2015   Chronic low back pain (Location of Secondary source of pain) (Bilateral) (R>L) 08/15/2015   CD (contact dermatitis) 08/15/2015   Menopausal and perimenopausal disorder 08/15/2015   Chronic lumbar pain 05/12/2015   Fibromyalgia 05/12/2015   Generalized anxiety disorder 05/12/2015   Chronic pain disorder 07/12/2010   Lumbosacral neuritis 02/23/2010    Allergies:  Allergies  Allergen Reactions   Fluoxetine Other (See Comments)    Confusion    Other Nausea Only and Other (See Comments)    Other reaction(s): Unknown    Sulfa Antibiotics Nausea Only   Sulfasalazine Nausea Only   Medications:  Current Outpatient Medications:    ALPRAZolam (XANAX) 1 MG tablet, Take 1 mg by mouth 3 (three) times daily. , Disp: , Rfl:    amphetamine-dextroamphetamine (ADDERALL) 10 MG tablet, Take 10 mg by mouth 2 (two) times daily., Disp: , Rfl:    aspirin EC 81 MG tablet, Take 81 mg by mouth daily. , Disp: , Rfl:    cephALEXin (KEFLEX) 500 MG capsule, Take 1 capsule (500 mg total) by mouth 2 (two) times daily., Disp: 14 capsule, Rfl: 0   Cholecalciferol (VITAMIN D3) 25 MCG  (1000 UT) CAPS, Take 1,000 Units by mouth daily. , Disp: , Rfl:    cimetidine (TAGAMET) 200 MG tablet, Take 200 mg by mouth 2 (two) times daily., Disp: , Rfl:    conjugated estrogens (PREMARIN) vaginal cream, Place vaginally 2 (two) times a week., Disp: 30 g, Rfl: 3   gabapentin (NEURONTIN) 300 MG capsule, Take 300 mg by mouth at bedtime., Disp: , Rfl:    loratadine (CLARITIN) 10 MG tablet, Take 10 mg by mouth daily., Disp: , Rfl:    lovastatin (MEVACOR) 40 MG tablet, Take 1 tablet (40 mg total) by mouth at bedtime. Further refills to be authorized at appointment. Thank you!, Disp: 90 tablet, Rfl: 3   methylPREDNISolone (MEDROL DOSEPAK) 4 MG TBPK tablet, 6 day taper; take as directed on package instructions, Disp: 21 tablet, Rfl: 0   NYAMYC powder, APPLY SMALL AMOUNT TOPICALLY EVERY DAY AS NEEDED., Disp: 60 g, Rfl: 1   nystatin (MYCOSTATIN/NYSTOP) powder, Apply 1 Application topically 2 (two) times daily., Disp: 60 g, Rfl: 1   phenazopyridine (PYRIDIUM) 100 MG tablet, Take 1 tablet (100 mg total) by mouth 3 (three) times daily as needed for pain., Disp: 10 tablet, Rfl: 0   PREMARIN 0.625 MG tablet,  TAKE 1 TABLET BY MOUTH DAILY, Disp: 90 tablet, Rfl: 0   tiZANidine (ZANAFLEX) 4 MG tablet, Take 4 mg by mouth in the morning and at bedtime., Disp: , Rfl:   Observations/Objective: Patient is well-developed, well-nourished in no acute distress.  Resting comfortably at home.  Head is normocephalic, atraumatic.  No labored breathing.  Speech is clear and coherent with logical content.  Patient is alert and oriented at baseline.    Assessment and Plan:  1. Suspected UTI (Primary)  - cephALEXin (KEFLEX) 500 MG capsule; Take 1 capsule (500 mg total) by mouth 2 (two) times daily for 7 days.  Dispense: 14 capsule; Refill: 0   -no other red flags for stone or kidney infection -increase fluids -complete medication as discussed -prevention discussed and on AVS  Reviewed side effects, risks and  benefits of medication.    Patient acknowledged agreement and understanding of the plan.   Past Medical, Surgical, Social History, Allergies, and Medications have been Reviewed.     Follow Up Instructions: I discussed the assessment and treatment plan with the patient. The patient was provided an opportunity to ask questions and all were answered. The patient agreed with the plan and demonstrated an understanding of the instructions.  A copy of instructions were sent to the patient via MyChart unless otherwise noted below.    The patient was advised to call back or seek an in-person evaluation if the symptoms worsen or if the condition fails to improve as anticipated.    [name], NP

## 2024-02-12 NOTE — Patient Instructions (Addendum)
 [name], thank you for joining Patricia Finner, NP for today's virtual visit.  While this provider is not your primary care provider (PCP), if your PCP is located in our provider database this encounter information will be shared with them immediately following your visit.   A [facility] MyChart account gives you access to today's visit and all your visits, tests, and labs performed at [facility] " click here if you don't have a [facility] MyChart account or go to [email or web address]  Consent: (Patient) Patricia Rollins provided verbal consent for this virtual visit at the beginning of the encounter.  Current Medications:  Current Outpatient Medications:    cephALEXin (KEFLEX) 500 MG capsule, Take 1 capsule (500 mg total) by mouth 2 (two) times daily for 7 days., Disp: 14 capsule, Rfl: 0   ALPRAZolam (XANAX) 1 MG tablet, Take 1 mg by mouth 3 (three) times daily. , Disp: , Rfl:    amphetamine-dextroamphetamine (ADDERALL) 10 MG tablet, Take 10 mg by mouth 2 (two) times daily., Disp: , Rfl:    aspirin EC 81 MG tablet, Take 81 mg by mouth daily. , Disp: , Rfl:    Cholecalciferol (VITAMIN D3) 25 MCG (1000 UT) CAPS, Take 1,000 Units by mouth daily. , Disp: , Rfl:    cimetidine (TAGAMET) 200 MG tablet, Take 200 mg by mouth 2 (two) times daily., Disp: , Rfl:    conjugated estrogens (PREMARIN) vaginal cream, Place vaginally 2 (two) times a week., Disp: 30 g, Rfl: 3   gabapentin (NEURONTIN) 300 MG capsule, Take 300 mg by mouth at bedtime., Disp: , Rfl:    loratadine (CLARITIN) 10 MG tablet, Take 10 mg by mouth daily., Disp: , Rfl:    lovastatin (MEVACOR) 40 MG tablet, Take 1 tablet (40 mg total) by mouth at bedtime. Further refills to be authorized at appointment. Thank you!, Disp: 90 tablet, Rfl: 3   methylPREDNISolone (MEDROL DOSEPAK) 4 MG TBPK tablet, 6 day taper; take as directed on package instructions, Disp: 21 tablet, Rfl: 0   NYAMYC powder, APPLY SMALL AMOUNT TOPICALLY  EVERY DAY AS NEEDED., Disp: 60 g, Rfl: 1   nystatin (MYCOSTATIN/NYSTOP) powder, Apply 1 Application topically 2 (two) times daily., Disp: 60 g, Rfl: 1   phenazopyridine (PYRIDIUM) 100 MG tablet, Take 1 tablet (100 mg total) by mouth 3 (three) times daily as needed for pain., Disp: 10 tablet, Rfl: 0   PREMARIN 0.625 MG tablet, TAKE 1 TABLET BY MOUTH DAILY, Disp: 90 tablet, Rfl: 0   tiZANidine (ZANAFLEX) 4 MG tablet, Take 4 mg by mouth in the morning and at bedtime., Disp: , Rfl:    Medications ordered in this encounter:  Meds ordered this encounter  Medications   cephALEXin (KEFLEX) 500 MG capsule    Sig: Take 1 capsule (500 mg total) by mouth 2 (two) times daily for 7 days.    Dispense:  14 capsule    Refill:  0    Supervising Provider:   Merrilee Jansky [[id number]]     *If you need refills on other medications prior to your next appointment, please contact your pharmacy*  Follow-Up: Call back or seek an in-person evaluation if the symptoms worsen or if the condition fails to improve as anticipated.  [facility] [phone]  Other Instructions  Urinary Tract Infection, Female A urinary tract infection (UTI) is an infection in your urinary tract. The urinary tract is made up of organs that make, store, and get rid of  pee (urine) in your body. These organs include: The kidneys. The ureters. The bladder. The urethra. What are the causes? Most UTIs are caused by germs called bacteria. They may be in or near your genitals. These germs grow and cause swelling in your urinary tract. What increases the risk? You're more likely to get a UTI if: You're a female. The urethra is shorter in females than in males. You have a soft tube called a catheter that drains your pee. You can't control when you pee or poop. You have trouble peeing because of: A kidney stone. A urinary blockage. A nerve condition that affects your bladder. Not getting enough to drink. You're sexually  active. You use a birth control inside your vagina, like spermicide. You're pregnant. You have low levels of the hormone estrogen in your body. You're an older adult. You're also more likely to get a UTI if you have other health problems. These may include: Diabetes. A weak immune system. Your immune system is your body's defense system. Sickle cell disease. Injury of the spine. What are the signs or symptoms? Symptoms may include: Needing to pee right away. Peeing small amounts often. Pain or burning when you pee. Blood in your pee. Pee that smells bad or odd. Pain in your belly or lower back. You may also: Feel confused. This may be the first symptom in older adults. Vomit. Not feel hungry. Feel tired or easily annoyed. Have a fever or chills. How is this diagnosed? A UTI is diagnosed based on your medical history and an exam. You may also have other tests. These may include: Pee tests. Blood tests. Tests for sexually transmitted infections (STIs). If you've had more than one UTI, you may need to have imaging studies done to find out why you keep getting them. How is this treated? A UTI can be treated by: Taking antibiotics or other medicines. Drinking enough fluid to keep your pee pale yellow. In rare cases, a UTI can cause a very bad condition called sepsis. Sepsis may be treated in the hospital. Follow these instructions at home: Medicines Take your medicines only as told by your health care provider. If you were given antibiotics, take them as told by your provider. Do not stop taking them even if you start to feel better. General instructions Make sure you: Pee often and fully. Do not hold your pee for a long time. Wipe from front to back after you pee or poop. Use each tissue only once when you wipe. Pee after you have sex. Do not douche or use sprays or powders in your genital area. Contact a health care provider if: Your symptoms don't get better after 1-2 days  of taking antibiotics. Your symptoms go away and then come back. You have a fever or chills. You vomit or feel like you may vomit. Get help right away if: You have very bad pain in your back or lower belly. You faint. This information is not intended to replace advice given to you by your health care provider. Make sure you discuss any questions you have with your health care provider. Document Revised: 06/06/2023 Document Reviewed: 02/01/2023 Elsevier Patient Education  2024 Elsevier Inc.   If you have been instructed to have an in-person evaluation today at a local Urgent Care facility, please use the link below. It will take you to a list of all of our available [facility], including address, phone number and hours of operation. Please do not delay care.  [facility]  If you or a family member do not have a primary care provider, use the link below to schedule a visit and establish care. When you choose a [facility] primary care physician or advanced practice provider, you gain a long-term partner in health. Find a Primary Care Provider  Learn more about [facility]'s in-office and virtual care options: [facility] - Get Care Now

## 2024-02-25 ENCOUNTER — Ambulatory Visit: Payer: Medicaid Other | Admitting: Cardiovascular Disease

## 2024-04-14 ENCOUNTER — Telehealth: Admitting: Emergency Medicine

## 2024-04-14 DIAGNOSIS — R300 Dysuria: Secondary | ICD-10-CM

## 2024-04-14 NOTE — Patient Instructions (Signed)
 Soundra Duval, thank you for joining Blinda Burger, NP for today's virtual visit.  While this provider is not your primary care provider (PCP), if your PCP is located in our provider database this encounter information will be shared with them immediately following your visit.   A [facility] MyChart account gives you access to today's visit and all your visits, tests, and labs performed at [facility] " click here if you don't have a [facility] MyChart account or go to [email or web address]  Consent: (Patient) Patricia Rollins provided verbal consent for this virtual visit at the beginning of the encounter.  Current Medications:  Current Outpatient Medications:    ALPRAZolam  (XANAX ) 1 MG tablet, Take 1 mg by mouth 3 (three) times daily. , Disp: , Rfl:    amphetamine-dextroamphetamine (ADDERALL) 10 MG tablet, Take 10 mg by mouth 2 (two) times daily., Disp: , Rfl:    aspirin EC 81 MG tablet, Take 81 mg by mouth daily. , Disp: , Rfl:    Cholecalciferol (VITAMIN D3) 25 MCG (1000 UT) CAPS, Take 1,000 Units by mouth daily. , Disp: , Rfl:    cimetidine  (TAGAMET ) 200 MG tablet, Take 200 mg by mouth 2 (two) times daily., Disp: , Rfl:    conjugated estrogens  (PREMARIN ) vaginal cream, Place vaginally 2 (two) times a week., Disp: 30 g, Rfl: 3   gabapentin  (NEURONTIN ) 300 MG capsule, Take 300 mg by mouth at bedtime., Disp: , Rfl:    loratadine (CLARITIN) 10 MG tablet, Take 10 mg by mouth daily., Disp: , Rfl:    lovastatin  (MEVACOR ) 40 MG tablet, Take 1 tablet (40 mg total) by mouth at bedtime. Further refills to be authorized at appointment. Thank you!, Disp: 90 tablet, Rfl: 3   methylPREDNISolone  (MEDROL  DOSEPAK) 4 MG TBPK tablet, 6 day taper; take as directed on package instructions, Disp: 21 tablet, Rfl: 0   NYAMYC  powder, APPLY SMALL AMOUNT TOPICALLY EVERY DAY AS NEEDED., Disp: 60 g, Rfl: 1   nystatin  (MYCOSTATIN /NYSTOP ) powder, Apply 1 Application topically 2 (two) times daily.,  Disp: 60 g, Rfl: 1   phenazopyridine  (PYRIDIUM ) 100 MG tablet, Take 1 tablet (100 mg total) by mouth 3 (three) times daily as needed for pain., Disp: 10 tablet, Rfl: 0   PREMARIN  0.625 MG tablet, TAKE 1 TABLET BY MOUTH DAILY, Disp: 90 tablet, Rfl: 0   tiZANidine  (ZANAFLEX ) 4 MG tablet, Take 4 mg by mouth in the morning and at bedtime., Disp: , Rfl:    Medications ordered in this encounter:  No orders of the defined types were placed in this encounter.    *If you need refills on other medications prior to your next appointment, please contact your pharmacy*  Follow-Up: Call back or seek an in-person evaluation if the symptoms worsen or if the condition fails to improve as anticipated.  [facility] [phone]  Other Instructions  Please be seen in person to get your urine checked for infection and to find out what the true problem is. I am not able to solve this for you by video. You need an in person exam and testing.    If you have been instructed to have an in-person evaluation today at a local Urgent Care facility, please use the link below. It will take you to a list of all of our available [facility], including address, phone number and hours of operation. Please do not delay care.  [facility]  If you or a  family member do not have a primary care provider, use the link below to schedule a visit and establish care. When you choose a [facility] primary care physician or advanced practice provider, you gain a long-term partner in health. Find a Primary Care Provider  Learn more about [facility]'s in-office and virtual care options: [facility] Get Care Now

## 2024-04-14 NOTE — Progress Notes (Signed)
 Virtual Visit Consent   Patricia Rollins, you are scheduled for a virtual visit with a [facility] provider today. Just as with appointments in the office, your consent must be obtained to participate. Your consent will be active for this visit and any virtual visit you may have with one of our providers in the next 365 days. If you have a MyChart account, a copy of this consent can be sent to you electronically.  As this is a virtual visit, video technology does not allow for your provider to perform a traditional examination. This may limit your provider's ability to fully assess your condition. If your provider identifies any concerns that need to be evaluated in person or the need to arrange testing (such as labs, EKG, etc.), we will make arrangements to do so. Although advances in technology are sophisticated, we cannot ensure that it will always work on either your end or our end. If the connection with a video visit is poor, the visit may have to be switched to a telephone visit. With either a video or telephone visit, we are not always able to ensure that we have a secure connection.  By engaging in this virtual visit, you consent to the provision of healthcare and authorize for your insurance to be billed (if applicable) for the services provided during this visit. Depending on your insurance coverage, you may receive a charge related to this service.  I need to obtain your verbal consent now. Are you willing to proceed with your visit today? SYEDA PRICKETT has provided verbal consent on 04/14/2024 for a virtual visit (video or telephone). Blinda Burger, NP  Date: 04/14/2024 11:55 AM   Virtual Visit via Video Note   I, Blinda Burger, connected with  RAMONIA MCCLARAN  ([id number], September 12, 1961) on 04/14/24 at 11:45 AM EDT by a video-enabled telemedicine application and verified that I am speaking with the correct person using two identifiers.  Location: Patient: Virtual Visit Location Patient:  Home Provider: Virtual Visit Location Provider: Home Office   I discussed the limitations of evaluation and management by telemedicine and the availability of in person appointments. The patient expressed understanding and agreed to proceed.    History of Present Illness: Patricia Rollins is a 63 y.o. who identifies as a female who was assigned female at birth, and is being seen today for UTI.   I explained at beginning of visit she would need to be seen in person and offered to cancel appt and she continued to talk with me.   3rd UTI in the past year. Feels antibiotics she has been given don't work well enough. Last treated in April with Keflex . Felt better for 3 weeks, then sx returned. Wants antifungal medicine to treat her uti.     HPI: HPI  Problems:  Patient Active Problem List   Diagnosis Date Noted   No-show for appointment 09/27/2020   Incisional hernia, without obstruction or gangrene 01/07/2020   Vaginal atrophy 06/28/2017   Coronary artery disease involving native coronary artery of native heart 03/06/2017   Stable angina pectoris (HCC) 03/06/2017   Renal infarct (HCC) 02/26/2017   Abdominal pain 02/26/2017   Midline thoracic back pain 02/01/2017   LUQ abdominal tenderness 01/03/2017   Renal lesion 01/03/2017   Spinal stenosis of lumbar region 12/25/2016   Epigastric abdominal pain 11/21/2016   Constipation 10/23/2016   Lipoma of chest wall 10/23/2016   Coronary artery disease involving coronary bypass graft of native heart  without angina pectoris 09/13/2016   Aortic atherosclerosis (HCC) 09/13/2016   PAD (peripheral artery disease) (HCC) 09/13/2016   Pure hypercholesterolemia 09/13/2016   Marijuana use 08/09/2016   Chest pain of uncertain etiology 07/26/2016   Chronic lower extremity pain (Bilateral) (L>R) 07/26/2016   Chronic upper extremity pain (Bilateral) (L>R) 07/26/2016   Tobacco use disorder 07/25/2016   Cigarette nicotine  dependence 07/25/2016   Lumbar  facet syndrome (Bilateral) (R>L) 07/25/2016   Chronic sacroiliac joint pain (Bilateral) (R>L) 07/25/2016   Chronic shoulder pain (Left) 07/25/2016   Peripheral neuropathy (HCC) (lower extremity) (Bilateral) (L>R) 07/25/2016   Neuropathic pain 07/25/2016   Musculoskeletal pain 07/25/2016   Chronic neck pain (Bilateral) (L>R) 07/25/2016   Long term current use of opiate analgesic 07/24/2016   Encounter for therapeutic drug level monitoring 07/24/2016   Encounter for pain management planning 07/24/2016   Long term prescription benzodiazepine use 07/24/2016   Chronic hip pain (Location of Primary Source of Pain) (Bilateral) (R>L) 07/24/2016   Annual physical exam 07/18/2016   Colon cancer screening 07/18/2016   Tongue lesion 06/21/2016   Abnormal mammogram 11/08/2015   Trochanteric bursitis (Bilateral) 08/16/2015   Hot flash, menopausal 08/16/2015   Compulsive tobacco user syndrome 08/16/2015   Chronic low back pain (Location of Secondary source of pain) (Bilateral) (R>L) 08/15/2015   CD (contact dermatitis) 08/15/2015   Menopausal and perimenopausal disorder 08/15/2015   Chronic lumbar pain 05/12/2015   Fibromyalgia 05/12/2015   Generalized anxiety disorder 05/12/2015   Chronic pain disorder 07/12/2010   Lumbosacral neuritis 02/23/2010    Allergies:  Allergies  Allergen Reactions   Fluoxetine Other (See Comments)    Confusion    Other Nausea Only and Other (See Comments)    Other reaction(s): Unknown    Sulfa Antibiotics Nausea Only   Sulfasalazine Nausea Only   Medications:  Current Outpatient Medications:    ALPRAZolam  (XANAX ) 1 MG tablet, Take 1 mg by mouth 3 (three) times daily. , Disp: , Rfl:    amphetamine-dextroamphetamine (ADDERALL) 10 MG tablet, Take 10 mg by mouth 2 (two) times daily., Disp: , Rfl:    aspirin EC 81 MG tablet, Take 81 mg by mouth daily. , Disp: , Rfl:    Cholecalciferol (VITAMIN D3) 25 MCG (1000 UT) CAPS, Take 1,000 Units by mouth daily. , Disp: ,  Rfl:    cimetidine  (TAGAMET ) 200 MG tablet, Take 200 mg by mouth 2 (two) times daily., Disp: , Rfl:    conjugated estrogens  (PREMARIN ) vaginal cream, Place vaginally 2 (two) times a week., Disp: 30 g, Rfl: 3   gabapentin  (NEURONTIN ) 300 MG capsule, Take 300 mg by mouth at bedtime., Disp: , Rfl:    loratadine (CLARITIN) 10 MG tablet, Take 10 mg by mouth daily., Disp: , Rfl:    lovastatin  (MEVACOR ) 40 MG tablet, Take 1 tablet (40 mg total) by mouth at bedtime. Further refills to be authorized at appointment. Thank you!, Disp: 90 tablet, Rfl: 3   methylPREDNISolone  (MEDROL  DOSEPAK) 4 MG TBPK tablet, 6 day taper; take as directed on package instructions, Disp: 21 tablet, Rfl: 0   NYAMYC  powder, APPLY SMALL AMOUNT TOPICALLY EVERY DAY AS NEEDED., Disp: 60 g, Rfl: 1   nystatin  (MYCOSTATIN /NYSTOP ) powder, Apply 1 Application topically 2 (two) times daily., Disp: 60 g, Rfl: 1   phenazopyridine  (PYRIDIUM ) 100 MG tablet, Take 1 tablet (100 mg total) by mouth 3 (three) times daily as needed for pain., Disp: 10 tablet, Rfl: 0   PREMARIN  0.625 MG tablet, TAKE 1 TABLET  BY MOUTH DAILY, Disp: 90 tablet, Rfl: 0   tiZANidine  (ZANAFLEX ) 4 MG tablet, Take 4 mg by mouth in the morning and at bedtime., Disp: , Rfl:   Observations/Objective: Patient is well-developed, well-nourished in no acute distress.  Resting comfortably  at home.  Head is normocephalic, atraumatic.  No labored breathing.  Speech is clear and coherent with logical content.  Patient is alert and oriented at baseline.    Assessment and Plan: 1. Dysuria (Primary)  Advised her repeatedly she would need to be seen in person. REcommended Urgent Care since she does not have pcp anymore - hasn't seen pcp in 6 years. Tried to show [facility] [place].   Follow Up Instructions: I discussed the assessment and treatment plan with the patient. The patient was provided an opportunity to ask questions and all were  answered. The patient agreed with the plan and demonstrated an understanding of the instructions.  A copy of instructions were sent to the patient via MyChart unless otherwise noted below.   The patient was advised to call back or seek an in-person evaluation if the symptoms worsen or if the condition fails to improve as anticipated.    Blinda Burger, NP

## 2024-06-07 ENCOUNTER — Telehealth: Admitting: Physician Assistant

## 2024-06-07 DIAGNOSIS — R3989 Other symptoms and signs involving the genitourinary system: Secondary | ICD-10-CM

## 2024-06-07 MED ORDER — CEPHALEXIN 500 MG PO CAPS: 500.0000 mg | ORAL_CAPSULE | Freq: Two times a day (BID) | ORAL | 0 refills | Status: AC

## 2024-06-07 NOTE — Patient Instructions (Signed)
 Verneita JAYSON Staple, thank you for joining [name], PA-C for today's virtual visit.  While this provider is not your primary care provider (PCP), if your PCP is located in our provider database this encounter information will be shared with them immediately following your visit.   A [facility] MyChart account gives you access to today's visit and all your visits, tests, and labs performed at [facility]  click here if you don't have a [facility] MyChart account or go to [email or web address]  Consent: (Patient) Patricia Rollins provided verbal consent for this virtual visit at the beginning of the encounter.  Current Medications:  Current Outpatient Medications:    ALPRAZolam  (XANAX ) 1 MG tablet, Take 1 mg by mouth 3 (three) times daily. , Disp: , Rfl:    amphetamine-dextroamphetamine (ADDERALL) 10 MG tablet, Take 10 mg by mouth 2 (two) times daily., Disp: , Rfl:    aspirin EC 81 MG tablet, Take 81 mg by mouth daily. , Disp: , Rfl:    Cholecalciferol (VITAMIN D3) 25 MCG (1000 UT) CAPS, Take 1,000 Units by mouth daily. , Disp: , Rfl:    cimetidine  (TAGAMET ) 200 MG tablet, Take 200 mg by mouth 2 (two) times daily., Disp: , Rfl:    conjugated estrogens  (PREMARIN ) vaginal cream, Place vaginally 2 (two) times a week., Disp: 30 g, Rfl: 3   gabapentin  (NEURONTIN ) 300 MG capsule, Take 300 mg by mouth at bedtime., Disp: , Rfl:    loratadine (CLARITIN) 10 MG tablet, Take 10 mg by mouth daily., Disp: , Rfl:    lovastatin  (MEVACOR ) 40 MG tablet, Take 1 tablet (40 mg total) by mouth at bedtime. Further refills to be authorized at appointment. Thank you!, Disp: 90 tablet, Rfl: 3   methylPREDNISolone  (MEDROL  DOSEPAK) 4 MG TBPK tablet, 6 day taper; take as directed on package instructions, Disp: 21 tablet, Rfl: 0   NYAMYC  powder, APPLY SMALL AMOUNT TOPICALLY EVERY DAY AS NEEDED., Disp: 60 g, Rfl: 1   nystatin  (MYCOSTATIN /NYSTOP ) powder, Apply 1 Application topically 2 (two) times daily.,  Disp: 60 g, Rfl: 1   phenazopyridine  (PYRIDIUM ) 100 MG tablet, Take 1 tablet (100 mg total) by mouth 3 (three) times daily as needed for pain., Disp: 10 tablet, Rfl: 0   PREMARIN  0.625 MG tablet, TAKE 1 TABLET BY MOUTH DAILY, Disp: 90 tablet, Rfl: 0   tiZANidine  (ZANAFLEX ) 4 MG tablet, Take 4 mg by mouth in the morning and at bedtime., Disp: , Rfl:    Medications ordered in this encounter:  No orders of the defined types were placed in this encounter.    *If you need refills on other medications prior to your next appointment, please contact your pharmacy*  Follow-Up: Call back or seek an in-person evaluation if the symptoms worsen or if the condition fails to improve as anticipated.  [facility] [phone]  Other Instructions Please report to the nearest Emergency room with any worsening symptoms. Follow up with primary care provider (PCP) in 2 -3 days.   If you have been instructed to have an in-person evaluation today at a local Urgent Care facility, please use the link below. It will take you to a list of all of our available [facility], including address, phone number and hours of operation. Please do not delay care.  [facility]  If you or a family member do not have a primary care provider, use the link below to schedule a visit and establish care. When  you choose a [facility] primary care physician or advanced practice provider, you gain a long-term partner in health. Find a Primary Care Provider  Learn more about [facility]'s in-office and virtual care options: [facility] - Get Care Now

## 2024-06-07 NOTE — Progress Notes (Signed)
 Virtual Visit Consent   Patricia Rollins, you are scheduled for a virtual visit with a [facility] provider today. Just as with appointments in the office, your consent must be obtained to participate. Your consent will be active for this visit and any virtual visit you may have with one of our providers in the next 365 days. If you have a MyChart account, a copy of this consent can be sent to you electronically.  As this is a virtual visit, video technology does not allow for your provider to perform a traditional examination. This may limit your provider's ability to fully assess your condition. If your provider identifies any concerns that need to be evaluated in person or the need to arrange testing (such as labs, EKG, etc.), we will make arrangements to do so. Although advances in technology are sophisticated, we cannot ensure that it will always work on either your end or our end. If the connection with a video visit is poor, the visit may have to be switched to a telephone visit. With either a video or telephone visit, we are not always able to ensure that we have a secure connection.  By engaging in this virtual visit, you consent to the provision of healthcare and authorize for your insurance to be billed (if applicable) for the services provided during this visit. Depending on your insurance coverage, you may receive a charge related to this service.  I need to obtain your verbal consent now. Are you willing to proceed with your visit today? Patricia Rollins has provided verbal consent on 06/07/2024 for a virtual visit (video or telephone). Patricia Rollins, [place]  Date: 06/07/2024 5:21 PM   Virtual Visit via Video Note   I, Patricia Rollins, connected with  Patricia Rollins  ([id number], Nov 11, 1961) on 06/07/24 at  5:15 PM EDT by a video-enabled telemedicine application and verified that I am speaking with the correct person using two identifiers.  Location: Patient: Virtual Visit Location Patient:  Home Provider: Virtual Visit Location Provider: Home Office   I discussed the limitations of evaluation and management by telemedicine and the availability of in person appointments. The patient expressed understanding and agreed to proceed.    History of Present Illness: Patricia Rollins is a 63 y.o. who identifies as a female who was assigned female at birth, and is being seen today for UTI .  HPI: Dysuria  This is a new problem. The current episode started 1 to 4 weeks ago. The problem occurs every urination. The problem has been waxing and waning. The patient is experiencing no pain. There has been no fever. Associated symptoms include frequency, hesitancy and urgency. Pertinent negatives include no flank pain. She has tried home medications for the symptoms. The treatment provided no relief.    Problems:  Patient Active Problem List   Diagnosis Date Noted   No-show for appointment 09/27/2020   Incisional hernia, without obstruction or gangrene 01/07/2020   Vaginal atrophy 06/28/2017   Coronary artery disease involving native coronary artery of native heart 03/06/2017   Stable angina pectoris (HCC) 03/06/2017   Renal infarct (HCC) 02/26/2017   Abdominal pain 02/26/2017   Midline thoracic back pain 02/01/2017   LUQ abdominal tenderness 01/03/2017   Renal lesion 01/03/2017   Spinal stenosis of lumbar region 12/25/2016   Epigastric abdominal pain 11/21/2016   Constipation 10/23/2016   Lipoma of chest wall 10/23/2016   Coronary artery disease involving coronary bypass graft of native heart without angina pectoris 09/13/2016  Aortic atherosclerosis (HCC) 09/13/2016   PAD (peripheral artery disease) (HCC) 09/13/2016   Pure hypercholesterolemia 09/13/2016   Marijuana use 08/09/2016   Chest pain of uncertain etiology 07/26/2016   Chronic lower extremity pain (Bilateral) (L>R) 07/26/2016   Chronic upper extremity pain (Bilateral) (L>R) 07/26/2016   Tobacco use disorder 07/25/2016    Cigarette nicotine  dependence 07/25/2016   Lumbar facet syndrome (Bilateral) (R>L) 07/25/2016   Chronic sacroiliac joint pain (Bilateral) (R>L) 07/25/2016   Chronic shoulder pain (Left) 07/25/2016   Peripheral neuropathy (HCC) (lower extremity) (Bilateral) (L>R) 07/25/2016   Neuropathic pain 07/25/2016   Musculoskeletal pain 07/25/2016   Chronic neck pain (Bilateral) (L>R) 07/25/2016   Long term current use of opiate analgesic 07/24/2016   Encounter for therapeutic drug level monitoring 07/24/2016   Encounter for pain management planning 07/24/2016   Long term prescription benzodiazepine use 07/24/2016   Chronic hip pain (Location of Primary Source of Pain) (Bilateral) (R>L) 07/24/2016   Annual physical exam 07/18/2016   Colon cancer screening 07/18/2016   Tongue lesion 06/21/2016   Abnormal mammogram 11/08/2015   Trochanteric bursitis (Bilateral) 08/16/2015   Hot flash, menopausal 08/16/2015   Compulsive tobacco user syndrome 08/16/2015   Chronic low back pain (Location of Secondary source of pain) (Bilateral) (R>L) 08/15/2015   CD (contact dermatitis) 08/15/2015   Menopausal and perimenopausal disorder 08/15/2015   Chronic lumbar pain 05/12/2015   Fibromyalgia 05/12/2015   Generalized anxiety disorder 05/12/2015   Chronic pain disorder 07/12/2010   Lumbosacral neuritis 02/23/2010    Allergies:  Allergies  Allergen Reactions   Fluoxetine Other (See Comments)    Confusion    Other Nausea Only and Other (See Comments)    Other reaction(s): Unknown    Sulfa Antibiotics Nausea Only   Sulfasalazine Nausea Only   Medications:  Current Outpatient Medications:    ALPRAZolam  (XANAX ) 1 MG tablet, Take 1 mg by mouth 3 (three) times daily. , Disp: , Rfl:    amphetamine-dextroamphetamine (ADDERALL) 10 MG tablet, Take 10 mg by mouth 2 (two) times daily., Disp: , Rfl:    aspirin EC 81 MG tablet, Take 81 mg by mouth daily. , Disp: , Rfl:    Cholecalciferol (VITAMIN D3) 25 MCG (1000 UT)  CAPS, Take 1,000 Units by mouth daily. , Disp: , Rfl:    cimetidine  (TAGAMET ) 200 MG tablet, Take 200 mg by mouth 2 (two) times daily., Disp: , Rfl:    conjugated estrogens  (PREMARIN ) vaginal cream, Place vaginally 2 (two) times a week., Disp: 30 g, Rfl: 3   gabapentin  (NEURONTIN ) 300 MG capsule, Take 300 mg by mouth at bedtime., Disp: , Rfl:    loratadine (CLARITIN) 10 MG tablet, Take 10 mg by mouth daily., Disp: , Rfl:    lovastatin  (MEVACOR ) 40 MG tablet, Take 1 tablet (40 mg total) by mouth at bedtime. Further refills to be authorized at appointment. Thank you!, Disp: 90 tablet, Rfl: 3   methylPREDNISolone  (MEDROL  DOSEPAK) 4 MG TBPK tablet, 6 day taper; take as directed on package instructions, Disp: 21 tablet, Rfl: 0   NYAMYC  powder, APPLY SMALL AMOUNT TOPICALLY EVERY DAY AS NEEDED., Disp: 60 g, Rfl: 1   nystatin  (MYCOSTATIN /NYSTOP ) powder, Apply 1 Application topically 2 (two) times daily., Disp: 60 g, Rfl: 1   phenazopyridine  (PYRIDIUM ) 100 MG tablet, Take 1 tablet (100 mg total) by mouth 3 (three) times daily as needed for pain., Disp: 10 tablet, Rfl: 0   PREMARIN  0.625 MG tablet, TAKE 1 TABLET BY MOUTH DAILY, Disp: 90 tablet,  Rfl: 0   tiZANidine  (ZANAFLEX ) 4 MG tablet, Take 4 mg by mouth in the morning and at bedtime., Disp: , Rfl:   Observations/Objective: Patient is well-developed, well-nourished in no acute distress.  Resting comfortably  at home.  Head is normocephalic, atraumatic.  No labored breathing.  Speech is clear and coherent with logical content.  Patient is alert and oriented at baseline.    Assessment and Plan: 1. Suspected UTI (Primary)  Patient presenting with vaginal irritation most likely UTI.   I also considered PID, pregnancy, ectopic pregnancy, UTI, BV, endometriosis,  tubovarian abscess, appendicitis, yeast vagnitis,  and pyelonephritis, but this appears less likely considering the data gathered thus far.  Medication prescribed. I have instructed the patient  to present to the nearest ER at any time if there are any new or worsening symptoms.  The patient expressed understanding of and agreement with this plan.  Opportunity was given for questions prior to discharge and all stated questions were answered to the patient's satisfaction.   Follow Up Instructions: I discussed the assessment and treatment plan with the patient. The patient was provided an opportunity to ask questions and all were answered. The patient agreed with the plan and demonstrated an understanding of the instructions.  A copy of instructions were sent to the patient via MyChart unless otherwise noted below.      The patient was advised to call back or seek an in-person evaluation if the symptoms worsen or if the condition fails to improve as anticipated.    Patricia Shuck, PA-C

## 2024-09-12 NOTE — ED Provider Notes (Addendum)
 "    [facility] EMERGENCY DEPARTMENT ENCOUNTER     ED Clinical Impression    Final diagnoses:  Polysubstance use disorder  Altered mental status, unspecified altered mental status type  Evaluation by psychiatric service required (Primary)  Involuntary commitment      Impression, ED Course, Assessment and Plan    Impression: This is a 63 y.o. female with a history of polysubstance use disorder who presents to [facility] emergency department under involuntary commitment filed by her sons Hezzie and Lamar ).  Her sons are concerned that she has been using and abusing drugs for 10 years, has not held a job during that time, they are concerned she is going to overdose.  They are concerned she is at acutely elevated risk of harm to self and others because she will routinely pass out with lit cigarettes, lit candles in the house.  She has apparently been evicted.  This is the son's last ditch effort, they say it is awful and it was difficult for them to IVC her.  But they would like for her to get some help.  At this time, I am most concerned for polysubstance use disorder, involuntary commitment by family member.  There is lower concern for an acute medical or surgical problem at this time and appropriate medical screening workup has been ordered.  No recent fevers or chills to suggest infectious etiology such as meningitis/encephalitis.  Less likely toxidrome, metabolic, and/or endocrine cause of patient's presenting illness.   The patient was placed on a precautionary hold given the concerning nature of the presentation.  The plan will include psychiatry consultation.  I have ordered some of the patient's home medications so that patient does experience significant withdrawal here in the ED.  ED Treatment: Medications  tizanidine  (ZANAFLEX ) tablet 4 mg (has no administration in time range)  ALPRAZolam  (XANAX ) tablet 1 mg (has no administration in time range)  nicotine  (NICODERM CQ ) 14  mg/24 hr patch 1 patch (has no administration in time range)  nicotine  polacrilex (NICORETTE) gum 2 mg (has no administration in time range)    ED COURSE: Medical screening workup is remarkable for mildly elevated salicylate level at 30.5.  High normal is 30.0.  Patient denies salicylate overdose.  Furthermore, no sign of acute kidney injury.  Will order repeat salicylate level for the morning. Urinalysis and urine drug screen are pending. The patient is medically clear. Appreciate Psychiatry evaluation and recommendations.  Disposition: IVC, Admit to ED observation    Additional Medical Decision Making   I have reviewed the vital signs and the nursing notes. Labs and radiology results that were available during my care of the patient were independently reviewed by me and considered in my medical decision making.   I directly visualized and independently interpreted the EKG tracing.  I reviewed the patient's prior medical records (prior psych note, family medicine note from [facility] health).  I discussed the case with the patient's son, Selinda.  I obtained additional history from patient's son.   Portions of this record have been created using Scientist, clinical (histocompatibility and immunogenetics). Dictation errors have been sought, but may not have been identified and corrected. ____________________________________________      History     Chief Complaint Mental Health Evaluation (Arrived on IVC via [facility].)   HPI  Patricia Rollins is a 63 y.o. female with past medical history of fibromyalgia, CAD, PAD, HLD, tobacco use disorder who presents to the [facility] emergency department  under involuntary commitment.  The patient's son, Lamar Spare, filed the IVC paperwork.  His phone number is [phone].  He reports his mother has mental illness and is dangerous to self or others.  Is a substance abuser and dangerous to self or others.  He states she has been committed before, mental  illness, talks to herself.  Forgets where she is, whether it is day or nighttime.  Back and forth personalities during conversations.  Loses all control of emotions and has outburst.  Leaves candles and lit cigarettes everywhere inside the home and forgets.  Dangerous to herself.  Known pills are Adderall and Xanax .  Is a substance abuser and addicted to pills.  She crushes pills on countertops, smokes weed as well.  She passes out with lit cigarettes on her.  Dangerous to self and others.  The patient states that she came for a visit from [place], [place]  and stayed with her younger son Beryl and his wife. She states that she has not been in contact with her son Selinda for 2.5 years. The patient is unable to provide a coherent reason for her visit to this hospital. She denies suicidal ideation, homicidal ideation, and auditory/visual hallucinations. The patient states she has recently tried Escitalopram for about one month but discontinued due to side effects. She states that she is currently taking generic Xanax , Adderall, generic Claritin, vitamin D , tizanidine , and Tagamet . The patient endorses regular use of CBD edible products. She endorses chronic, intermittent abdominal pain. The patient states that her family has access to her credit cards and other financial information right now.    Past Medical History[1]  Problem List[2]  Past Surgical History[3]   Current Facility-Administered Medications:    ALPRAZolam  (XANAX ) tablet 1 mg, 1 mg, Oral, TID, Hancher, Dorn Righter, MD   [START ON 09/13/2024] nicotine  (NICODERM CQ ) 14 mg/24 hr patch 1 patch, 1 patch, Transdermal, Daily, Hancher, Dorn Righter, MD   nicotine  polacrilex (NICORETTE) gum 2 mg, 2 mg, Buccal, Q2H PRN, Hancher, Dorn Righter, MD   tizanidine  (ZANAFLEX ) tablet 4 mg, 4 mg, Oral, BID, Hancher, Dorn Righter, MD  Current Outpatient Medications:    ALPRAZolam  (XANAX ) 1 MG tablet, Take 1 mg by mouth., Disp: , Rfl:     aspirin 325 MG tablet, Take 325 mg by mouth., Disp: , Rfl:    bisacodyl (DULCOLAX) 5 mg EC tablet, Take 5 mg by mouth daily as needed for constipation., Disp: , Rfl:    estrogens , conjugated, (PREMARIN ) 0.625 MG tablet, Take 0.625 mg by mouth., Disp: , Rfl:    HYDROcodone -acetaminophen  (NORCO) 7.5-325 mg per tablet, Take 1 tablet by mouth., Disp: , Rfl:    sucralfate  (CARAFATE ) 1 gram tablet, Take 1 g by mouth., Disp: , Rfl:    tiZANidine  (ZANAFLEX ) 4 MG tablet, TAKE ONE TABLET EVERY 8 HOURS AS NEEDED, Disp: , Rfl:   Allergies Fluoxetine, Sulfa (sulfonamide antibiotics), and Sulfasalazine  Family History[4]  Social History Short Social History[5]  Review of Systems All other systems have been reviewed and are negative except as otherwise documented in the HPI.    Physical Exam   ED Triage Vitals [09/12/24 2012]  Enc Vitals Group     BP 135/56     Pulse 94     SpO2 Pulse      Resp 16     Temp 37 C (98.6 F)     Temp Source Oral     SpO2 100 %   Constitutional: In no acute respiratory distress.  Eyes: Conjunctivae are normal.  EOMI. HEENT:      Head: Normocephalic and atraumatic.      Nose: No epistaxis or congestion.      Mouth/Throat: Clear oropharynx. Moist mucous membranes. No stertor.      Neck: full range of motion, no stridor. Cardiovascular: Regular rate and rhythm. No murmurs, rubs or gallops. 2+ and symmetric radial pulses. Respiratory: No increased work of breathing. Clear to auscultation bilaterally. Gastrointestinal: Soft, non-tender, non-distended. Musculoskeletal: No ecchymosis, cyanosis or edema. Neurologic: Normal speech and language. No gross focal neurologic deficits are appreciated. Skin: Skin is warm, dry and intact. No acute rash noted. Psychiatric: Patient denies SI, HI, and AVH. Mood and affect are normal. Speech and behavior are normal.   EKG   Encounter Date: 09/12/24  ECG 12 Lead  Result Value   EKG Systolic BP    EKG Diastolic BP     EKG Ventricular Rate 71   EKG Atrial Rate 71   EKG P-R Interval 158   EKG QRS Duration 72   EKG Q-T Interval 386   EKG QTC Calculation 419   EKG Calculated P Axis 73   EKG Calculated R Axis 263   EKG Calculated T Axis 33   QTC Fredericia 408   Narrative   NORMAL SINUS RHYTHM RIGHT SUPERIOR AXIS DEVIATION ANTEROSEPTAL INFARCT , AGE UNDETERMINED ABNORMAL ECG NO PREVIOUS ECGS AVAILABLE Confirmed by Melecio Carrier ([id number]) on 09/12/2024 10:53:31 PM     Laboratory Data   Results for orders placed or performed during the hospital encounter of 09/12/24  COVID PCR   Specimen: Nasopharyngeal Swab  Result Value Ref Range   SARS-CoV-2 PCR Negative Negative  Comprehensive Metabolic Panel  Result Value Ref Range   Sodium 143 136 - 145 mmol/L   Potassium 3.3 (L) 3.5 - 5.1 mmol/L   Chloride 105 98 - 107 mmol/L   CO2 24.6 20.0 - 31.0 mmol/L   Anion Gap 13 (H) 3 - 11 mmol/L   BUN 16 9 - 23 mg/dL   Creatinine 9.47 9.49 - 0.80 mg/dL   BUN/Creatinine Ratio 31    eGFR CKD-EPI (2021) Female >90 >=60 mL/min/1.33m2   Glucose 104 70 - 179 mg/dL   Calcium  8.8 8.7 - 10.4 mg/dL   Albumin 3.3 (L) 3.4 - 5.0 g/dL   Total Protein 7.3 5.7 - 8.2 g/dL   Total Bilirubin 0.3 0.3 - 1.2 mg/dL   AST 18 13 - 40 U/L   ALT 18 7 - 40 U/L   Alkaline Phosphatase 71 46 - 116 U/L  Salicylate level  Result Value Ref Range   Salicylate Lvl 30.5 (HH) 0.0 - 30.0 mg/dL  Acetaminophen  level  Result Value Ref Range   Acetaminophen  Level <2.0 (L) 10.0 - 20.0 ug/ml  TSH  Result Value Ref Range   TSH 1.717 0.550 - 4.780 uIU/mL  Ethanol  Result Value Ref Range   Alcohol, Ethyl <3 <=10 mg/dL  ECG 12 Lead  Result Value Ref Range   EKG Systolic BP  mmHg   EKG Diastolic BP  mmHg   EKG Ventricular Rate 71 BPM   EKG Atrial Rate 71 BPM   EKG P-R Interval 158 ms   EKG QRS Duration 72 ms   EKG Q-T Interval 386 ms   EKG QTC Calculation 419 ms   EKG Calculated P Axis 73 degrees   EKG Calculated R Axis 263 degrees    EKG Calculated T Axis 33 degrees   QTC Fredericia 408 ms  CBC w/ Differential  Result Value Ref Range   WBC 8.1 3.6 - 11.2 10*9/L   RBC 3.87 (L) 3.95 - 5.13 10*12/L   HGB 13.0 11.3 - 14.9 g/dL   HCT 62.3 65.9 - 55.9 %   MCV 97.1 (H) 77.6 - 95.7 fL   MCH 33.6 (H) 25.9 - 32.4 pg   MCHC 34.6 32.0 - 36.0 g/dL   RDW 86.2 87.7 - 84.7 %   MPV 8.0 6.8 - 10.7 fL   Platelet 385 150 - 450 10*9/L   Neutrophils % 53.3 %   Lymphocytes % 33.6 %   Monocytes % 9.9 %   Eosinophils % 2.0 %   Basophils % 1.2 %   Absolute Neutrophils 4.3 1.8 - 7.8 10*9/L   Absolute Lymphocytes 2.7 1.1 - 3.6 10*9/L   Absolute Monocytes 0.8 0.3 - 0.8 10*9/L   Absolute Eosinophils 0.2 0.0 - 0.5 10*9/L   Absolute Basophils 0.1 0.0 - 0.1 10*9/L      Hancher, Dorn Righter, MD 09/12/24 2254       [1] Past Medical History: Diagnosis Date   Anxiety    Arthritis    Back pain    CAD (coronary artery disease)    Depression (emotion)    Fibromyalgia    Hematuria    HLD (hyperlipidemia)    Neuromuscular disorder    (CMS-HCC)    PAD (peripheral artery disease)    Tobacco abuse   [2] Patient Active Problem List Diagnosis   Abnormal mammogram   Acute constipation   Annual physical exam   Aortic atherosclerosis   CD (contact dermatitis)   Chest pain of uncertain etiology   Chronic hip pain   Chronic low back pain   Chronic pain of lower extremity   Chronic lumbar pain   Chronic neck pain   Chronic pain   Chronic pain disorder   Chronic sacroiliac joint pain   Chronic left shoulder pain   Chronic upper extremity pain   Cigarette nicotine  dependence   Colon cancer screening   Compulsive tobacco user syndrome   Coronary artery disease involving coronary bypass graft of native heart without angina pectoris   Encounter for pain management planning   Encounter for therapeutic drug level monitoring   Epigastric abdominal pain   Fibromyalgia   Generalized anxiety  disorder   Hot flash, menopausal   Lipoma of chest wall   Long term current use of opiate analgesic   Long term prescription benzodiazepine use   Long term prescription opiate use   Facet syndrome, lumbar   Trochanteric bursitis of both hips   Tongue lesion   Smoker   Pure hypercholesterolemia   Peripheral neuropathy   PAD (peripheral artery disease)   Opiate use   Neuropathic pain   Neurogenic pain   Musculoskeletal pain   Menopausal symptom   Menopausal and perimenopausal disorder   Marijuana use   Lumbosacral neuritis  [3] Past Surgical History: Procedure Laterality Date   APPENDECTOMY     BREAST BIOPSY Left    HYSTERECTOMY     OOPHORECTOMY     TONSILLECTOMY    [4] Family History Problem Relation Age of Onset   Ovarian cancer Mother    Pancreatic cancer Mother   [5] Social History Tobacco Use   Smoking status: Every Day    Current packs/day: 1.00    Types: Cigarettes   Smokeless tobacco: Never  Substance Use Topics   Alcohol use: No   Drug use: Yes  Types: Marijuana   Hancher, Dorn Righter, MD 09/12/24 2355  "

## 2024-09-13 NOTE — ED Provider Notes (Signed)
 ED Psychiatric Observation Progress Note  This is a psychiatric observation progress note for patient who is observed in the emergency department awaiting psychiatric placement.  Events were reviewed from the overnight shift.  There were no acute issues.  We will continue to monitor the patient here in the emergency department, psych to see soon.  Physical Exam: BP 144/83   Pulse 83   Temp 36.9 C (98.4 F) (Oral)   Resp 16   SpO2 100%   Patient resting comfortably.

## 2024-09-14 NOTE — ED Provider Notes (Signed)
 ED Psychiatric Observation Progress Note  This is a psychiatric observation progress note for patient who is observed in the emergency department awaiting psychiatric placement.  Events were reviewed from the overnight shift.  There are no acute issues.  Patient is currently under the management of the psychiatric service.  We will continue to monitor the patient here in the emergency department and await placement in an appropriate psychiatric facility.  Physical Exam: BP 144/83   Pulse 83   Temp 36.9 C (98.4 F) (Oral)   Resp 16   SpO2 100%   Patient was previously medically evaluated and deemed suitable for psychiatric disposition.  Per psychiatry note from today:  Continue IVC, olanzapine twice daily if needed for agitation and at bedtime, clonazepam 3 times daily

## 2024-09-15 NOTE — ED Provider Notes (Signed)
 ED Psychiatric Discharge Summary  Final Diagnosis:  Diagnosis ICD-10-CM Associated Orders  1. Evaluation by psychiatric service required  Z00.8     2. Polysubstance use disorder  F19.90     3. Altered mental status, unspecified altered mental status type  R41.82     4. Involuntary commitment  Z04.6        Procedures:  None  ED Course: 63 y.o. female that presented with psychosis. Placed on IVC for safety. Medicaly stable for psychiatric disposition.  Patient was managed jointly with psychiatry team.  We observed the patient here in the emergency department until placement was secured in an appropriate psychiatric facility .   Pertinent Exam Findings: Vitals:   09/15/24 1643  BP: 120/75  Pulse: 74  Resp:   Temp:   SpO2: 97%     Psych: Mood: stable  Labs: Labs Reviewed  COMPREHENSIVE METABOLIC PANEL - Abnormal; Notable for the following components:      Result Value   Potassium 3.3 (*)    Anion Gap 13 (*)    Albumin 3.3 (*)    All other components within normal limits  URINALYSIS WITH MICROSCOPY - Abnormal; Notable for the following components:   Blood, UA 1+ (*)    All other components within normal limits  SALICYLATE LEVEL - Abnormal; Notable for the following components:   Salicylate Lvl 30.5 (*)    All other components within normal limits  ACETAMINOPHEN  LEVEL - Abnormal; Notable for the following components:   Acetaminophen  Level <2.0 (*)    All other components within normal limits  DRUG SCREEN, URINE - Abnormal; Notable for the following components:   Amphetamines Screen, Ur Positive (*)    Benzodiazepines Screen, Urine Positive (*)    Cannabinoids Screen, Ur Positive (*)    All other components within normal limits   Narrative:    Urine Drugs of Abuse screening results are not confirmed and are for medical treatment only. This drug screen is not to be used for non-medical purposes (e.g. legal testing, employment testing).  CBC W/ AUTO DIFF - Abnormal;  Notable for the following components:   RBC 3.87 (*)    MCV 97.1 (*)    MCH 33.6 (*)    All other components within normal limits  COVID-19 PCR - Normal   Narrative:    Testing was performed using the FDA cleared Cepheid SARS-CoV-2 plus. Performance characteristics have been verified by [facility]. A negative result does not rule out the possibility of infection and should be used in conjunction with other clinical findings as well as patient history.  TSH - Normal  ETHANOL - Normal  SALICYLATE LEVEL - Normal  CBC W/ DIFFERENTIAL   Narrative:    The following orders were created for panel order CBC w/ Differential. Procedure                               Abnormality         Status                    ---------                               -----------         ------  CBC w/ [phone]]         Abnormal            Final result               Please view results for these tests on the individual orders.     Disposition/Plan:   Transferred to [facility] inpatient psychiatric facility.

## 2024-09-15 NOTE — ED Provider Notes (Signed)
 Involuntary Commitment  This is an ongoing progress note for this patient who is boarding in the emergency department under IVC paperwork, awaiting inpatient psychiatric placement.  Events were reviewed from the overnight shift.  There are no acute medical issues. Psychiatric care is under the direction of the psychiatry service.     BP 119/69   Pulse 59   Temp 36.4 C (97.6 F) (Oral)   Resp 16   SpO2 96%   Current Medications[1]  Pt encouraged to ambulate for DVT prevention. We will continue to monitor the patient here in the emergency department and await placement in an appropriate psychiatric facility.       [1]  Current Facility-Administered Medications:    clonazePAM (KlonoPIN) tablet 2 mg, 2 mg, Oral, TID, Baruch Bernardino Mungo, MD, 2 mg at 09/14/24 2154   ibuprofen (ADVIL,MOTRIN) tablet 400 mg, 400 mg, Oral, Q6H PRN, Cleotilde Elida Duncan, MD, 400 mg at 09/14/24 2153   nicotine  (NICODERM CQ ) 14 mg/24 hr patch 1 patch, 1 patch, Transdermal, Daily, Hancher, Dorn Righter, MD   nicotine  polacrilex (NICORETTE) gum 2 mg, 2 mg, Buccal, Q2H PRN, Hancher, Dorn Righter, MD   OLANZapine (ZYPREXA) tablet 2.5 mg, 2.5 mg, Oral, BID PRN, Baruch Bernardino Mungo, MD   OLANZapine zydis (ZYPREXA) disintegrating tablet 5 mg, 5 mg, Oral, At bedtime, Baruch Bernardino Mungo, MD, 5 mg at 09/14/24 2153   tizanidine  (ZANAFLEX ) tablet 4 mg, 4 mg, Oral, BID, Hancher, Dorn Righter, MD, 4 mg at 09/14/24 1416  Current Outpatient Medications:    ALPRAZolam  (XANAX ) 1 MG tablet, Take 1 mg by mouth., Disp: , Rfl:    aspirin 325 MG tablet, Take 325 mg by mouth., Disp: , Rfl:    bisacodyl (DULCOLAX) 5 mg EC tablet, Take 5 mg by mouth daily as needed for constipation., Disp: , Rfl:    estrogens , conjugated, (PREMARIN ) 0.625 MG tablet, Take 0.625 mg by mouth., Disp: , Rfl:    HYDROcodone -acetaminophen  (NORCO) 7.5-325 mg per tablet, Take 1 tablet by mouth., Disp: , Rfl:    sucralfate  (CARAFATE ) 1 gram  tablet, Take 1 g by mouth., Disp: , Rfl:    tiZANidine  (ZANAFLEX ) 4 MG tablet, TAKE ONE TABLET EVERY 8 HOURS AS NEEDED, Disp: , Rfl:

## 2024-09-15 NOTE — Nursing Note (Signed)
" ° °  Care Management Reassessment   Estimated Discharge Date: TBD  Current Discharge Plan: Acute Inpatient Psychiatric Facility   Anticipated Changes: See notes below  Coordination of Care and Care Progression Notes:  CMA tasked to send out IVC referral statewide.  SW will continue to follow for coordination of inpatient psychiatric facility placement.     09/15/24 0918  Reassessment  Post Acute Facility needed at discharge? Yes  Post Acute Facility Psychiatric Facility  Reassessment Complete Yes    "

## 2024-09-15 NOTE — ED Notes (Signed)
 Pt's son, Beryl Spare [phone], contacted and notified of pt's transfer to [facility]. Pt did not want son on chart contacted Hezzie). Belongings sent with LEO at time of transfer.

## 2024-09-15 NOTE — ED Triage Notes (Signed)
 Pt accepted to [facility] by Dr. Kondal Madaram for admission today.  Nurse report to [phone] Opt 2, leave VM with call back information.

## 2024-09-15 NOTE — Progress Notes (Signed)
 "  [facility]  Psychiatry Follow-up   From Initial Consult note: Patricia Rollins is a 63 y.o., White race, Not Hispanic, Latino/a, or Spanish origin ethnicity,  ENGLISH speaking female  with a history of fibromyalgia, CAD, PAD, HLD, tobacco use disorder , who presents for evaluation of altered mental status.   Subjective/Events: Pt seen and evaluated via WebEx.   Pt feels groggy this morning and attributes this to zyprexa that she received last night. Mood okay and pt's main concern is waiting for effects of zyprexa to wear off. Feels hungover. Says she only takes 4 medications at home Xanax , Adderall, tizanidine , claritin.   Pt still suspicious of her son and does not want him to receive any updates. Pt specifically requests that psychiatry NOT reach out to him. She declines contact with any other friends or family.   Denies SI.   Sees a psychiatrist at [facility] in [place].   Medications: Current Medications[1]  Objective:  Physical Exam:  VS: Vital Signs Temp: 36.4 C (97.6 F) Temp Source: Oral Pulse: 59 SpO2 Pulse: 70 Heart Rate Source: NIBP Resp: 16 BP: 119/69 MAP (mmHg): 85 BP Location: Right arm BP Method: Automatic Patient Position: Sitting Mental Status Exam: Appearance:   Normal   Behavior:  Calm and cooperative   Motor:  Normal   Speech/Language:   Normal tone and volume   Mood:  Not anxious   Affect:  Mood congruent   Thought process:  Circumstantial   Thought content:    Exhibiting paranoia towards her son about some money  Perceptual disturbances:    No AVH reported    Orientation:  Oriented x 3  Attention:  Able to pay attention  Concentration:  Not distractible   Memory:  intact   Fund of knowledge:   Intact and appropriate   Insight:    Limited   Judgment:   Limited   Impulse Control:  Limited      Test Results: Data Review: Lab results last 24 hours:   No results found for this or any previous visit (from the  past 24 hours).     Assessment:  Per chart review and initial telepsych evaluation, patient's presentation is most consistent with psychosis nos and polysubstance abuse.  Pt alleges sons are trying to steal from her and are lying about her history and presentation. All three of pt's children report pt is homeless and has been abusing medications and had altered mental status over the last few month but has worsened even more over the last few days.  She has alleged robots are breaking into her home and the [facility] police department is in on it.  She alleges her daughter has hacked her phone to steal money.  At this time, I feel pt is an imminent risk to herself based on her paranoia, lack of insight, and unsafe use of medications resulting in elevated salicylate level.  She is in need of inpatient psychiatric hospitalization and continuation of her IVC.     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       Plan: -- decrease olanzapine to 2.5 mg nightly.  Olanzapine 2.5 mg po bid prn agitation and psychosis --  clonazepam 2 mg po tid.    IVC Status:  This patient DOES meet IVC criteria given presence of mental illness and evidence of acute or imminent dangerousness to self or others. -- Per review  of records, patient has been placed under petition. 1st Qualified Physician's Exam (QPE) should be completed by qualified provider.. -- Specific behaviors that justify IVC include: overuse of medication to a dangerous level, lack of insight, paranoid delusions  Rocky Sprang, MD  [facility]  The coverage schedule is available on Amion (login: [facility])   The patient reports they are physically located in [place]  and is currently: not at home. I conducted a audio/video visit. I spent 15 minutes on the video call with the patient. I spent an additional 15 minutes on pre-  and post-visit activities on the date of service .    Attestation: This encounter was completed as a video telehealth visit (two-way, real-time, interactive audio and video). I identified myself and my credentials/specialty, and verified the patient and I could see/hear each other clearly, the patient's name and location (which was [facility] ), and who else was in the room ([facility]) and that the patient requested or consented to their presence. We mutually agreed a telehealth visit was appropriate to the patient's circumstances/symptoms. Note to billing office: Modifer GT and Place of Service (POS) code 02 may apply. If the claim is related to COVID-19, modifier CR may apply.        [1] Current Facility-Administered Medications  Medication Dose Route Frequency Provider Last Rate Last Admin   clonazePAM (KlonoPIN) tablet 2 mg  2 mg Oral TID Baruch Bernardino Mungo, MD   2 mg at 09/15/24 1038   ibuprofen (ADVIL,MOTRIN) tablet 400 mg  400 mg Oral Q6H PRN Cleotilde Elida Duncan, MD   400 mg at 09/14/24 2153   loratadine (CLARITIN) tablet 10 mg  10 mg Oral Once Hancher, Dorn Righter, MD       nicotine  (NICODERM CQ ) 14 mg/24 hr patch 1 patch  1 patch Transdermal Daily Hancher, Dorn Righter, MD       nicotine  polacrilex (NICORETTE) gum 2 mg  2 mg Buccal Q2H PRN Hancher, Dorn Righter, MD       OLANZapine (ZYPREXA) tablet 2.5 mg  2.5 mg Oral BID PRN Baruch Bernardino Mungo, MD       OLANZapine zydis (ZYPREXA) disintegrating tablet 5 mg  5 mg Oral At bedtime Baruch Bernardino Mungo, MD   5 mg at 09/14/24 2153   tizanidine  (ZANAFLEX ) tablet 4 mg  4 mg Oral BID Hancher, Dorn Righter, MD   4 mg at 09/15/24 1039   Current Outpatient Medications  Medication Sig Dispense Refill   ALPRAZolam  (XANAX ) 1 MG tablet Take 1 mg by mouth.     aspirin 325 MG tablet Take 325 mg by mouth.     bisacodyl (DULCOLAX) 5 mg EC tablet Take 5 mg by mouth daily as needed for constipation.     estrogens , conjugated,  (PREMARIN ) 0.625 MG tablet Take 0.625 mg by mouth.     HYDROcodone -acetaminophen  (NORCO) 7.5-325 mg per tablet Take 1 tablet by mouth.     sucralfate  (CARAFATE ) 1 gram tablet Take 1 g by mouth.     tiZANidine  (ZANAFLEX ) 4 MG tablet TAKE ONE TABLET EVERY 8 HOURS AS NEEDED    "

## 2024-11-16 NOTE — Progress Notes (Deleted)
" ° ° °  GYNECOLOGY PROGRESS NOTE  Subjective:    Patient ID: DEARI SESSLER, female    DOB: Aug 17, 1961, 64 y.o.   MRN: [id number]  HPI  Patient is a 64 y.o. [id number] female who presents for   {Common ambulatory SmartLinks:19316}  Review of Systems {ros; complete:30496}   Objective:   There were no vitals taken for this visit. There is no height or weight on file to calculate BMI. General appearance: {general exam:16600} Abdomen: {abdominal exam:16834} Pelvic: {pelvic exam:16852::cervix normal in appearance,external genitalia normal,no adnexal masses or tenderness,no cervical motion tenderness,rectovaginal septum normal,uterus normal size, shape, and consistency,vagina normal without discharge} Extremities: {extremity exam:5109} Neurologic: {neuro exam:17854}   Assessment:   No diagnosis found.   Plan:   There are no diagnoses linked to this encounter.    Damien Parsley, CNM [facility]

## 2024-11-17 ENCOUNTER — Ambulatory Visit: Admitting: Certified Nurse Midwife
